# Patient Record
Sex: Female | Born: 1935 | Race: White | Hispanic: No | Smoking: Never smoker
Health system: Southern US, Community
[De-identification: ages and names within clinical notes are randomized; demographics above are authoritative.]

## PROBLEM LIST (undated history)

## (undated) DIAGNOSIS — E079 Disorder of thyroid, unspecified: Secondary | ICD-10-CM

## (undated) DIAGNOSIS — I10 Essential (primary) hypertension: Secondary | ICD-10-CM

## (undated) HISTORY — PX: AORTIC VALVE REPAIR: SHX6306

## (undated) HISTORY — PX: ABDOMINAL HYSTERECTOMY: SHX81

## (undated) HISTORY — PX: APPENDECTOMY: SHX54

---

## 2020-03-21 ENCOUNTER — Emergency Department: Payer: Medicare Other

## 2020-03-21 ENCOUNTER — Inpatient Hospital Stay
Admission: AD | Admit: 2020-03-21 | Discharge: 2020-04-02 | DRG: 956 | Disposition: A | Discharge status: Skilled Nursing Facility | Payer: Medicare Other | Attending: Internal Medicine | Admitting: Internal Medicine

## 2020-03-21 ENCOUNTER — Other Ambulatory Visit: Payer: Self-pay

## 2020-03-21 DIAGNOSIS — I5031 Acute diastolic (congestive) heart failure: Secondary | ICD-10-CM | POA: Diagnosis not present

## 2020-03-21 DIAGNOSIS — R338 Other retention of urine: Secondary | ICD-10-CM | POA: Diagnosis not present

## 2020-03-21 DIAGNOSIS — E43 Unspecified severe protein-calorie malnutrition: Secondary | ICD-10-CM | POA: Diagnosis present

## 2020-03-21 DIAGNOSIS — Z20822 Contact with and (suspected) exposure to covid-19: Secondary | ICD-10-CM | POA: Diagnosis present

## 2020-03-21 DIAGNOSIS — N179 Acute kidney failure, unspecified: Secondary | ICD-10-CM | POA: Diagnosis not present

## 2020-03-21 DIAGNOSIS — N184 Chronic kidney disease, stage 4 (severe): Secondary | ICD-10-CM | POA: Diagnosis present

## 2020-03-21 DIAGNOSIS — I4891 Unspecified atrial fibrillation: Secondary | ICD-10-CM | POA: Diagnosis present

## 2020-03-21 DIAGNOSIS — R140 Abdominal distension (gaseous): Secondary | ICD-10-CM

## 2020-03-21 DIAGNOSIS — S32592A Other specified fracture of left pubis, initial encounter for closed fracture: Secondary | ICD-10-CM | POA: Diagnosis present

## 2020-03-21 DIAGNOSIS — R627 Adult failure to thrive: Secondary | ICD-10-CM | POA: Diagnosis present

## 2020-03-21 DIAGNOSIS — D72829 Elevated white blood cell count, unspecified: Secondary | ICD-10-CM | POA: Diagnosis not present

## 2020-03-21 DIAGNOSIS — Z79899 Other long term (current) drug therapy: Secondary | ICD-10-CM

## 2020-03-21 DIAGNOSIS — I5033 Acute on chronic diastolic (congestive) heart failure: Secondary | ICD-10-CM | POA: Diagnosis present

## 2020-03-21 DIAGNOSIS — R778 Other specified abnormalities of plasma proteins: Secondary | ICD-10-CM | POA: Diagnosis not present

## 2020-03-21 DIAGNOSIS — E039 Hypothyroidism, unspecified: Secondary | ICD-10-CM | POA: Diagnosis present

## 2020-03-21 DIAGNOSIS — Z7901 Long term (current) use of anticoagulants: Secondary | ICD-10-CM

## 2020-03-21 DIAGNOSIS — S72001A Fracture of unspecified part of neck of right femur, initial encounter for closed fracture: Secondary | ICD-10-CM | POA: Diagnosis not present

## 2020-03-21 DIAGNOSIS — E079 Disorder of thyroid, unspecified: Secondary | ICD-10-CM | POA: Diagnosis present

## 2020-03-21 DIAGNOSIS — G9341 Metabolic encephalopathy: Secondary | ICD-10-CM | POA: Diagnosis not present

## 2020-03-21 DIAGNOSIS — Z90710 Acquired absence of both cervix and uterus: Secondary | ICD-10-CM

## 2020-03-21 DIAGNOSIS — R8281 Pyuria: Secondary | ICD-10-CM | POA: Diagnosis not present

## 2020-03-21 DIAGNOSIS — Z515 Encounter for palliative care: Secondary | ICD-10-CM | POA: Diagnosis not present

## 2020-03-21 DIAGNOSIS — I130 Hypertensive heart and chronic kidney disease with heart failure and stage 1 through stage 4 chronic kidney disease, or unspecified chronic kidney disease: Secondary | ICD-10-CM | POA: Diagnosis present

## 2020-03-21 DIAGNOSIS — S7012XA Contusion of left thigh, initial encounter: Secondary | ICD-10-CM | POA: Diagnosis not present

## 2020-03-21 DIAGNOSIS — Z419 Encounter for procedure for purposes other than remedying health state, unspecified: Secondary | ICD-10-CM

## 2020-03-21 DIAGNOSIS — N189 Chronic kidney disease, unspecified: Secondary | ICD-10-CM | POA: Diagnosis not present

## 2020-03-21 DIAGNOSIS — I272 Pulmonary hypertension, unspecified: Secondary | ICD-10-CM | POA: Diagnosis not present

## 2020-03-21 DIAGNOSIS — W010XXA Fall on same level from slipping, tripping and stumbling without subsequent striking against object, initial encounter: Secondary | ICD-10-CM | POA: Diagnosis present

## 2020-03-21 DIAGNOSIS — Z953 Presence of xenogenic heart valve: Secondary | ICD-10-CM

## 2020-03-21 DIAGNOSIS — R0902 Hypoxemia: Secondary | ICD-10-CM

## 2020-03-21 DIAGNOSIS — I4819 Other persistent atrial fibrillation: Secondary | ICD-10-CM | POA: Diagnosis present

## 2020-03-21 DIAGNOSIS — I10 Essential (primary) hypertension: Secondary | ICD-10-CM | POA: Diagnosis present

## 2020-03-21 DIAGNOSIS — R339 Retention of urine, unspecified: Secondary | ICD-10-CM | POA: Diagnosis not present

## 2020-03-21 DIAGNOSIS — I447 Left bundle-branch block, unspecified: Secondary | ICD-10-CM | POA: Diagnosis present

## 2020-03-21 DIAGNOSIS — S72141A Displaced intertrochanteric fracture of right femur, initial encounter for closed fracture: Principal | ICD-10-CM | POA: Diagnosis present

## 2020-03-21 DIAGNOSIS — S7011XA Contusion of right thigh, initial encounter: Secondary | ICD-10-CM | POA: Diagnosis not present

## 2020-03-21 DIAGNOSIS — K5900 Constipation, unspecified: Secondary | ICD-10-CM | POA: Diagnosis not present

## 2020-03-21 DIAGNOSIS — Y92009 Unspecified place in unspecified non-institutional (private) residence as the place of occurrence of the external cause: Secondary | ICD-10-CM

## 2020-03-21 DIAGNOSIS — S72141D Displaced intertrochanteric fracture of right femur, subsequent encounter for closed fracture with routine healing: Secondary | ICD-10-CM | POA: Diagnosis not present

## 2020-03-21 DIAGNOSIS — Z66 Do not resuscitate: Secondary | ICD-10-CM | POA: Diagnosis present

## 2020-03-21 DIAGNOSIS — Z7189 Other specified counseling: Secondary | ICD-10-CM | POA: Diagnosis not present

## 2020-03-21 DIAGNOSIS — I350 Nonrheumatic aortic (valve) stenosis: Secondary | ICD-10-CM | POA: Diagnosis present

## 2020-03-21 DIAGNOSIS — F439 Reaction to severe stress, unspecified: Secondary | ICD-10-CM | POA: Diagnosis not present

## 2020-03-21 DIAGNOSIS — I5043 Acute on chronic combined systolic (congestive) and diastolic (congestive) heart failure: Secondary | ICD-10-CM | POA: Diagnosis not present

## 2020-03-21 DIAGNOSIS — D62 Acute posthemorrhagic anemia: Secondary | ICD-10-CM | POA: Diagnosis not present

## 2020-03-21 DIAGNOSIS — W19XXXA Unspecified fall, initial encounter: Secondary | ICD-10-CM

## 2020-03-21 DIAGNOSIS — R54 Age-related physical debility: Secondary | ICD-10-CM | POA: Diagnosis present

## 2020-03-21 DIAGNOSIS — M549 Dorsalgia, unspecified: Secondary | ICD-10-CM | POA: Diagnosis present

## 2020-03-21 DIAGNOSIS — I480 Paroxysmal atrial fibrillation: Secondary | ICD-10-CM | POA: Diagnosis not present

## 2020-03-21 DIAGNOSIS — D6489 Other specified anemias: Secondary | ICD-10-CM | POA: Diagnosis not present

## 2020-03-21 DIAGNOSIS — Z6822 Body mass index (BMI) 22.0-22.9, adult: Secondary | ICD-10-CM

## 2020-03-21 DIAGNOSIS — R059 Cough, unspecified: Secondary | ICD-10-CM

## 2020-03-21 HISTORY — DX: Disorder of thyroid, unspecified: E07.9

## 2020-03-21 HISTORY — DX: Essential (primary) hypertension: I10

## 2020-03-21 LAB — SURGICAL PCR SCREEN
MRSA, PCR: NEGATIVE
Staphylococcus aureus: NEGATIVE

## 2020-03-21 LAB — BASIC METABOLIC PANEL
Anion gap: 10 (ref 5–15)
BUN: 33 mg/dL — ABNORMAL HIGH (ref 8–23)
CO2: 24 mmol/L (ref 22–32)
Calcium: 9.2 mg/dL (ref 8.9–10.3)
Chloride: 106 mmol/L (ref 98–111)
Creatinine, Ser: 0.98 mg/dL (ref 0.44–1.00)
GFR calc Af Amer: 57 mL/min — ABNORMAL LOW (ref >60)
GFR calc non Af Amer: 49 mL/min — ABNORMAL LOW (ref >60)
Glucose, Bld: 143 mg/dL — ABNORMAL HIGH (ref 70–99)
Potassium: 4.1 mmol/L (ref 3.5–5.1)
Sodium: 140 mmol/L (ref 135–145)

## 2020-03-21 LAB — SAMPLE TO BLOOD BANK

## 2020-03-21 LAB — CBC
HCT: 44.2 % (ref 36.0–46.0)
Hemoglobin: 14.9 g/dL (ref 12.0–15.0)
MCH: 31.6 pg (ref 26.0–34.0)
MCHC: 33.7 g/dL (ref 30.0–36.0)
MCV: 93.8 fL (ref 80.0–100.0)
Platelets: 279 10*3/uL (ref 150–400)
RBC: 4.71 MIL/uL (ref 3.87–5.11)
RDW: 15.8 % — ABNORMAL HIGH (ref 11.5–15.5)
WBC: 18 10*3/uL — ABNORMAL HIGH (ref 4.0–10.5)
nRBC: 0 % (ref 0.0–0.2)

## 2020-03-21 LAB — SARS CORONAVIRUS 2 BY RT PCR (HOSPITAL ORDER, PERFORMED IN ~~LOC~~ HOSPITAL LAB): SARS Coronavirus 2: NEGATIVE

## 2020-03-21 MED ORDER — CALCIUM ACETATE (PHOS BINDER) 667 MG PO CAPS
667.0000 mg | ORAL_CAPSULE | Freq: Two times a day (BID) | ORAL | Status: DC
  Administered 2020-03-21 – 2020-04-02 (×18): 667 mg via ORAL
  Filled 2020-03-21 (×26): qty 1

## 2020-03-21 MED ORDER — ACETAMINOPHEN 500 MG PO TABS
500.0000 mg | ORAL_TABLET | Freq: Four times a day (QID) | ORAL | Status: DC | PRN
  Administered 2020-03-22: 500 mg via ORAL
  Filled 2020-03-21: qty 1

## 2020-03-21 MED ORDER — MUPIROCIN 2 % EX OINT
1.0000 "application " | TOPICAL_OINTMENT | Freq: Two times a day (BID) | CUTANEOUS | Status: DC
  Filled 2020-03-21: qty 22

## 2020-03-21 MED ORDER — METOPROLOL SUCCINATE ER 50 MG PO TB24
50.0000 mg | ORAL_TABLET | Freq: Every day | ORAL | Status: DC
  Administered 2020-03-21 – 2020-03-26 (×6): 50 mg via ORAL
  Filled 2020-03-21 (×6): qty 1

## 2020-03-21 MED ORDER — MORPHINE SULFATE (PF) 2 MG/ML IV SOLN: 0.5000 mg | INTRAVENOUS | Status: DC | PRN

## 2020-03-21 MED ORDER — HYDROCODONE-ACETAMINOPHEN 5-325 MG PO TABS: 1.0000 | ORAL_TABLET | Freq: Four times a day (QID) | ORAL | Status: DC | PRN

## 2020-03-21 MED ORDER — LORATADINE 10 MG PO TABS
10.0000 mg | ORAL_TABLET | Freq: Every day | ORAL | Status: DC
  Administered 2020-03-21 – 2020-03-26 (×5): 10 mg via ORAL
  Filled 2020-03-21 (×5): qty 1

## 2020-03-21 MED ORDER — FENTANYL CITRATE (PF) 100 MCG/2ML IJ SOLN
25.0000 ug | INTRAMUSCULAR | Status: DC | PRN
  Administered 2020-03-21: 25 ug via INTRAVENOUS
  Filled 2020-03-21: qty 2

## 2020-03-21 MED ORDER — LOSARTAN POTASSIUM 50 MG PO TABS
100.0000 mg | ORAL_TABLET | Freq: Every day | ORAL | Status: DC
  Administered 2020-03-21: 100 mg via ORAL
  Filled 2020-03-21: qty 2

## 2020-03-21 MED ORDER — ADULT MULTIVITAMIN W/MINERALS CH
1.0000 | ORAL_TABLET | Freq: Every day | ORAL | Status: DC
  Administered 2020-03-21: 1 via ORAL
  Filled 2020-03-21: qty 1

## 2020-03-21 MED ORDER — CEFAZOLIN SODIUM-DEXTROSE 1-4 GM/50ML-% IV SOLN
1.0000 g | Freq: Once | INTRAVENOUS | Status: AC
  Administered 2020-03-21: 1 g via INTRAVENOUS
  Filled 2020-03-21: qty 50

## 2020-03-21 MED ORDER — LEVOTHYROXINE SODIUM 50 MCG PO TABS
75.0000 ug | ORAL_TABLET | Freq: Every day | ORAL | Status: DC
  Administered 2020-03-22 – 2020-04-02 (×12): 75 ug via ORAL
  Filled 2020-03-21 (×4): qty 1
  Filled 2020-03-21: qty 2
  Filled 2020-03-21 (×7): qty 1

## 2020-03-21 MED ORDER — POLYETHYLENE GLYCOL 3350 17 G PO PACK: 17.0000 g | PACK | Freq: Every day | ORAL | Status: DC | PRN

## 2020-03-21 NOTE — ED Provider Notes (Signed)
[facility] Emergency Department Provider Note  Time seen: 8:25 AM  I have reviewed the triage vital signs and the nursing notes.   HISTORY  Chief Complaint Fall   HPI Rebecca Cruz is a 86 y.o. female with a past medical history of hypertension presents to the emergency department for right hip pain.  According to the patient she tripped and fell last night.  Was able to get the bed, but this morning was unable to get up due to pain unable to bear weight so the patient came to the emergency department for evaluation.  Patient does not believe she hit her head she does states she is on Eliquis.  She is currently alert and oriented.  Well-appearing with no other medical complaints besides right hip pain.   Past Medical History:  Diagnosis Date  . Hypertension   . Thyroid disease     There are no problems to display for this patient.   Past Surgical History:  Procedure Laterality Date  . ABDOMINAL HYSTERECTOMY    . AORTIC VALVE REPAIR    . APPENDECTOMY      Prior to Admission medications   Not on File    No Known Allergies  History reviewed. No pertinent family history.  Social History Social History   Tobacco Use  . Smoking status: Never Smoker  Substance Use Topics  . Alcohol use: Yes    Comment: once a week   . Drug use: Not on file    Review of Systems Constitutional: Negative for fever. Cardiovascular: Negative for chest pain. Respiratory: Negative for shortness of breath. Gastrointestinal: Negative for abdominal pain Musculoskeletal: Moderate aching pain in the right hip worse with any movement. Neurological: Negative for headache All other ROS negative  ____________________________________________   PHYSICAL EXAM:  VITAL SIGNS: ED Triage Vitals  Enc Vitals Group     BP 03/21/20 0803 137/90     Pulse Rate 03/21/20 0803 97     Resp 03/21/20 0803 13     Temp 03/21/20 0816 98 F (36.7 C)     Temp Source 03/21/20 0816  Oral     SpO2 03/21/20 0803 92 %     Weight 03/21/20 0808 113 lb 9.6 oz (51.5 kg)     Height 03/21/20 0808 5' (1.524 m)     Head Circumference --      Peak Flow --      Pain Score 03/21/20 0816 0     Pain Loc --      Pain Edu? --      Excl. in [place]? --     Constitutional: Alert and oriented. Well appearing and in no distress. Eyes: Normal exam ENT      Head: Normocephalic and atraumatic.      Mouth/Throat: Mucous membranes are moist. Cardiovascular: Normal rate, regular rhythm.  Respiratory: Normal respiratory effort without tachypnea nor retractions. Breath sounds are clear Gastrointestinal: Soft and nontender. No distention.   Musculoskeletal: Moderate right hip tenderness to palpation.  Moderate pain with any attempted range of motion.  2+ DP pulse.  Sensation intact. Neurologic:  Normal speech and language. No gross focal neurologic deficits Skin:  Skin is warm, dry and intact.  Psychiatric: Mood and affect are normal.  ____________________________________________    EKG  EKG viewed and interpreted by myself shows what appears to be a sinus rhythm around 79 bpm with a widened QRS, normal axis normal intervals nonspecific ST changes without obvious ST elevation.  ____________________________________________  RADIOLOGY  X-ray shows right intertrochanteric fracture with a left inferior pubic ramus fracture possible. CT had negative for acute abnormality.  ____________________________________________   INITIAL IMPRESSION / ASSESSMENT AND PLAN / ED COURSE  Pertinent labs & imaging results that were available during my care of the patient were reviewed by me and considered in my medical decision making (see chart for details).   Patient presents to the emergency department for a fall with right hip pain.  Exam is concerning for likely hip fracture.  We will check x-rays of the hip pelvis and femur.  Given the fall and anticoagulation we will obtain a CT scan of the head.   We will check labs and continue to closely monitor.  We will dose small doses of fentanyl for pain control as needed.  Patient's work-up consistent with right intertrochanteric fracture.  Spoke to orthopedics Dr. Posey Pronto will operate tomorrow as the patient is on Eliquis.  We will place n.p.o. after midnight order.  Spoke with the hospitalist who will admit the patient to their service.  Rebecca Cruz was evaluated in Emergency Department on 03/21/2020 for the symptoms described in the history of present illness. She was evaluated in the context of the global COVID-19 pandemic, which necessitated consideration that the patient might be at risk for infection with the SARS-CoV-2 virus that causes COVID-19. Institutional protocols and algorithms that pertain to the evaluation of patients at risk for COVID-19 are in a state of rapid change based on information released by regulatory bodies including the CDC and federal and state organizations. These policies and algorithms were followed during the patient's care in the ED.  ____________________________________________   FINAL CLINICAL IMPRESSION(S) / ED DIAGNOSES  Right hip pain   Harvest Dark, MD 03/21/20 0945

## 2020-03-21 NOTE — ED Notes (Signed)
Pt in xray

## 2020-03-21 NOTE — Progress Notes (Signed)
Pt's golden necklace with cross pendant was given to niece Tye [place].  Medications was sent home.

## 2020-03-21 NOTE — Consult Note (Addendum)
ORTHOPAEDIC CONSULTATION  REQUESTING PHYSICIAN: Collier Bullock, MD  Chief Complaint:   R hip pain  History of Present Illness: Rebecca Cruz is a 86 y.o. female who had a fall at her niece's house yesterday. She normally lives in [place] with her son, but was visiting her niece. She was able to get to bed, but could not get out of bed due to pain this morning. The patient ambulates with a walker at baseline.  Pain is described as sharp at its worst and a dull ache at its best.  Pain is rated a 10 out of 10 in severity.  Pain is improved with rest and immobilization.  Pain is worse with any sort of movement.  X-rays in the emergency department show a right intertrochanteric hip fracture.  Of note, she takes Eliquis bid for atrial fibrillation with last dose occurring last night.   Past Medical History:  Diagnosis Date  . Hypertension   . Thyroid disease    Past Surgical History:  Procedure Laterality Date  . ABDOMINAL HYSTERECTOMY    . AORTIC VALVE REPAIR    . APPENDECTOMY     Social History   Socioeconomic History  . Marital status: Widowed    Spouse name: Not on file  . Number of children: Not on file  . Years of education: Not on file  . Highest education level: Not on file  Occupational History  . Not on file  Tobacco Use  . Smoking status: Never Smoker  Substance and Sexual Activity  . Alcohol use: Yes    Comment: once a week   . Drug use: Not on file  . Sexual activity: Not on file  Other Topics Concern  . Not on file  Social History Narrative  . Not on file   Social Determinants of Health   Financial Resource Strain:   . Difficulty of Paying Living Expenses:   Food Insecurity:   . Worried About Charity fundraiser in the Last Year:   . Arboriculturist in the Last Year:   Transportation Needs:   . Film/video editor (Medical):   Marland Kitchen Lack of Transportation (Non-Medical):   Physical  Activity:   . Days of Exercise per Week:   . Minutes of Exercise per Session:   Stress:   . Feeling of Stress :   Social Connections:   . Frequency of Communication with Friends and Family:   . Frequency of Social Gatherings with Friends and Family:   . Attends Religious Services:   . Active Member of Clubs or Organizations:   . Attends Archivist Meetings:   Marland Kitchen Marital Status:    History reviewed. No pertinent family history. No Known Allergies Prior to Admission medications   Medication Sig Start Date End Date Taking? Authorizing Provider  cetirizine (ZYRTEC) 10 MG tablet Take 10 mg by mouth daily at 6 (six) AM. 08/25/17  Yes [provider]  acetaminophen (TYLENOL) 500 MG tablet Take 500 mg by mouth every 6 (six) hours as needed for headache.    [provider]  calcium acetate (PHOSLO) 667 MG capsule Take 667 mg by mouth 2 (two) times daily.    [provider]  ELIQUIS 2.5 MG TABS tablet Take 2.5 mg by mouth 2 (two) times daily. 03/01/20   [provider]  levothyroxine (SYNTHROID) 75 MCG tablet Take 75 mcg by mouth daily. 03/01/20   [provider]  losartan (COZAAR) 100 MG tablet Take 100 mg by mouth  daily. 03/12/20   [provider]  metoprolol succinate (TOPROL-XL) 50 MG 24 hr tablet Take 50 mg by mouth daily. 03/01/20   [provider]  Multiple Vitamin (MULTIVITAMIN) capsule Take 1 capsule by mouth daily.    [provider]   Recent Labs    03/21/20 0806  WBC 18.0*  HGB 14.9  HCT 44.2  PLT 279  K 4.1  CL 106  CO2 24  BUN 33*  CREATININE 0.98  GLUCOSE 143*  CALCIUM 9.2   DG Chest 1 View  Result Date: 03/21/2020 CLINICAL DATA:  Preoperative study EXAM: CHEST  1 VIEW COMPARISON:  None. FINDINGS: Cardiomegaly. Diffuse interstitial opacities and mild Kerley B lines. No pneumothorax. No nodules or masses. No focal infiltrates. IMPRESSION: Cardiomegaly and mild edema. Electronically Signed   By:  Dorise Bullion III M.D   On: 03/21/2020 09:09   DG Pelvis 1-2 Views  Result Date: 03/21/2020 CLINICAL DATA:  Pain after fall EXAM: PELVIS - 1-2 VIEW COMPARISON:  None. FINDINGS: A comminuted displaced fracture through the right femoral intertrochanteric region is identified. A nondisplaced left inferior pubic ramus fracture is not excluded. No other bony abnormalities in the pelvic bones. Vascular calcifications. IMPRESSION: 1. Displaced comminuted fracture through the right femoral intertrochanteric region. A nondisplaced fracture through the right inferior pubic ramus is not excluded. Electronically Signed   By: Dorise Bullion III M.D   On: 03/21/2020 09:12   CT Head Wo Contrast  Result Date: 03/21/2020 CLINICAL DATA:  Pt arrives [facility] from neices home. Pt visiting from [place]. Uses cane/walked. States she lost her balance and fell on floor inside house. Swelling noted to R upper thigh. Internal rotation to R leg. Shortening as well. A&. TKV EXAM: CT HEAD WITHOUT CONTRAST TECHNIQUE: Contiguous axial images were obtained from the base of the skull through the vertex without intravenous contrast. COMPARISON:  None. FINDINGS: Brain: No evidence of acute infarction, hemorrhage, hydrocephalus, extra-axial collection or mass lesion/mass effect. There is ventricular sulcal enlargement reflecting mild diffuse atrophy. Patchy bilateral white matter hypoattenuation is also noted consistent with chronic microvascular ischemic change. Vascular: No hyperdense vessel or unexpected calcification. Skull: Normal. Negative for fracture or focal lesion. Sinuses/Orbits: Visualized globes and orbits are unremarkable. The visualized sinuses are clear. Other: There is a small amount of soft tissue air below the skull base, projecting adjacent to the right pterygoid plates, posterior to the right maxillary sinus and in the infratemporal fossa. The etiology of this is unclear. IMPRESSION: 1. No acute intracranial  abnormalities. 2. Mild atrophy and moderate chronic microvascular ischemic change. 3. Small amount of soft tissue air at the right skull base as detailed above. Consider a facial fracture, follow-up facial CT, if there is facial trauma. Electronically Signed   By: Lajean Manes M.D.   On: 03/21/2020 09:14   DG Femur Min 2 Views Right  Result Date: 03/21/2020 CLINICAL DATA:  Preoperative study EXAM: RIGHT FEMUR 2 VIEWS COMPARISON:  None. FINDINGS: A displaced comminuted intertrochanteric fracture seen in the right hip. The remainder of the femur is intact. The proximal tibia and fibula are intact. No femoral head dislocation. A fracture through the left inferior pubic ramus is not excluded. No other acute abnormalities. IMPRESSION: 1. The patient has a displaced comminuted fracture through the right femoral intertrochanteric region without dislocation. 2. A nondisplaced left inferior pubic ramus fracture is not excluded. 3. No other abnormalities. Electronically Signed   By: Dorise Bullion III M.D   On: 03/21/2020 09:11  Positive ROS: All other systems have been reviewed and were otherwise negative with the exception of those mentioned in the HPI and as above.  Physical Exam: BP (!) 128/59   Pulse 81   Temp 98 F (36.7 C) (Oral)   Resp 17   Ht 5' (1.524 m)   Wt 51.5 kg   SpO2 93%   BMI 22.19 kg/m  General:  Alert, no acute distress Psychiatric:  Patient is competent for consent with normal mood and affect   Cardiovascular:  No pedal edema, irregular rate and rhythm Respiratory:  No wheezing, non-labored breathing GI:  Abdomen is soft and non-tender Skin:  No lesions in the area of chief complaint, no erythema Neurologic:  Sensation intact distally, CN grossly intact Lymphatic:  No axillary or cervical lymphadenopathy  Orthopedic Exam:  RLE: 5/5 DF/PF/EHL SILT s/s/t/sp/dp distr Foot wwp +Log roll/axial load   X-rays:  As above: R intertrochanteric hip fracture, possible right  inferior pubic ramus fracture  Assessment/Plan: Aneliese Beaudry is a 85 y.o. female with a R intertrochanteric hip fracture and possible right inferior pubic ramus fracture.    1. I discussed the various treatment options including both surgical and non-surgical management of the inertrochanteric fracture with the patient and her family. We discussed the high risk of perioperative complications due to patient's age and other co-morbidities. After discussion of risks, benefits, and alternatives to surgery, the family and patient were in agreement to proceed with surgery. The goals of surgery would be to provide adequate pain relief and allow for mobilization. Plan for surgery is R hip cephalomedullary nailing tomorrow, 03/22/20, given last dose of Eliquis was yesterday evening. We will additionally plan to treat possible right inferior pubic ramus fracture non-operatively as there are no other signs of significant pelvic ring injury.  2. NPO after midnight 3. Hold anticoagulation in advance of OR 4. Admit to Hospitalist service   ADDENDUM: I have also discussed plan of care with the patient's son, Rebecca Cruz, who is healthcare PoA. Phone number [phone]. We agreed to proceed as planned above. All questions answered.    Leim Fabry   03/21/2020 10:22 AM

## 2020-03-21 NOTE — H&P (Signed)
History and Physical    Tyrica Afzal YKZ:[id number] DOB: 1923-11-20 DOA: 03/21/2020  PCP: Patient, No Pcp Per   Patient coming from: Home  I have personally briefly reviewed patient's old medical records in [place]  Chief Complaint: S/p Fall  HPI: Rebecca Cruz is a 86 y.o. female with medical history significant for hypertension, hypothyroidism, stage IV chronic kidney disease and atrial fibrillation on chronic anticoagulation therapy who is visiting family in the area. She was brought in to the ER by EMS for evaluation of right hip pain. Patient ambulates with a walker and stated that she lost her balance and fell on the floor inside the house. She was able to get up and get back in bed but this morning was unable to get out of bed.  She is noted to have swelling in the right upper thigh as well as shortening and internal rotation of her right leg. She denies having any loss of consciousness, she denies feeling dizzy or lightheaded, no cough, no fever, no chills, no nausea, no vomiting, no abdominal pain or any changes in her bowel habits. Right femur x-ray shows a displaced comminuted fracture through the right femoral intertrochanteric region without dislocation. A nondisplaced left inferior pubic ramus fracture is not excluded. Chest x-ray shows cardiomegaly and mild edema. Patient had a CT scan of the head done without contrast which showed no acute intracranial abnormalities. Mild atrophy and moderate chronic microvascular ischemic change. Small amount of soft tissue air at the right skull base as detailed above. Consider a facial fracture, follow-up facial CT, if there is facial trauma. Patient is awake and alert and denies hitting her head when she fell. Twelve-lead EKG shows A. fib with an incomplete left bundle branch block   ED Course: Patient is a 86 year old female who was brought into the emergency room by EMS after she fell at home. Patient has a  displaced  comminuted fracture through the right femoral intertrochanteric region without dislocation. A nondisplaced left inferior pubic ramus fracture is not excluded. Orthopedic consult has been requested. Patient will be admitted to the hospital for further evaluation.   Review of Systems: As per HPI otherwise 10 point review of systems negative.    Past Medical History:  Diagnosis Date  . Hypertension   . Thyroid disease     Past Surgical History:  Procedure Laterality Date  . ABDOMINAL HYSTERECTOMY    . AORTIC VALVE REPAIR    . APPENDECTOMY       reports that she has never smoked. She does not have any smokeless tobacco history on file. She reports current alcohol use. No history on file for drug use.  No Known Allergies  History reviewed. No pertinent family history.   Prior to Admission medications   Not on File    Physical Exam: Vitals:   03/21/20 0815 03/21/20 0816 03/21/20 0918 03/21/20 0930  BP: (!) 155/71  137/80 120/66  Pulse:   75 81  Resp: 17  17 17   Temp:  98 F (36.7 C)    TempSrc:  Oral    SpO2:   92% 93%  Weight:      Height:         Vitals:   03/21/20 0815 03/21/20 0816 03/21/20 0918 03/21/20 0930  BP: (!) 155/71  137/80 120/66  Pulse:   75 81  Resp: 17  17 17   Temp:  98 F (36.7 C)    TempSrc:  Oral    SpO2:  92% 93%  Weight:      Height:        Constitutional: NAD, alert and oriented x 3.  Appears comfortable and in no distress, 10 Eyes: PERRL, lids and conjunctivae normal ENMT: Mucous membranes are moist.  Neck: normal, supple, no masses, no thyromegaly Respiratory: clear to auscultation bilaterally, no wheezing, no crackles. Normal respiratory effort. No accessory muscle use.  Cardiovascular: Irregularly irregular, no murmurs / rubs / gallops. No extremity edema. 2+ pedal pulses. No carotid bruits.  Abdomen: no tenderness, no masses palpated. No hepatosplenomegaly. Bowel sounds positive.  Musculoskeletal: no clubbing / cyanosis.   Decreased range of motion right hip Skin: no rashes, lesions, ecchymosis both forearms Neurologic: No gross focal neurologic deficit. Psychiatric: Normal mood and affect.   Labs on Admission: I have personally reviewed following labs and imaging studies  CBC: Recent Labs  Lab 03/21/20 0806  WBC 18.0*  HGB 14.9  HCT 44.2  MCV 93.8  PLT 419   Basic Metabolic Panel: Recent Labs  Lab 03/21/20 0806  NA 140  K 4.1  CL 106  CO2 24  GLUCOSE 143*  BUN 33*  CREATININE 0.98  CALCIUM 9.2   GFR: Estimated Creatinine Clearance: 25.2 mL/min (by C-G formula based on SCr of 0.98 mg/dL). Liver Function Tests: No results for input(s): AST, ALT, ALKPHOS, BILITOT, PROT, ALBUMIN in the last 168 hours. No results for input(s): LIPASE, AMYLASE in the last 168 hours. No results for input(s): AMMONIA in the last 168 hours. Coagulation Profile: No results for input(s): INR, PROTIME in the last 168 hours. Cardiac Enzymes: No results for input(s): CKTOTAL, CKMB, CKMBINDEX, TROPONINI in the last 168 hours. BNP (last 3 results) No results for input(s): PROBNP in the last 8760 hours. HbA1C: No results for input(s): HGBA1C in the last 72 hours. CBG: No results for input(s): GLUCAP in the last 168 hours. Lipid Profile: No results for input(s): CHOL, HDL, LDLCALC, TRIG, CHOLHDL, LDLDIRECT in the last 72 hours. Thyroid Function Tests: No results for input(s): TSH, T4TOTAL, FREET4, T3FREE, THYROIDAB in the last 72 hours. Anemia Panel: No results for input(s): VITAMINB12, FOLATE, FERRITIN, TIBC, IRON, RETICCTPCT in the last 72 hours. Urine analysis: No results found for: COLORURINE, APPEARANCEUR, LABSPEC, PHURINE, GLUCOSEU, HGBUR, BILIRUBINUR, KETONESUR, PROTEINUR, UROBILINOGEN, NITRITE, LEUKOCYTESUR  Radiological Exams on Admission: DG Chest 1 View  Result Date: 03/21/2020 CLINICAL DATA:  Preoperative study EXAM: CHEST  1 VIEW COMPARISON:  None. FINDINGS: Cardiomegaly. Diffuse interstitial  opacities and mild Kerley B lines. No pneumothorax. No nodules or masses. No focal infiltrates. IMPRESSION: Cardiomegaly and mild edema. Electronically Signed   By: Dorise Bullion III M.D   On: 03/21/2020 09:09   DG Pelvis 1-2 Views  Result Date: 03/21/2020 CLINICAL DATA:  Pain after fall EXAM: PELVIS - 1-2 VIEW COMPARISON:  None. FINDINGS: A comminuted displaced fracture through the right femoral intertrochanteric region is identified. A nondisplaced left inferior pubic ramus fracture is not excluded. No other bony abnormalities in the pelvic bones. Vascular calcifications. IMPRESSION: 1. Displaced comminuted fracture through the right femoral intertrochanteric region. A nondisplaced fracture through the right inferior pubic ramus is not excluded. Electronically Signed   By: Dorise Bullion III M.D   On: 03/21/2020 09:12   CT Head Wo Contrast  Result Date: 03/21/2020 CLINICAL DATA:  Pt arrives [facility] from neices home. Pt visiting from [place]. Uses cane/walked. States she lost her balance and fell on floor inside house. Swelling noted to R upper thigh. Internal rotation to R leg. Shortening  as well. A&. TKV EXAM: CT HEAD WITHOUT CONTRAST TECHNIQUE: Contiguous axial images were obtained from the base of the skull through the vertex without intravenous contrast. COMPARISON:  None. FINDINGS: Brain: No evidence of acute infarction, hemorrhage, hydrocephalus, extra-axial collection or mass lesion/mass effect. There is ventricular sulcal enlargement reflecting mild diffuse atrophy. Patchy bilateral white matter hypoattenuation is also noted consistent with chronic microvascular ischemic change. Vascular: No hyperdense vessel or unexpected calcification. Skull: Normal. Negative for fracture or focal lesion. Sinuses/Orbits: Visualized globes and orbits are unremarkable. The visualized sinuses are clear. Other: There is a small amount of soft tissue air below the skull base, projecting adjacent to the right  pterygoid plates, posterior to the right maxillary sinus and in the infratemporal fossa. The etiology of this is unclear. IMPRESSION: 1. No acute intracranial abnormalities. 2. Mild atrophy and moderate chronic microvascular ischemic change. 3. Small amount of soft tissue air at the right skull base as detailed above. Consider a facial fracture, follow-up facial CT, if there is facial trauma. Electronically Signed   By: Lajean Manes M.D.   On: 03/21/2020 09:14   DG Femur Min 2 Views Right  Result Date: 03/21/2020 CLINICAL DATA:  Preoperative study EXAM: RIGHT FEMUR 2 VIEWS COMPARISON:  None. FINDINGS: A displaced comminuted intertrochanteric fracture seen in the right hip. The remainder of the femur is intact. The proximal tibia and fibula are intact. No femoral head dislocation. A fracture through the left inferior pubic ramus is not excluded. No other acute abnormalities. IMPRESSION: 1. The patient has a displaced comminuted fracture through the right femoral intertrochanteric region without dislocation. 2. A nondisplaced left inferior pubic ramus fracture is not excluded. 3. No other abnormalities. Electronically Signed   By: Dorise Bullion III M.D   On: 03/21/2020 09:11    EKG: Independently reviewed.  Atrial Fibrillation Incomplete LBBB  Assessment/Plan Principal Problem:   Intertrochanteric fracture of right femur [facility]) Active Problems:   Hypertension   Thyroid disease   Atrial fibrillation with RVR (HCC)   Intertrochanteric fracture of right femur, closed, initial encounter ([place])   Chronic kidney disease, stage 4 (severe) (HCC)     Intertrochanteric fracture of right femur Patient is status post fall and has a displaced comminuted fracture through the right femoral intertrochanteric region without dislocation. A nondisplaced left inferior pubic ramus fracture is not excluded. Immobilize right lower extremity Pain control We will request orthopedic evaluation     History of  atrial fibrillation Continue metoprolol for rate control Patient was on Eliquis as primary prophylaxis for an acute stroke We will hold Eliquis for planned procedure   Hypertension Blood pressure stable Continue metoprolol and losartan   Hypothyroidism Continue Synthroid   Chronic kidney disease, stage 4 Stable   Leukocytosis Most likely secondary to leukemoid reaction No obvious signs of an infectious process at this time Obtain urine  DVT prophylaxis: SCD Code Status: DO NOT RESUSCITATE Family Communication: Greater than 50% of time was spent discussing plan of care with patient and her niece at the bedside.  She verbalizes understanding and agrees with the plan.  All questions and concerns have been addressed. Disposition Plan: Back to previous home environment Consults called: Orthopedic surgery    Kashay Cavenaugh MD [facility]     03/21/2020, 10:17 AM

## 2020-03-21 NOTE — ED Triage Notes (Signed)
Pt arrives [facility] from [facility]ices home. Pt visiting from [place]. Uses cane/walked. States she lost her balance and fell on floor inside house. Swelling noted to R upper thigh. Internal rotation to R leg. Shortening as well. A&O.

## 2020-03-22 ENCOUNTER — Encounter: Payer: Self-pay | Admitting: Internal Medicine

## 2020-03-22 ENCOUNTER — Inpatient Hospital Stay: Payer: Medicare Other | Admitting: Anesthesiology

## 2020-03-22 ENCOUNTER — Encounter
Admission: AD | Disposition: A | Discharge status: Skilled Nursing Facility | Payer: Self-pay | Source: Home / Self Care | Attending: Internal Medicine

## 2020-03-22 ENCOUNTER — Inpatient Hospital Stay: Payer: Medicare Other

## 2020-03-22 DIAGNOSIS — E43 Unspecified severe protein-calorie malnutrition: Secondary | ICD-10-CM | POA: Insufficient documentation

## 2020-03-22 HISTORY — PX: INTRAMEDULLARY (IM) NAIL INTERTROCHANTERIC: SHX5875

## 2020-03-22 LAB — BASIC METABOLIC PANEL
Anion gap: 11 (ref 5–15)
BUN: 34 mg/dL — ABNORMAL HIGH (ref 8–23)
CO2: 23 mmol/L (ref 22–32)
Calcium: 8.8 mg/dL — ABNORMAL LOW (ref 8.9–10.3)
Chloride: 106 mmol/L (ref 98–111)
Creatinine, Ser: 0.93 mg/dL (ref 0.44–1.00)
GFR calc Af Amer: >60 mL/min (ref >60)
GFR calc non Af Amer: 53 mL/min — ABNORMAL LOW (ref >60)
Glucose, Bld: 123 mg/dL — ABNORMAL HIGH (ref 70–99)
Potassium: 3.9 mmol/L (ref 3.5–5.1)
Sodium: 140 mmol/L (ref 135–145)

## 2020-03-22 LAB — CBC
HCT: 34.2 % — ABNORMAL LOW (ref 36.0–46.0)
Hemoglobin: 11.2 g/dL — ABNORMAL LOW (ref 12.0–15.0)
MCH: 31.5 pg (ref 26.0–34.0)
MCHC: 32.7 g/dL (ref 30.0–36.0)
MCV: 96.1 fL (ref 80.0–100.0)
Platelets: 293 10*3/uL (ref 150–400)
RBC: 3.56 MIL/uL — ABNORMAL LOW (ref 3.87–5.11)
RDW: 15.6 % — ABNORMAL HIGH (ref 11.5–15.5)
WBC: 22.3 10*3/uL — ABNORMAL HIGH (ref 4.0–10.5)
nRBC: 0 % (ref 0.0–0.2)

## 2020-03-22 SURGERY — FIXATION, FRACTURE, INTERTROCHANTERIC, WITH INTRAMEDULLARY ROD
Anesthesia: General | Site: Hip | Laterality: Right

## 2020-03-22 MED ORDER — SENNOSIDES-DOCUSATE SODIUM 8.6-50 MG PO TABS: 1.0000 | ORAL_TABLET | Freq: Every evening | ORAL | Status: DC | PRN

## 2020-03-22 MED ORDER — SUCCINYLCHOLINE CHLORIDE 200 MG/10ML IV SOSY
PREFILLED_SYRINGE | INTRAVENOUS | Status: AC
  Filled 2020-03-22: qty 10

## 2020-03-22 MED ORDER — FENTANYL CITRATE (PF) 250 MCG/5ML IJ SOLN
INTRAMUSCULAR | Status: AC
  Filled 2020-03-22: qty 5

## 2020-03-22 MED ORDER — SUGAMMADEX SODIUM 500 MG/5ML IV SOLN
INTRAVENOUS | Status: DC | PRN
  Administered 2020-03-22: 200 mg via INTRAVENOUS

## 2020-03-22 MED ORDER — DEXAMETHASONE SODIUM PHOSPHATE 10 MG/ML IJ SOLN
INTRAMUSCULAR | Status: AC
  Filled 2020-03-22: qty 1

## 2020-03-22 MED ORDER — LIDOCAINE HCL (CARDIAC) PF 100 MG/5ML IV SOSY
PREFILLED_SYRINGE | INTRAVENOUS | Status: DC | PRN
  Administered 2020-03-22: 100 mg via INTRAVENOUS

## 2020-03-22 MED ORDER — SODIUM CHLORIDE 0.9 % IV SOLN: INTRAVENOUS | Status: DC

## 2020-03-22 MED ORDER — PROPOFOL 10 MG/ML IV BOLUS
INTRAVENOUS | Status: AC
  Filled 2020-03-22: qty 20

## 2020-03-22 MED ORDER — ONDANSETRON HCL 4 MG PO TABS
4.0000 mg | ORAL_TABLET | Freq: Four times a day (QID) | ORAL | Status: DC | PRN
  Administered 2020-03-31: 4 mg via ORAL
  Filled 2020-03-22: qty 1

## 2020-03-22 MED ORDER — NEOMYCIN-POLYMYXIN B GU 40-200000 IR SOLN
Status: AC
  Filled 2020-03-22: qty 4

## 2020-03-22 MED ORDER — DOCUSATE SODIUM 100 MG PO CAPS
100.0000 mg | ORAL_CAPSULE | Freq: Two times a day (BID) | ORAL | Status: DC
  Administered 2020-03-22 – 2020-04-02 (×21): 100 mg via ORAL
  Filled 2020-03-22 (×20): qty 1

## 2020-03-22 MED ORDER — METOCLOPRAMIDE HCL 5 MG/ML IJ SOLN: 5.0000 mg | Freq: Three times a day (TID) | INTRAMUSCULAR | Status: DC | PRN

## 2020-03-22 MED ORDER — ROCURONIUM BROMIDE 10 MG/ML (PF) SYRINGE
PREFILLED_SYRINGE | INTRAVENOUS | Status: AC
  Filled 2020-03-22: qty 10

## 2020-03-22 MED ORDER — ONDANSETRON HCL 4 MG/2ML IJ SOLN
4.0000 mg | Freq: Four times a day (QID) | INTRAMUSCULAR | Status: DC | PRN
  Administered 2020-03-27: 4 mg via INTRAVENOUS
  Filled 2020-03-22: qty 2

## 2020-03-22 MED ORDER — METOCLOPRAMIDE HCL 10 MG PO TABS: 5.0000 mg | ORAL_TABLET | Freq: Three times a day (TID) | ORAL | Status: DC | PRN

## 2020-03-22 MED ORDER — FLEET ENEMA 7-19 GM/118ML RE ENEM: 1.0000 | ENEMA | Freq: Once | RECTAL | Status: DC | PRN

## 2020-03-22 MED ORDER — LACTATED RINGERS IV SOLN: INTRAVENOUS | Status: DC

## 2020-03-22 MED ORDER — BISACODYL 10 MG RE SUPP
10.0000 mg | Freq: Every day | RECTAL | Status: DC | PRN
  Administered 2020-03-25: 10 mg via RECTAL
  Filled 2020-03-22: qty 1

## 2020-03-22 MED ORDER — ONDANSETRON HCL 4 MG/2ML IJ SOLN
INTRAMUSCULAR | Status: AC
  Filled 2020-03-22: qty 2

## 2020-03-22 MED ORDER — METHOCARBAMOL 500 MG PO TABS
500.0000 mg | ORAL_TABLET | Freq: Four times a day (QID) | ORAL | Status: DC | PRN
  Administered 2020-03-25 – 2020-04-01 (×4): 500 mg via ORAL
  Filled 2020-03-22 (×5): qty 1

## 2020-03-22 MED ORDER — CEFAZOLIN SODIUM-DEXTROSE 1-4 GM/50ML-% IV SOLN
INTRAVENOUS | Status: DC | PRN
  Administered 2020-03-22: 1 g via INTRAVENOUS

## 2020-03-22 MED ORDER — FENTANYL CITRATE (PF) 100 MCG/2ML IJ SOLN: 25.0000 ug | INTRAMUSCULAR | Status: DC | PRN

## 2020-03-22 MED ORDER — ROCURONIUM BROMIDE 100 MG/10ML IV SOLN
INTRAVENOUS | Status: DC | PRN
  Administered 2020-03-22: 50 mg via INTRAVENOUS

## 2020-03-22 MED ORDER — SODIUM CHLORIDE (PF) 0.9 % IJ SOLN
INTRAMUSCULAR | Status: AC
  Filled 2020-03-22: qty 50

## 2020-03-22 MED ORDER — BUPIVACAINE HCL (PF) 0.5 % IJ SOLN
INTRAMUSCULAR | Status: AC
  Filled 2020-03-22: qty 30

## 2020-03-22 MED ORDER — FENTANYL CITRATE (PF) 100 MCG/2ML IJ SOLN
INTRAMUSCULAR | Status: DC | PRN
  Administered 2020-03-22 (×2): 50 ug via INTRAVENOUS
  Administered 2020-03-22: 150 ug via INTRAVENOUS

## 2020-03-22 MED ORDER — ONDANSETRON HCL 4 MG/2ML IJ SOLN: 4.0000 mg | Freq: Once | INTRAMUSCULAR | Status: DC | PRN

## 2020-03-22 MED ORDER — ONDANSETRON HCL 4 MG/2ML IJ SOLN
INTRAMUSCULAR | Status: DC | PRN
  Administered 2020-03-22: 4 mg via INTRAVENOUS

## 2020-03-22 MED ORDER — PROPOFOL 10 MG/ML IV BOLUS
INTRAVENOUS | Status: DC | PRN
  Administered 2020-03-22: 50 mg via INTRAVENOUS

## 2020-03-22 MED ORDER — ACETAMINOPHEN 500 MG PO TABS
1000.0000 mg | ORAL_TABLET | Freq: Three times a day (TID) | ORAL | Status: AC
  Administered 2020-03-22 – 2020-03-23 (×4): 1000 mg via ORAL
  Filled 2020-03-22 (×4): qty 2

## 2020-03-22 MED ORDER — APIXABAN 2.5 MG PO TABS
2.5000 mg | ORAL_TABLET | Freq: Two times a day (BID) | ORAL | Status: DC
  Administered 2020-03-23 – 2020-03-25 (×5): 2.5 mg via ORAL
  Filled 2020-03-22 (×5): qty 1

## 2020-03-22 MED ORDER — LIDOCAINE HCL (PF) 2 % IJ SOLN
INTRAMUSCULAR | Status: AC
  Filled 2020-03-22: qty 5

## 2020-03-22 MED ORDER — DEXAMETHASONE SODIUM PHOSPHATE 10 MG/ML IJ SOLN
INTRAMUSCULAR | Status: DC | PRN
  Administered 2020-03-22: 10 mg via INTRAVENOUS

## 2020-03-22 MED ORDER — PHENYLEPHRINE HCL (PRESSORS) 10 MG/ML IV SOLN
INTRAVENOUS | Status: DC | PRN
  Administered 2020-03-22 (×2): 200 ug via INTRAVENOUS

## 2020-03-22 MED ORDER — BUPIVACAINE LIPOSOME 1.3 % IJ SUSP
INTRAMUSCULAR | Status: DC | PRN
  Administered 2020-03-22: 50 mL

## 2020-03-22 MED ORDER — CEFAZOLIN SODIUM-DEXTROSE 1-4 GM/50ML-% IV SOLN
1.0000 g | Freq: Four times a day (QID) | INTRAVENOUS | Status: AC
  Administered 2020-03-22 – 2020-03-23 (×3): 1 g via INTRAVENOUS
  Filled 2020-03-22 (×5): qty 50

## 2020-03-22 MED ORDER — BUPIVACAINE LIPOSOME 1.3 % IJ SUSP
INTRAMUSCULAR | Status: AC
  Filled 2020-03-22: qty 20

## 2020-03-22 MED ORDER — CHLORHEXIDINE GLUCONATE CLOTH 2 % EX PADS
6.0000 | MEDICATED_PAD | Freq: Every day | CUTANEOUS | Status: DC
  Administered 2020-03-22 – 2020-04-02 (×6): 6 via TOPICAL

## 2020-03-22 MED ORDER — SODIUM CHLORIDE 0.9 % IR SOLN
Status: DC | PRN
  Administered 2020-03-22: 500 mL

## 2020-03-22 MED ORDER — CEFAZOLIN SODIUM-DEXTROSE 1-4 GM/50ML-% IV SOLN
INTRAVENOUS | Status: AC
  Filled 2020-03-22: qty 50

## 2020-03-22 MED ORDER — OXYCODONE HCL 5 MG PO TABS
2.5000 mg | ORAL_TABLET | ORAL | Status: DC | PRN
  Administered 2020-03-24 – 2020-03-25 (×5): 5 mg via ORAL
  Filled 2020-03-22 (×4): qty 1

## 2020-03-22 MED ORDER — METHOCARBAMOL 1000 MG/10ML IJ SOLN
500.0000 mg | Freq: Four times a day (QID) | INTRAVENOUS | Status: DC | PRN
  Filled 2020-03-22: qty 5

## 2020-03-22 MED ORDER — TRAMADOL HCL 50 MG PO TABS
50.0000 mg | ORAL_TABLET | Freq: Four times a day (QID) | ORAL | Status: DC | PRN
  Administered 2020-03-23 – 2020-03-26 (×4): 50 mg via ORAL
  Filled 2020-03-22 (×4): qty 1

## 2020-03-22 MED ORDER — SODIUM CHLORIDE 0.9 % IV SOLN
INTRAVENOUS | Status: DC | PRN
  Administered 2020-03-22: 50 ug/min via INTRAVENOUS

## 2020-03-22 SURGICAL SUPPLY — 49 items
"PENCIL ELECTRO HAND CTR " (MISCELLANEOUS) ×1 IMPLANT
BIT DRILL 4.0X280 (BIT) ×2 IMPLANT
BLADE SURG 10 STRL SS SAFETY (BLADE) ×2 IMPLANT
BLADE SURG 15 STRL LF DISP TIS (BLADE) ×1 IMPLANT
BLADE SURG 15 STRL SS (BLADE) ×2
CANISTER SUCT 1200ML W/VALVE (MISCELLANEOUS) ×3 IMPLANT
CHLORAPREP W/TINT 26 (MISCELLANEOUS) ×3 IMPLANT
COVER WAND RF STERILE (DRAPES) ×3 IMPLANT
DRAPE 3/4 80X56 (DRAPES) ×3 IMPLANT
DRAPE SURG 17X11 SM STRL (DRAPES) ×6 IMPLANT
DRAPE U-SHAPE 47X51 STRL (DRAPES) ×6 IMPLANT
DRSG OPSITE POSTOP 3X4 (GAUZE/BANDAGES/DRESSINGS) ×15 IMPLANT
DRSG OPSITE POSTOP 4X6 (GAUZE/BANDAGES/DRESSINGS) ×2 IMPLANT
ELECT REM PT RETURN 9FT ADLT (ELECTROSURGICAL) ×3
ELECTRODE REM PT RTRN 9FT ADLT (ELECTROSURGICAL) ×1 IMPLANT
GLOVE BIOGEL PI IND STRL 8 (GLOVE) ×1 IMPLANT
GLOVE BIOGEL PI INDICATOR 8 (GLOVE) ×2
GLOVE SURG SYN 7.5  E (GLOVE) ×2
GLOVE SURG SYN 7.5 E (GLOVE) ×1 IMPLANT
GLOVE SURG SYN 7.5 PF PI (GLOVE) ×1 IMPLANT
GOWN STRL REUS W/ TWL LRG LVL3 (GOWN DISPOSABLE) ×1 IMPLANT
GOWN STRL REUS W/ TWL XL LVL3 (GOWN DISPOSABLE) ×1 IMPLANT
GOWN STRL REUS W/TWL LRG LVL3 (GOWN DISPOSABLE) ×2
GOWN STRL REUS W/TWL XL LVL3 (GOWN DISPOSABLE) ×2
GUIDE PIN 3.2X330 (PIN) ×6
GUIDEWIRE BALL NOSE 3.0X900 (WIRE) ×2
GUIDEWIRE ORTH 900X3XBALL NOSE (WIRE) IMPLANT
KIT PATIENT CARE HANA TABLE (KITS) ×3 IMPLANT
KIT TURNOVER KIT A (KITS) ×3 IMPLANT
MAT ABSORB  FLUID 56X50 GRAY (MISCELLANEOUS) ×2
MAT ABSORB FLUID 56X50 GRAY (MISCELLANEOUS) ×2 IMPLANT
NAIL TROCH ES 10X36X130 (Nail) ×2 IMPLANT
NDL FILTER BLUNT 18X1 1/2 (NEEDLE) ×1 IMPLANT
NEEDLE FILTER BLUNT 18X 1/2SAF (NEEDLE) ×2
NEEDLE FILTER BLUNT 18X1 1/2 (NEEDLE) ×1 IMPLANT
NEEDLE HYPO 22GX1.5 SAFETY (NEEDLE) ×3 IMPLANT
NS IRRIG 1000ML POUR BTL (IV SOLUTION) ×3 IMPLANT
PACK HIP COMPR (MISCELLANEOUS) ×3 IMPLANT
PENCIL ELECTRO HAND CTR (MISCELLANEOUS) ×3 IMPLANT
PIN GUIDE 3.2X330 (PIN) IMPLANT
SCREW CORTICAL 5.0X32 (Screw) ×2 IMPLANT
SCREW LAG GALILEO 10.5X90 (Screw) ×2 IMPLANT
STAPLER SKIN PROX 35W (STAPLE) ×3 IMPLANT
SUT VIC AB 0 CT1 36 (SUTURE) ×6 IMPLANT
SUT VIC AB 2-0 CT2 27 (SUTURE) ×5 IMPLANT
SYR 10ML LL (SYRINGE) ×3 IMPLANT
SYR 30ML LL (SYRINGE) ×3 IMPLANT
TAPE CLOTH 3X10 WHT NS LF (GAUZE/BANDAGES/DRESSINGS) ×6 IMPLANT
TOOL ACTIVATION (INSTRUMENTS) ×2 IMPLANT

## 2020-03-22 NOTE — Plan of Care (Signed)

## 2020-03-22 NOTE — Anesthesia Procedure Notes (Signed)
Procedure Name: Intubation Performed by: Gaynelle Cage, CRNA Pre-anesthesia Checklist: Patient identified, Emergency Drugs available, Suction available and Patient being monitored Patient Re-evaluated:Patient Re-evaluated prior to induction Oxygen Delivery Method: Circle system utilized Preoxygenation: Pre-oxygenation with 100% oxygen Induction Type: IV induction and Cricoid Pressure applied Ventilation: Mask ventilation without difficulty Grade View: Grade I Tube type: Oral Tube size: 7.0 mm Number of attempts: 1 Airway Equipment and Method: Oral airway Placement Confirmation: ETT inserted through vocal cords under direct vision,  positive ETCO2 and breath sounds checked- equal and bilateral Secured at: 22 cm Tube secured with: Tape Dental Injury: Teeth and Oropharynx as per pre-operative assessment
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
## 2020-03-22 NOTE — Progress Notes (Signed)
PROGRESS NOTE    Rebecca Cruz  YYT:[id number] DOB: 1924/08/04 DOA: 03/21/2020 PCP: Patient, No Pcp Per    Assessment & Plan:   Principal Problem:   Intertrochanteric fracture of right femur ([place]) Active Problems:   Hypertension   Thyroid disease   Atrial fibrillation with RVR ([place])   Intertrochanteric fracture of right femur, closed, initial encounter ([place])   Chronic kidney disease, stage 4 (severe) (HCC)   Protein-calorie malnutrition, severe    Rebecca Cruz is a 86 y.o. female with medical history significant for hypertension, hypothyroidism, stage IV chronic kidney disease and atrial fibrillation on chronic anticoagulation therapy who is visiting family in the area. She was brought in to the ER by EMS for evaluation of right hip pain.   Intertrochanteric fracture of right femur Patient is status post fall and has a displaced comminuted fracture through the right femoral intertrochanteric region without dislocation. A nondisplaced left inferior pubic ramus fracture is not excluded. PLAN: --INTRAMEDULLARY (IM) NAIL INTERTROCHANTRIC today --Pain control --PT/OT --reduce MIVF to 50  History of atrial fibrillation on home Eliquis Continue metoprolol for rate control --hold Eliquis for planned surgery   Hypertension Blood pressure stable Continue metoprolol and losartan   Hypothyroidism Continue Synthroid   Chronic kidney disease, stage 4 Stable   Leukocytosis Most likely secondary to leukemoid reaction No obvious signs of an infectious process at this time    DVT prophylaxis: SCD/Compression stockings Code Status: DNR  Family Communication:  Status is: inpatient Dispo:   The patient is from: home Anticipated d/c is to: undetermined Anticipated d/c date is: 2-3 days Patient currently is not medically stable to d/c due to: just post-op, need PT eval and surgical clearance    Subjective and Interval History:  Pt was seen after surgery.   Pt reported no pain, no dyspnea.  No fever, N/V/D.   Objective: Vitals:   03/22/20 1530 03/22/20 1536 03/22/20 1552 03/22/20 1734  BP:  118/79 (!) 119/59 (!) 106/59  Pulse: 74 79 74 75  Resp: 20 17  19   Temp:  98.2 F (36.8 C) 98.3 F (36.8 C) 97.9 F (36.6 C)  TempSrc:   Oral Oral  SpO2: 97% 97% 97% 90%  Weight:      Height:        Intake/Output Summary (Last 24 hours) at 03/22/2020 1939 Last data filed at 03/22/2020 1500 Gross per 24 hour  Intake 1230 ml  Output 1950 ml  Net -720 ml   Filed Weights   03/21/20 0808  Weight: 51.5 kg    Examination:   Constitutional: NAD, AAOx3 HEENT: conjunctivae and lids normal, EOMI CV: irregular. Distal pulses +2.  No cyanosis.   RESP: CTA B/L, normal respiratory effort  GI: +BS, NTND Extremities: No effusions, edema, or tenderness in BLE SKIN: warm, dry and intact Neuro: II - XII grossly intact.  Sensation intact Psych: Normal mood and affect.     Data Reviewed: I have personally reviewed following labs and imaging studies  CBC: Recent Labs  Lab 03/21/20 0806 03/22/20 0546  WBC 18.0* 22.3*  HGB 14.9 11.2*  HCT 44.2 34.2*  MCV 93.8 96.1  PLT 279 275   Basic Metabolic Panel: Recent Labs  Lab 03/21/20 0806 03/22/20 0546  NA 140 140  K 4.1 3.9  CL 106 106  CO2 24 23  GLUCOSE 143* 123*  BUN 33* 34*  CREATININE 0.98 0.93  CALCIUM 9.2 8.8*   GFR: Estimated Creatinine Clearance: 26.6 mL/min (by C-G formula based on SCr  of 0.93 mg/dL). Liver Function Tests: No results for input(s): AST, ALT, ALKPHOS, BILITOT, PROT, ALBUMIN in the last 168 hours. No results for input(s): LIPASE, AMYLASE in the last 168 hours. No results for input(s): AMMONIA in the last 168 hours. Coagulation Profile: No results for input(s): INR, PROTIME in the last 168 hours. Cardiac Enzymes: No results for input(s): CKTOTAL, CKMB, CKMBINDEX, TROPONINI in the last 168 hours. BNP (last 3 results) No results for input(s): PROBNP in the last  8760 hours. HbA1C: No results for input(s): HGBA1C in the last 72 hours. CBG: No results for input(s): GLUCAP in the last 168 hours. Lipid Profile: No results for input(s): CHOL, HDL, LDLCALC, TRIG, CHOLHDL, LDLDIRECT in the last 72 hours. Thyroid Function Tests: No results for input(s): TSH, T4TOTAL, FREET4, T3FREE, THYROIDAB in the last 72 hours. Anemia Panel: No results for input(s): VITAMINB12, FOLATE, FERRITIN, TIBC, IRON, RETICCTPCT in the last 72 hours. Sepsis Labs: No results for input(s): PROCALCITON, LATICACIDVEN in the last 168 hours.  Recent Results (from the past 240 hour(s))  SARS Coronavirus 2 by RT PCR (hospital order, performed in [facility] hospital lab) Nasopharyngeal Nasopharyngeal Swab     Status: None   Collection Time: 03/21/20  8:06 AM   Specimen: Nasopharyngeal Swab  Result Value Ref Range Status   SARS Coronavirus 2 NEGATIVE NEGATIVE Final    Comment: (NOTE) SARS-CoV-2 target nucleic acids are NOT DETECTED.  The SARS-CoV-2 RNA is generally detectable in upper and lower respiratory specimens during the acute phase of infection. The lowest concentration of SARS-CoV-2 viral copies this assay can detect is 250 copies / mL. A negative result does not preclude SARS-CoV-2 infection and should not be used as the sole basis for treatment or other patient management decisions.  A negative result may occur with improper specimen collection / handling, submission of specimen other than nasopharyngeal swab, presence of viral mutation(s) within the areas targeted by this assay, and inadequate number of viral copies (<250 copies / mL). A negative result must be combined with clinical observations, patient history, and epidemiological information.  Fact Sheet for Patients:   StrictlyIdeas.no  Fact Sheet for Healthcare Providers: BankingDealers.co.za  This test is not yet approved or  cleared by the [place] FDA  and has been authorized for detection and/or diagnosis of SARS-CoV-2 by FDA under an Emergency Use Authorization (EUA).  This EUA will remain in effect (meaning this test can be used) for the duration of the COVID-19 declaration under Section 564(b)(1) of the Act, 21 U.S.C. section 360bbb-3(b)(1), unless the authorization is terminated or revoked sooner.  Performed at [facility], [address], [place], [place] [address]   Surgical PCR screen     Status: None   Collection Time: 03/21/20 12:15 PM   Specimen: Nasal Mucosa; Nasal Swab  Result Value Ref Range Status   MRSA, PCR NEGATIVE NEGATIVE Final   Staphylococcus aureus NEGATIVE NEGATIVE Final    Comment: (NOTE) The Xpert SA Assay (FDA approved for NASAL specimens in patients 40 years of age and older), is one component of a comprehensive surveillance program. It is not intended to diagnose infection nor to guide or monitor treatment. Performed at [facility], [address], [place], [place] [address]       Radiology Studies: DG Chest 1 View  Result Date: 03/21/2020 CLINICAL DATA:  Preoperative study EXAM: CHEST  1 VIEW COMPARISON:  None. FINDINGS: Cardiomegaly. Diffuse interstitial opacities and mild Kerley B lines. No pneumothorax. No nodules or masses.  No focal infiltrates. IMPRESSION: Cardiomegaly and mild edema. Electronically Signed   By: Dorise Bullion III M.D   On: 03/21/2020 09:09   DG Pelvis 1-2 Views  Result Date: 03/21/2020 CLINICAL DATA:  Pain after fall EXAM: PELVIS - 1-2 VIEW COMPARISON:  None. FINDINGS: A comminuted displaced fracture through the right femoral intertrochanteric region is identified. A nondisplaced left inferior pubic ramus fracture is not excluded. No other bony abnormalities in the pelvic bones. Vascular calcifications. IMPRESSION: 1. Displaced comminuted fracture through the right femoral intertrochanteric region. A nondisplaced fracture through the right inferior pubic  ramus is not excluded. Electronically Signed   By: Dorise Bullion III M.D   On: 03/21/2020 09:12   CT Head Wo Contrast  Result Date: 03/21/2020 CLINICAL DATA:  Pt arrives [facility] from neices home. Pt visiting from [place]. Uses cane/walked. States she lost her balance and fell on floor inside house. Swelling noted to R upper thigh. Internal rotation to R leg. Shortening as well. A&. TKV EXAM: CT HEAD WITHOUT CONTRAST TECHNIQUE: Contiguous axial images were obtained from the base of the skull through the vertex without intravenous contrast. COMPARISON:  None. FINDINGS: Brain: No evidence of acute infarction, hemorrhage, hydrocephalus, extra-axial collection or mass lesion/mass effect. There is ventricular sulcal enlargement reflecting mild diffuse atrophy. Patchy bilateral white matter hypoattenuation is also noted consistent with chronic microvascular ischemic change. Vascular: No hyperdense vessel or unexpected calcification. Skull: Normal. Negative for fracture or focal lesion. Sinuses/Orbits: Visualized globes and orbits are unremarkable. The visualized sinuses are clear. Other: There is a small amount of soft tissue air below the skull base, projecting adjacent to the right pterygoid plates, posterior to the right maxillary sinus and in the infratemporal fossa. The etiology of this is unclear. IMPRESSION: 1. No acute intracranial abnormalities. 2. Mild atrophy and moderate chronic microvascular ischemic change. 3. Small amount of soft tissue air at the right skull base as detailed above. Consider a facial fracture, follow-up facial CT, if there is facial trauma. Electronically Signed   By: Lajean Manes M.D.   On: 03/21/2020 09:14   DG HIP OPERATIVE UNILAT W OR W/O PELVIS RIGHT  Result Date: 03/22/2020 CLINICAL DATA:  Right proximal femur fracture. EXAM: OPERATIVE right HIP (WITH PELVIS IF PERFORMED) 6 VIEWS TECHNIQUE: Fluoroscopic spot image(s) were submitted for interpretation post-operatively.  FLUOROSCOPY TIME:  2 minutes 5 seconds. COMPARISON:  March 21, 2020. FINDINGS: Six intraoperative fluoroscopic images were obtained of the right hip and femur. These images demonstrate surgical internal fixation of proximal right femoral fracture with intramedullary rod fixation of the right femoral shaft. Improved alignment of fracture components is noted. IMPRESSION: Status post surgical internal fixation of proximal right femoral fracture. Electronically Signed   By: Marijo Conception M.D.   On: 03/22/2020 13:58   DG Femur Min 2 Views Right  Result Date: 03/21/2020 CLINICAL DATA:  Preoperative study EXAM: RIGHT FEMUR 2 VIEWS COMPARISON:  None. FINDINGS: A displaced comminuted intertrochanteric fracture seen in the right hip. The remainder of the femur is intact. The proximal tibia and fibula are intact. No femoral head dislocation. A fracture through the left inferior pubic ramus is not excluded. No other acute abnormalities. IMPRESSION: 1. The patient has a displaced comminuted fracture through the right femoral intertrochanteric region without dislocation. 2. A nondisplaced left inferior pubic ramus fracture is not excluded. 3. No other abnormalities. Electronically Signed   By: Dorise Bullion III M.D   On: 03/21/2020 09:11     Scheduled Meds: . acetaminophen  1,000 mg Oral Q8H  . [START ON 03/23/2020] apixaban  2.5 mg Oral BID  . calcium acetate  667 mg Oral BID WC  . Chlorhexidine Gluconate Cloth  6 each Topical Daily  . docusate sodium  100 mg Oral BID  . levothyroxine  75 mcg Oral Q0600  . loratadine  10 mg Oral Daily  . losartan  100 mg Oral Daily  . metoprolol succinate  50 mg Oral Daily   Continuous Infusions: . sodium chloride 75 mL/hr at 03/22/20 1654  .  ceFAZolin (ANCEF) IV 1 g (03/22/20 1856)  . methocarbamol (ROBAXIN) IV       LOS: 1 day     Enzo Bi, MD [facility] If 7PM-7AM, please contact night-coverage 03/22/2020, 7:39 PM

## 2020-03-22 NOTE — Anesthesia Preprocedure Evaluation (Signed)
Anesthesia Evaluation  Patient identified by MRN, date of birth, ID band Patient awake    Reviewed: Allergy & Precautions, NPO status , Patient's Chart, lab work & pertinent test results  Airway Mallampati: III       Dental   Pulmonary neg pulmonary ROS,           Cardiovascular hypertension, + dysrhythmias Atrial Fibrillation      Neuro/Psych negative neurological ROS     GI/Hepatic negative GI ROS, Neg liver ROS,   Endo/Other  Hypothyroidism   Renal/GU Renal InsufficiencyRenal disease  negative genitourinary   Musculoskeletal   Abdominal   Peds negative pediatric ROS (+)  Hematology negative hematology ROS (+)   Anesthesia Other Findings Past Medical History: No date: Hypertension No date: Thyroid disease  Reproductive/Obstetrics                             Anesthesia Physical Anesthesia Plan  ASA: III  Anesthesia Plan: General   Post-op Pain Management:    Induction: Intravenous  PONV Risk Score and Plan:   Airway Management Planned: Oral ETT  Additional Equipment:   Intra-op Plan:   Post-operative Plan: Extubation in OR  Informed Consent: I have reviewed the patients History and Physical, chart, labs and discussed the procedure including the risks, benefits and alternatives for the proposed anesthesia with the patient or authorized representative who has indicated his/her understanding and acceptance.     Dental advisory given  Plan Discussed with: CRNA and Surgeon  Anesthesia Plan Comments:         Anesthesia Quick Evaluation

## 2020-03-22 NOTE — Progress Notes (Signed)
Transportation company of the pt talked to RN requiring MAR, Facesheet and proof of Negative Covid test to Fax No. [phone], Direct No [phone]. Will inform [facility] Case Manager. Transportation company is [employer].

## 2020-03-22 NOTE — H&P (Signed)
H&P reviewed. No significant changes noted.  

## 2020-03-22 NOTE — Op Note (Signed)
DATE OF SURGERY: 03/22/2020  PREOPERATIVE DIAGNOSIS: Right intertrochanteric hip fracture  POSTOPERATIVE DIAGNOSIS: Right intertrochanteric hip fracture  PROCEDURE: Intramedullary nailing of R femur with cephalomedullary device  SURGEON: Cato Mulligan, MD  ANESTHESIA: spinal  EBL: 300 cc  IVF: per anesthesia record  COMPONENTS:  AOS Galileo ES Long Nail: 10x389mm; 28mm lag screw; 82mm distal cortical interlocking screw  INDICATIONS: Rebecca Cruz is a 86 y.o. female who sustained an intertrochanteric fracture after a fall. Risks and benefits of intramedullary nailing were explained to the patient and family (who were PoA). Risks include but are not limited to bleeding (especially given baseline anticoagulation with Eliquis), infection, injury to tissues, nerves, vessels, nonunion/malunion, hardware failure, limb length discrepancy/hip rotation mismatch and risks of anesthesia. The patient and family understand these risks, have completed an informed consent, and wish to proceed.   PROCEDURE:  The patient was brought into the operating room. After administering anesthesia, the patient was placed in the supine position on the Hana table. The uninjured leg was placed in an extended position while the injured lower extremity was placed in longitudinal traction.  The fracture was not able to be appropriately reduced and this fashion.  The lateral aspect of the hip and thigh were prepped with ChloraPrep solution before being draped sterilely. Preoperative IV antibiotics were administered. A timeout was performed to verify the appropriate surgical site, patient, and procedure.    A stab incision was made over the anterior aspect of the proximal femur.  Using fluoroscopic guidance a joker retractor was used in an attempt to disimpact the anterior cortex of the femur and lever it out into a more reduced position.  Additionally, a ball-spike pusher was used to remove the flexion from the proximal  fragment.  Using this combination, the fracture could be appropriately reduced, but not appropriately held.  Therefore, an approximately 10 cm incision was made over the lateral aspect of the femur.  IT band was incised and the vastus muscle was split.  A lobster claw reduction clamp was used to appropriately reduce the fracture.  This was confirmed on both the AP and lateral views.   Next, the greater trochanter was identified and an approximately 6 cm incision was made about 3 fingerbreadths above the tip of the greater trochanter. The incision was carried down through the subcutaneous tissues to expose the gluteal fascia. This was split the length of the incision, providing access to the tip of the trochanter. Under fluoroscopic guidance, a guidewire was drilled through the tip of the trochanter into the proximal metaphysis to the level of the lesser trochanter. After verifying its position fluoroscopically in AP and lateral projections, it was overreamed with the opening reamer to the level of the lesser trochanter. A guidewire was passed down through the femoral canal to the supracondylar region. The adequacy of guidewire position was verified fluoroscopically in AP and lateral projections before the length of the guidewire within the canal was measured and a nail of appropriate length was selected. The guidewire was overreamed sequentially using the flexible reamers. The appropriate sized nail was selected and advanced to the appropriate depth as verified fluoroscopically.    The guide system for the lag screw was positioned and advanced through prior incision along the lateral aspect of the femur.. The guidewire was drilled up through the femoral nail and into the femoral neck to rest within 5 mm of subchondral bone. After verifying its position in the femoral neck and head in both AP and lateral projections,  the guidewire was measured and appropriate sized lag screw was selected. An antirotation pin was  placed proximally through the appropriate mechanism. The guidewire was then overreamed to the appropriate depth before the lag screw was inserted and advanced to the appropriate depth as verified fluoroscopically in AP and lateral projections. The lag screw was advanced and the locking mechanism was deployed.  Antirotation pin was removed.  Again, the adequacy of hardware position and fracture reduction was verified fluoroscopically in AP and lateral projections.   Attention was then turned to the distal interlocking screw in the diaphysis. Using a targeted assembly, a stab incision was made and hole was drilled through the nail. An interlocking screw was placed with excellent purchase. Again the adequacy of screw position was verified fluoroscopically in AP and lateral projections.   The wounds were irrigated thoroughly with sterile saline solution. Local anesthetic was injected into the wounds.  Gluteal fascia and IT band was closed with 0-Vicryl.  0 Vicryl was used to place deep fat stitches to close the dead space.  The subcutaneous tissues were closed using 2-0 Vicryl interrupted sutures. The skin was closed using staples. Sterile occlusive dressings were applied to all wounds. The patient was then transferred to the recovery room in satisfactory condition.   Of note, this case had significantly additional complexity compared to the standard cephalomedullary nail of the femur.  This fracture did not appropriately reduce using longitudinal traction and rotation as most of these types of fractures do.  Therefore, I attempted an indirect reduction various methods as indicated above, but the reduction could not be held using these maneuvers.  This necessitated a much larger lateral incision over the femur causing increased blood loss as well.  An open reduction had to be performed with a reduction clamp.  These factors added approximately 1 hour to the surgical time.   POSTOPERATIVE PLAN: The patient will  be WBAT on the operative extremity. Lovenox 40mg /day x 4 weeks to start on POD#1. Perioperative IV antibiotics x 24 hours. PT/OT on POD#1.

## 2020-03-22 NOTE — TOC Progression Note (Signed)
Transition of Care [facility]) - Progression Note    Patient Details  Name: Rebecca Cruz MRN: [id number] Date of Birth: [birth year]  Transition of Care [facility]) CM/SW Lav[place], RN Phone Number: 03/22/2020, 4:40 PM  Clinical Narrative:      Met with the patient's son and daughter in law The patient lives in [place] and is here visiting family, The son is looking into medical transport to take the patient to a [facility], they are also considering going to rehab here in [place], they are looking into some options and will talk with me about it tomorrow to begin the bed search      Expected Discharge Plan and Services                                                 Social Determinants of Health (SDOH) Interventions    Readmission Risk Interventions No flowsheet data found.

## 2020-03-22 NOTE — Transfer of Care (Signed)
Immediate Anesthesia Transfer of Care Note  Patient: Rebecca Cruz  Procedure(s) Performed: INTRAMEDULLARY (IM) NAIL INTERTROCHANTRIC (Right Hip)  Patient Location: PACU  Anesthesia Type:General  Level of Consciousness: sedated  Airway & Oxygen Therapy: Patient Spontanous Breathing and Patient connected to face mask oxygen  Post-op Assessment: Report given to RN and Post -op Vital signs reviewed and stable  Post vital signs: Reviewed and stable  Last Vitals:  Vitals Value Taken Time  BP 116/62 03/22/20 1432  Temp    Pulse    Resp 15 03/22/20 1437  SpO2    Vitals shown include unvalidated device data.  Last Pain:  Vitals:   03/22/20 1431  TempSrc:   PainSc: (P) 0-No pain         Complications: No complications documented.

## 2020-03-23 ENCOUNTER — Encounter: Payer: Self-pay | Admitting: Orthopedic Surgery

## 2020-03-23 LAB — BASIC METABOLIC PANEL
Anion gap: 7 (ref 5–15)
BUN: 51 mg/dL — ABNORMAL HIGH (ref 8–23)
CO2: 25 mmol/L (ref 22–32)
Calcium: 8 mg/dL — ABNORMAL LOW (ref 8.9–10.3)
Chloride: 105 mmol/L (ref 98–111)
Creatinine, Ser: 1.42 mg/dL — ABNORMAL HIGH (ref 0.44–1.00)
GFR calc Af Amer: 37 mL/min — ABNORMAL LOW (ref >60)
GFR calc non Af Amer: 32 mL/min — ABNORMAL LOW (ref >60)
Glucose, Bld: 174 mg/dL — ABNORMAL HIGH (ref 70–99)
Potassium: 4.6 mmol/L (ref 3.5–5.1)
Sodium: 137 mmol/L (ref 135–145)

## 2020-03-23 LAB — CBC
HCT: 27.9 % — ABNORMAL LOW (ref 36.0–46.0)
Hemoglobin: 8.9 g/dL — ABNORMAL LOW (ref 12.0–15.0)
MCH: 31.4 pg (ref 26.0–34.0)
MCHC: 31.9 g/dL (ref 30.0–36.0)
MCV: 98.6 fL (ref 80.0–100.0)
Platelets: 295 10*3/uL (ref 150–400)
RBC: 2.83 MIL/uL — ABNORMAL LOW (ref 3.87–5.11)
RDW: 15.9 % — ABNORMAL HIGH (ref 11.5–15.5)
WBC: 26.7 10*3/uL — ABNORMAL HIGH (ref 4.0–10.5)
nRBC: 0.1 % (ref 0.0–0.2)

## 2020-03-23 LAB — MAGNESIUM: Magnesium: 1.8 mg/dL (ref 1.7–2.4)

## 2020-03-23 LAB — PROCALCITONIN: Procalcitonin: 0.63 ng/mL

## 2020-03-23 MED ORDER — TRAMADOL HCL 50 MG PO TABS: 50.0000 mg | ORAL_TABLET | Freq: Four times a day (QID) | ORAL | 0 refills | Status: AC | PRN

## 2020-03-23 MED ORDER — OXYCODONE HCL 5 MG PO TABS: 2.5000 mg | ORAL_TABLET | ORAL | 0 refills | Status: AC | PRN

## 2020-03-23 MED ORDER — ENSURE ENLIVE PO LIQD
237.0000 mL | Freq: Two times a day (BID) | ORAL | Status: DC
  Administered 2020-03-23 – 2020-03-30 (×12): 237 mL via ORAL

## 2020-03-23 NOTE — Progress Notes (Signed)
  Subjective: 1 Day Post-Op Procedure(s) (LRB): INTRAMEDULLARY (IM) NAIL INTERTROCHANTRIC (Right) Patient reports pain as mild.   Patient is well, and has had no acute complaints or problems Plan is to go Rehab after hospital stay. Negative for chest pain and shortness of breath Fever: no Gastrointestinal: Negative for nausea and vomiting  Objective: Vital signs in last 24 hours: Temp:  [96.9 F (36.1 C)-98.6 F (37 C)] 97.9 F (36.6 C) (06/29 0406) Pulse Rate:  [69-93] 71 (06/29 0406) Resp:  [14-20] 16 (06/29 0406) BP: (99-119)/(46-79) 99/46 (06/29 0406) SpO2:  [90 %-98 %] 94 % (06/29 0406)  Intake/Output from previous day:  Intake/Output Summary (Last 24 hours) at 03/23/2020 0711 Last data filed at 03/22/2020 2242 Gross per 24 hour  Intake 1300 ml  Output 1650 ml  Net -350 ml    Intake/Output this shift: No intake/output data recorded.  Labs: Recent Labs    03/21/20 0806 03/22/20 0546 03/23/20 0345  HGB 14.9 11.2* 8.9*   Recent Labs    03/22/20 0546 03/23/20 0345  WBC 22.3* 26.7*  RBC 3.56* 2.83*  HCT 34.2* 27.9*  PLT 293 295   Recent Labs    03/22/20 0546 03/23/20 0345  NA 140 137  K 3.9 4.6  CL 106 105  CO2 23 25  BUN 34* 51*  CREATININE 0.93 1.42*  GLUCOSE 123* 174*  CALCIUM 8.8* 8.0*   No results for input(s): LABPT, INR in the last 72 hours.   EXAM General - Patient is Alert and Oriented Extremity - Neurovascular intact Sensation intact distally Dorsiflexion/Plantar flexion intact Compartment soft Dressing/Incision -the 2 distal incisions are somewhat saturated.  The honeycomb dressing was replaced with a new clean dressing. Motor Function - intact, moving foot and toes well on exam.   Past Medical History:  Diagnosis Date  . Hypertension   . Thyroid disease     Assessment/Plan: 1 Day Post-Op Procedure(s) (LRB): INTRAMEDULLARY (IM) NAIL INTERTROCHANTRIC (Right) Principal Problem:   Intertrochanteric fracture of right femur  (HCC) Active Problems:   Hypertension   Thyroid disease   Atrial fibrillation with RVR (H[facility])   Intertrochanteric fracture of right femur, closed, initial encounter ([place])   Chronic kidney disease, stage 4 (severe) (HCC)   Protein-calorie malnutrition, severe  Estimated body mass index is 22.19 kg/m as calculated from the following:   Height as of this encounter: 5' (1.524 m).   Weight as of this encounter: 51.5 kg. Advance diet Up with therapy D/C IV fluids  Follow-up at [facility] clinic orthopedics in 2 weeks for staple removal.  DVT Prophylaxis - Foot Pumps, TED hose and Eliquis Weight-Bearing as tolerated to right leg  Reche [name], PA-C Orthopaedic Surgery 03/23/2020, 7:11 AM

## 2020-03-23 NOTE — Progress Notes (Signed)
PROGRESS NOTE    Rebecca Cruz  MOQ:[id number] DOB: 22-Oct-1923 DOA: 03/21/2020 PCP: Patient, No Pcp Per    Assessment & Plan:   Principal Problem:   Intertrochanteric fracture of right femur ([place]) Active Problems:   Hypertension   Thyroid disease   Atrial fibrillation with RVR ([place])   Intertrochanteric fracture of right femur, closed, initial encounter ([place])   Chronic kidney disease, stage 4 (severe) (HCC)   Protein-calorie malnutrition, severe    Rebecca Cruz is a 86 y.o. Caucasian female with medical history significant for hypertension, hypothyroidism, stage IV chronic kidney disease and atrial fibrillation on chronic anticoagulation therapy who is visiting family in the area. She was brought in to the ER by EMS for evaluation of right hip pain.   Intertrochanteric fracture of right femur S/p INTRAMEDULLARY (IM) NAIL INTERTROCHANTRIC on 6/28 Patient is status post fall and has a displaced comminuted fracture through the right femoral intertrochanteric region without dislocation. A nondisplaced left inferior pubic ramus fracture is not excluded. PLAN: --Pain control --Weight-Bearing as tolerated to right leg --PT/OT --Follow-up at [facility] clinic orthopedics in 2 weeks for staple removal.  AKI, not POA --Cr went up to 1.42 from 0.93 after surgery.  Nursing noted reduced urine output even while on MIVF.  Likely pt is volume down. PLAN: --continue MIVF@100   --asked nurse to monitor for volume overload and respiratory issues  History of atrial fibrillation on home Eliquis Continue metoprolol for rate control --continue home Eliquis  Hypertension Blood pressure stable Continue metoprolol and losartan  Hypothyroidism Continue Synthroid  Chronic kidney disease, stage 4 Stable  Leukocytosis WBC 18 on presentation, up to 26.7 today. Most likely secondary to stress reaction No obvious signs of an infectious process at this time.  Son reported pt did have  hx of elevated WBC in the past without having infection. --Hold abx   DVT prophylaxis: PT:WSFKCLE Code Status: DNR  Family Communication: son updated on the phone today Status is: inpatient Dispo:   The patient is from: home Anticipated d/c is to: SNF rehab Anticipated d/c date is: 03/27/20 (need long-distance transport to [place], and this is the soonest date).  Patient currently is not medically stable to d/c due to: Need to make sure AKI improves, and monitor for infection for a few days due to leukocytosis.   Subjective and Interval History:  Pt reported pain only with moving.  Otherwise, reported doing well.  No fever, dyspnea, chest pain, abdominal pain, N/V/D, dysuria.  Eating full meals already.    Objective: Vitals:   03/23/20 0750 03/23/20 1142 03/23/20 1550 03/23/20 1946  BP: 111/61 (!) 103/49 131/61 126/60  Pulse: 60 68 91 87  Resp: 18 14 16 18   Temp: 98 F (36.7 C) 97.9 F (36.6 C) 98.5 F (36.9 C) 98 F (36.7 C)  TempSrc: Oral Oral Oral   SpO2: 96% 97% 97% 97%  Weight:      Height:        Intake/Output Summary (Last 24 hours) at 03/23/2020 2114 Last data filed at 03/23/2020 1928 Gross per 24 hour  Intake 1642.3 ml  Output --  Net 1642.3 ml   Filed Weights   03/21/20 0808  Weight: 51.5 kg    Examination:   Constitutional: NAD, AAOx3 HEENT: conjunctivae and lids normal, EOMI CV: irregular. Distal pulses +2.  No cyanosis.   RESP: CTA B/L, normal respiratory effort  GI: +BS, NTND Extremities: No effusions, edema, or tenderness in bilateral lower legs SKIN: warm, dry and intact Neuro: II -  XII grossly intact.  Sensation intact Psych: Normal mood and affect.     Data Reviewed: I have personally reviewed following labs and imaging studies  CBC: Recent Labs  Lab 03/21/20 0806 03/22/20 0546 03/23/20 0345  WBC 18.0* 22.3* 26.7*  HGB 14.9 11.2* 8.9*  HCT 44.2 34.2* 27.9*  MCV 93.8 96.1 98.6  PLT 279 293 563   Basic Metabolic Panel: Recent  Labs  Lab 03/21/20 0806 03/22/20 0546 03/23/20 0345  NA 140 140 137  K 4.1 3.9 4.6  CL 106 106 105  CO2 24 23 25   GLUCOSE 143* 123* 174*  BUN 33* 34* 51*  CREATININE 0.98 0.93 1.42*  CALCIUM 9.2 8.8* 8.0*  MG  --   --  1.8   GFR: Estimated Creatinine Clearance: 17.4 mL/min (A) (by C-G formula based on SCr of 1.42 mg/dL (H)). Liver Function Tests: No results for input(s): AST, ALT, ALKPHOS, BILITOT, PROT, ALBUMIN in the last 168 hours. No results for input(s): LIPASE, AMYLASE in the last 168 hours. No results for input(s): AMMONIA in the last 168 hours. Coagulation Profile: No results for input(s): INR, PROTIME in the last 168 hours. Cardiac Enzymes: No results for input(s): CKTOTAL, CKMB, CKMBINDEX, TROPONINI in the last 168 hours. BNP (last 3 results) No results for input(s): PROBNP in the last 8760 hours. HbA1C: No results for input(s): HGBA1C in the last 72 hours. CBG: No results for input(s): GLUCAP in the last 168 hours. Lipid Profile: No results for input(s): CHOL, HDL, LDLCALC, TRIG, CHOLHDL, LDLDIRECT in the last 72 hours. Thyroid Function Tests: No results for input(s): TSH, T4TOTAL, FREET4, T3FREE, THYROIDAB in the last 72 hours. Anemia Panel: No results for input(s): VITAMINB12, FOLATE, FERRITIN, TIBC, IRON, RETICCTPCT in the last 72 hours. Sepsis Labs: Recent Labs  Lab 03/23/20 0345  PROCALCITON 0.63    Recent Results (from the past 240 hour(s))  SARS Coronavirus 2 by RT PCR (hospital order, performed in [facility] hospital lab) Nasopharyngeal Nasopharyngeal Swab     Status: None   Collection Time: 03/21/20  8:06 AM   Specimen: Nasopharyngeal Swab  Result Value Ref Range Status   SARS Coronavirus 2 NEGATIVE NEGATIVE Final    Comment: (NOTE) SARS-CoV-2 target nucleic acids are NOT DETECTED.  The SARS-CoV-2 RNA is generally detectable in upper and lower respiratory specimens during the acute phase of infection. The lowest concentration of SARS-CoV-2  viral copies this assay can detect is 250 copies / mL. A negative result does not preclude SARS-CoV-2 infection and should not be used as the sole basis for treatment or other patient management decisions.  A negative result may occur with improper specimen collection / handling, submission of specimen other than nasopharyngeal swab, presence of viral mutation(s) within the areas targeted by this assay, and inadequate number of viral copies (<250 copies / mL). A negative result must be combined with clinical observations, patient history, and epidemiological information.  Fact Sheet for Patients:   StrictlyIdeas.no  Fact Sheet for Healthcare Providers: BankingDealers.co.za  This test is not yet approved or  cleared by the [place] FDA and has been authorized for detection and/or diagnosis of SARS-CoV-2 by FDA under an Emergency Use Authorization (EUA).  This EUA will remain in effect (meaning this test can be used) for the duration of the COVID-19 declaration under Section 564(b)(1) of the Act, 21 U.S.C. section 360bbb-3(b)(1), unless the authorization is terminated or revoked sooner.  Performed at [facility], [address], [place], [place] [address]  Surgical PCR screen     Status: None   Collection Time: 03/21/20 12:15 PM   Specimen: Nasal Mucosa; Nasal Swab  Result Value Ref Range Status   MRSA, PCR NEGATIVE NEGATIVE Final   Staphylococcus aureus NEGATIVE NEGATIVE Final    Comment: (NOTE) The Xpert SA Assay (FDA approved for NASAL specimens in patients 12 years of age and older), is one component of a comprehensive surveillance program. It is not intended to diagnose infection nor to guide or monitor treatment. Performed at [facility], [address], [place], [place] [address]       Radiology Studies: DG HIP OPERATIVE [facility] OR W/O PELVIS RIGHT  Result Date: 03/22/2020 CLINICAL DATA:   Right proximal femur fracture. EXAM: OPERATIVE right HIP (WITH PELVIS IF PERFORMED) 6 VIEWS TECHNIQUE: Fluoroscopic spot image(s) were submitted for interpretation post-operatively. FLUOROSCOPY TIME:  2 minutes 5 seconds. COMPARISON:  March 21, 2020. FINDINGS: Six intraoperative fluoroscopic images were obtained of the right hip and femur. These images demonstrate surgical internal fixation of proximal right femoral fracture with intramedullary rod fixation of the right femoral shaft. Improved alignment of fracture components is noted. IMPRESSION: Status post surgical internal fixation of proximal right femoral fracture. Electronically Signed   By: Marijo Conception M.D.   On: 03/22/2020 13:58     Scheduled Meds: . apixaban  2.5 mg Oral BID  . calcium acetate  667 mg Oral BID WC  . Chlorhexidine Gluconate Cloth  6 each Topical Daily  . docusate sodium  100 mg Oral BID  . feeding supplement (ENSURE ENLIVE)  237 mL Oral BID BM  . levothyroxine  75 mcg Oral Q0600  . loratadine  10 mg Oral Daily  . metoprolol succinate  50 mg Oral Daily   Continuous Infusions: . sodium chloride 100 mL/hr at 03/23/20 1704  . methocarbamol (ROBAXIN) IV       LOS: 2 days     Enzo Bi, MD [facility] If 7PM-7AM, please contact night-coverage 03/23/2020, 9:14 PM

## 2020-03-23 NOTE — Discharge Instructions (Signed)
INSTRUCTIONS AFTER Surgery  o Remove items at home which could result in a fall. This includes throw rugs or furniture in walking pathways o ICE to the affected joint every three hours while awake for 30 minutes at a time, for at least the first 3-5 days, and then as needed for pain and swelling.  Continue to use ice for pain and swelling. You may notice swelling that will progress down to the foot and ankle.  This is normal after surgery.  Elevate your leg when you are not up walking on it.   o Continue to use the breathing machine you got in the hospital (incentive spirometer) which will help keep your temperature down.  It is common for your temperature to cycle up and down following surgery, especially at night when you are not up moving around and exerting yourself.  The breathing machine keeps your lungs expanded and your temperature down.   DIET:  As you were doing prior to hospitalization, we recommend a well-balanced diet.  DRESSING / WOUND CARE / SHOWERING  Dressing change as needed.  No showering.  Staples will be removed in 2 weeks at [facility] clinic orthopedics.  ACTIVITY  o Increase activity slowly as tolerated, but follow the weight bearing instructions below.   o No driving for 6 weeks or until further direction given by your physician.  You cannot drive while taking narcotics.  o No lifting or carrying greater than 10 lbs. until further directed by your surgeon. o Avoid periods of inactivity such as sitting longer than an hour when not asleep. This helps prevent blood clots.  o You may return to work once you are authorized by your doctor.     WEIGHT BEARING  Weightbearing as tolerated with a walker   EXERCISES Gait training and ambulation training with a walker.  ADLs for occupational therapy.  Strengthening.  CONSTIPATION  Constipation is defined medically as fewer than three stools per week and severe constipation as less than one stool per week.  Even if you have  a regular bowel pattern at home, your normal regimen is likely to be disrupted due to multiple reasons following surgery.  Combination of anesthesia, postoperative narcotics, change in appetite and fluid intake all can affect your bowels.   YOU MUST use at least one of the following options; they are listed in order of increasing strength to get the job done.  They are all available over the counter, and you may need to use some, POSSIBLY even all of these options:    Drink plenty of fluids (prune juice may be helpful) and high fiber foods Colace 100 mg by mouth twice a day  Senokot for constipation as directed and as needed Dulcolax (bisacodyl), take with full glass of water  Miralax (polyethylene glycol) once or twice a day as needed.  If you have tried all these things and are unable to have a bowel movement in the first 3-4 days after surgery call either your surgeon or your primary doctor.    If you experience loose stools or diarrhea, hold the medications until you stool forms back up.  If your symptoms do not get better within 1 week or if they get worse, check with your doctor.  If you experience "the worst abdominal pain ever" or develop nausea or vomiting, please contact the office immediately for further recommendations for treatment.   ITCHING:  If you experience itching with your medications, try taking only a single pain pill, or even  half a pain pill at a time.  You can also use Benadryl over the counter for itching or also to help with sleep.   TED HOSE STOCKINGS:  Use stockings on both legs until for at least 2 weeks or as directed by physician office. They may be removed at night for sleeping.  MEDICATIONS:  See your medication summary on the "After Visit Summary" that nursing will review with you.  You may have some home medications which will be placed on hold until you complete the course of blood thinner medication.  It is important for you to complete the blood thinner  medication as prescribed.  PRECAUTIONS:  If you experience chest pain or shortness of breath - call 911 immediately for transfer to the hospital emergency department.   If you develop a fever greater that 101 F, purulent drainage from wound, increased redness or drainage from wound, foul odor from the wound/dressing, or calf pain - CONTACT YOUR SURGEON.                                                   FOLLOW-UP APPOINTMENTS:  If you do not already have a post-op appointment, please call the office for an appointment to be seen by your surgeon.  Guidelines for how soon to be seen are listed in your "After Visit Summary", but are typically between 1-4 weeks after surgery.  OTHER INSTRUCTIONS:   Follow-up at [facility] clinic orthopedics in 2 weeks for x-rays of the right hip.  Staples will be removed.  MAKE SURE YOU:  . Understand these instructions.  . Get help right away if you are not doing well or get worse.    Thank you for letting us be a part of your medical care team.  It is a privilege we respect greatly.  We hope these instructions will help you stay on track for a fast and full recovery!

## 2020-03-23 NOTE — TOC Progression Note (Addendum)
Transition of Care [facility]) - Progression Note    Patient Details  Name: Rebecca Cruz MRN: [id number] Date of Birth: Aug 20, 1924  Transition of Care [facility]) CM/SW Contact  Su Hilt, RN Phone Number: 03/23/2020, 8:38 AM  Clinical Narrative:     Transportation company called requiring MAR, Facesheet and proof of Negative Covid test to send to Fax No. [phone], Direct No [phone] to expedite transportation. Transportation company is [place]. .       Expected Discharge Plan and [place] Determinants of Health (SDOH) Interventions    Readmission Risk Interventions No flowsheet data found.

## 2020-03-23 NOTE — Evaluation (Signed)
Physical Therapy Evaluation Patient Details Name: Rebecca Cruz MRN: [id number] DOB: Mar 31, 1924 Today's Date: 03/23/2020   History of Present Illness  Pt is a 86 y.o. female presenting to hospital 6/27 s/p fall (tripped and fell last night) sustaining R hip pain.  Imaging showing: "Displaced comminuted fracture through the right femoral intertrochanteric region. A nondisplaced fracture through the right inferior pubic ramus is not excluded."  Per MD Posey Pronto note (ortho): "We will additionally plan to treat possible right inferior pubic ramus fracture non-operatively as there are no other signs of significant pelvic ring injury."  Pt s/p IM nailing of R femur with cephalomedullary device 6/28 secondary R intertrochanteric hip fx.  PMH includes htn, thyroid disease, aortic valve repair, appendectomy, abdominal hysterectomy, stage IV CKD, a-fib.  Clinical Impression  Prior to hospital admission, pt was modified independent with ambulation; lives in [place] with family but currently in [place] visiting family.  Currently pt is mod assist semi-supine to sitting edge of bed; mod assist with transfers; and mod assist to ambulate a couple feet bed to recliner with youth sized RW.  No R hip/thigh pain at rest beginning/end of session but increased with activity.  Pt would benefit from skilled PT to address noted impairments and functional limitations (see below for any additional details).  Upon hospital discharge, pt would benefit from STR.    Follow Up Recommendations SNF    Equipment Recommendations  Rolling walker with 5" wheels;3in1 (PT) (youth sized)    Recommendations for Other Services OT consult     Precautions / Restrictions Precautions Precautions: Fall Restrictions Weight Bearing Restrictions: Yes Other Position/Activity Restrictions: WBAT LE      Mobility  Bed Mobility Overal bed mobility: Needs Assistance Bed Mobility: Supine to Sit     Supine to sit: Mod assist;HOB elevated      General bed mobility comments: assist for R LE and trunk; vc's for technique; use of bed rail  Transfers Overall transfer level: Needs assistance Equipment used: Rolling walker (2 wheeled) (youth sized) Transfers: Sit to/from Stand Sit to Stand: Mod assist         General transfer comment: assist to initiate and come to full stand and also control descent sitting; vc's for UE/LE placement  Ambulation/Gait Ambulation/Gait assistance: Mod assist Gait Distance (Feet): 2 Feet Assistive device: Rolling walker (2 wheeled) (youth sized)   Gait velocity: decreased   General Gait Details: max vc's for increasing UE support through walker to offweight R LE (in order to advance L LE); pt alternating between pivoting on L LE and taking a small step with L LE; able to advance R LE fairly well (but decreased WB'ing noted through R LE d/t pain making L LE advancement more difficult); increased time and cueing to perform  Stairs            Wheelchair Mobility    Modified Rankin (Stroke Patients Only)       Balance Overall balance assessment: Needs assistance Sitting-balance support: No upper extremity supported;Feet supported Sitting balance-Leahy Scale: Fair Sitting balance - Comments: steady sitting with UE support on bed (requires assist for sitting balance without UE support)   Standing balance support: Bilateral upper extremity supported Standing balance-Leahy Scale: Poor Standing balance comment: pt requiring B UE support on walker for static standing balance                             Pertinent Vitals/Pain Pain Assessment: Faces Faces Pain Scale:  No hurt (no pain at rest; 6/10 with activity) Pain Location: R hip/thigh Pain Descriptors / Indicators: Discomfort;Grimacing Pain Intervention(s): Limited activity within patient's tolerance;Monitored during session;Repositioned;Premedicated before session;Ice applied  HR 60-75 bpm and O2 sats 96% or greater on room  air during session.    Home Living Family/patient expects to be discharged to:: Private residence                 Additional Comments: From [place] (lives with son).  Currently staying with niece (just got there and was planning on staying for a few weeks).  Pt reports staying on main floor but did not remember home set-up.    Prior Function Level of Independence: Independent with assistive device(s)               Hand Dominance        Extremity/Trunk Assessment   Upper Extremity Assessment Upper Extremity Assessment: Overall WFL for tasks assessed    Lower Extremity Assessment Lower Extremity Assessment: RLE deficits/detail (L LE WFL) RLE Deficits / Details: good quad set; at least 3/5 AROM DF/PF; hip flexion at least 3-/5 (limited d/t R hip pain) RLE: Unable to fully assess due to pain    Cervical / Trunk Assessment Cervical / Trunk Assessment: Normal  Communication   Communication: HOH  Cognition Arousal/Alertness: Awake/alert Behavior During Therapy: WFL for tasks assessed/performed Overall Cognitive Status: Within Functional Limits for tasks assessed                                        General Comments General comments (skin integrity, edema, etc.): mild drainage noted to R hip bandages.  Nursing cleared pt for participation in physical therapy.  Pt agreeable to PT session.    Exercises Total Joint Exercises Ankle Circles/Pumps: AROM;Strengthening;Both;10 reps;Supine Quad Sets: AROM;Strengthening;Both;10 reps;Supine Short Arc Quad: AAROM;Strengthening;Right;10 reps;Supine Heel Slides: AAROM;Strengthening;Right;10 reps;Supine Hip ABduction/ADduction: AAROM;Strengthening;Right;10 reps;Supine   Assessment/Plan    PT Assessment Patient needs continued PT services  PT Problem List Decreased strength;Decreased activity tolerance;Decreased balance;Decreased mobility;Decreased knowledge of use of DME;Decreased knowledge of  precautions;Pain;Decreased skin integrity       PT Treatment Interventions DME instruction;Gait training;Stair training;Functional mobility training;Therapeutic activities;Therapeutic exercise;Balance training;Patient/family education    PT Goals (Current goals can be found in the Care Plan section)  Acute Rehab PT Goals Patient Stated Goal: to improve mobility PT Goal Formulation: With patient Time For Goal Achievement: 04/06/20 Potential to Achieve Goals: Good    Frequency BID   Barriers to discharge Decreased caregiver support      Co-evaluation               AM-PAC PT "6 Clicks" Mobility  Outcome Measure Help needed turning from your back to your side while in a flat bed without using bedrails?: A Little Help needed moving from lying on your back to sitting on the side of a flat bed without using bedrails?: A Lot Help needed moving to and from a bed to a chair (including a wheelchair)?: A Lot Help needed standing up from a chair using your arms (e.g., wheelchair or bedside chair)?: A Lot Help needed to walk in hospital room?: Total Help needed climbing 3-5 steps with a railing? : Total 6 Click Score: 11    End of Session Equipment Utilized During Treatment: Gait belt Activity Tolerance: Patient tolerated treatment well Patient left: in chair;with call bell/phone within reach;with chair alarm  set;with SCD's reapplied;Other (comment) (B heels floating via pillow support; ice pack to R hip) Nurse Communication: Mobility status;Precautions;Weight bearing status PT Visit Diagnosis: Other abnormalities of gait and mobility (R26.89);Muscle weakness (generalized) (M62.81);History of falling (Z91.81);Difficulty in walking, not elsewhere classified (R26.2);Pain Pain - Right/Left: Right Pain - part of body: Hip    Time: 7972-8206 PT Time Calculation (min) (ACUTE ONLY): 35 min   Charges:   PT Evaluation $PT Eval Low Complexity: 1 Low PT Treatments $Therapeutic Exercise:  8-22 mins       Marky Buresh, PT 03/23/20, 10:14 AM

## 2020-03-23 NOTE — Progress Notes (Signed)
Physical Therapy Treatment Patient Details Name: Rebecca Cruz MRN: [id number] DOB: 08-12-1924 Today's Date: 03/23/2020    History of Present Illness Pt is a 86 y.o. female presenting to hospital 6/27 s/p fall (tripped and fell last night) sustaining R hip pain.  Imaging showing: "Displaced comminuted fracture through the right femoral intertrochanteric region. A nondisplaced fracture through the right inferior pubic ramus is not excluded."  Per MD Posey Pronto note (ortho): "We will additionally plan to treat possible right inferior pubic ramus fracture non-operatively as there are no other signs of significant pelvic ring injury."  Pt s/p IM nailing of R femur with cephalomedullary device 6/28 secondary R intertrochanteric hip fx.  PMH includes htn, thyroid disease, aortic valve repair, appendectomy, abdominal hysterectomy, stage IV CKD, a-fib.    PT Comments    Pt resting in recliner upon PT arrival.  Pt requiring vc's and assist to scoot towards edge of recliner and then to stand up to walker.  Improved ability to shift weight in standing and offload R LE with UE support through walker in order to advance L LE (with moderate cueing).  Pt tending to lean towards L side in sitting d/t R hip pain.  Pt resting in bed with OT present (for OT evaluation) at the end of PT session.  Will continue to focus on strengthening and progressive functional mobility per pt tolerance.   Follow Up Recommendations  SNF     Equipment Recommendations  Rolling walker with 5" wheels;3in1 (PT) (youth sized)    Recommendations for Other Services OT consult     Precautions / Restrictions Precautions Precautions: Fall Restrictions Weight Bearing Restrictions: Yes Other Position/Activity Restrictions: WBAT LE    Mobility  Bed Mobility Overal bed mobility: Needs Assistance Bed Mobility: Sit to Supine       Sit to supine: +2 for physical assistance   General bed mobility comments: 2 assist for trunk and B  LE's sit to semi-supine in bed (and 2 assist to boost pt up in bed using bed sheet)  Transfers Overall transfer level: Needs assistance Equipment used: Rolling walker (2 wheeled) (youth sized) Transfers: Sit to/from Stand Sit to Stand: Min assist         General transfer comment: assist to initiate and come to full stand and also control descent sitting; vc's for UE/LE placement  Ambulation/Gait Ambulation/Gait assistance: Min assist Gait Distance (Feet): 3 Feet Assistive device: Rolling walker (2 wheeled) (youth sized) Gait Pattern/deviations: Antalgic Gait velocity: decreased   General Gait Details: vc's for increasing UE support through walker to offweight R LE (in order to advance L LE); vc's for sequencing and overall technique   Stairs             Wheelchair Mobility    Modified Rankin (Stroke Patients Only)       Balance Overall balance assessment: Needs assistance Sitting-balance support: Bilateral upper extremity supported Sitting balance-Leahy Scale: Fair Sitting balance - Comments: steady static sitting but pt fatigued sitting edge of bed requiring min assist for balance eventually   Standing balance support: Bilateral upper extremity supported Standing balance-Leahy Scale: Poor Standing balance comment: pt requiring B UE support on walker for static standing balance                            Cognition Arousal/Alertness: Awake/alert Behavior During Therapy: WFL for tasks assessed/performed Overall Cognitive Status: Within Functional Limits for tasks assessed  Exercises      General Comments General comments (skin integrity, edema, etc.): mild drainage noted to R hip bandages.  Pt agreeable to PT session.      Pertinent Vitals/Pain Pain Assessment: Faces Faces Pain Scale: Hurts a little bit Pain Location: R hip/thigh Pain Descriptors / Indicators: Discomfort;Sore Pain  Intervention(s): Limited activity within patient's tolerance;Monitored during session;Repositioned  Vitals (HR and O2 on room air) stable and WFL throughout treatment session.    Home Living                      Prior Function            PT Goals (current goals can now be found in the care plan section) Acute Rehab PT Goals Patient Stated Goal: to improve mobility PT Goal Formulation: With patient Time For Goal Achievement: 04/06/20 Potential to Achieve Goals: Good Progress towards PT goals: Progressing toward goals    Frequency    BID      PT Plan Current plan remains appropriate    Co-evaluation              AM-PAC PT "6 Clicks" Mobility   Outcome Measure  Help needed turning from your back to your side while in a flat bed without using bedrails?: A Little Help needed moving from lying on your back to sitting on the side of a flat bed without using bedrails?: A Lot Help needed moving to and from a bed to a chair (including a wheelchair)?: A Little Help needed standing up from a chair using your arms (e.g., wheelchair or bedside chair)?: A Lot Help needed to walk in hospital room?: Total Help needed climbing 3-5 steps with a railing? : Total 6 Click Score: 12    End of Session Equipment Utilized During Treatment: Gait belt Activity Tolerance: Patient tolerated treatment well Patient left: in bed (with OT present for OT evaluation) Nurse Communication: Mobility status;Precautions;Weight bearing status PT Visit Diagnosis: Other abnormalities of gait and mobility (R26.89);Muscle weakness (generalized) (M62.81);History of falling (Z91.81);Difficulty in walking, not elsewhere classified (R26.2);Pain Pain - Right/Left: Right Pain - part of body: Hip     Time: 9150-5697 PT Time Calculation (min) (ACUTE ONLY): 25 min  Charges: $Therapeutic Activity: 23-37 mins                    Leitha Bleak, PT 03/23/20, 4:49 PM

## 2020-03-23 NOTE — TOC Progression Note (Signed)
Transition of Care [facility]) - Progression Note    Patient Details  Name: Leanny Moeckel MRN: [id number] Date of Birth: 10-28-1923  Transition of Care [facility]) CM/SW Contact  Su Hilt, RN Phone Number: 03/23/2020, 10:30 AM  Clinical Narrative:     Met with the patient's son and daughter in law, they have arranged Medical transport to go back to [place] to a rehab facility, The earliest the medical transport can take the patient is Sat, if she can't go Sat then they could not take her until Tuesday due to the Holiday I notified the physician I reached out to [facility] and faxed the referral to [place] at [phone], I reached out to [facility] and rehab and faxed the referral to [phone], I reached out to [facility] and rehab and faxed the information to [phone], I reached out to [facility] and faxed the information to [phone] The patient has had the covid vaccine,   I reached out to [facility] and they do not accept Sat admissions and have no beds available, I left a VM for admissions at [facility] and rehab I am awaiting a response to the facilities with a bed offer        Expected Discharge Plan and Services                                                 Social Determinants of Health (SDOH) Interventions    Readmission Risk Interventions No flowsheet data found.

## 2020-03-23 NOTE — TOC Progression Note (Signed)
Transition of Care [facility]) - Progression Note    Patient Details  Name: Rebecca Cruz MRN: [id number] Date of Birth: June 29, 1924  Transition [facility]) CM/SW Contact  Su Hilt, RN Phone Number: 03/23/2020, 12:07 PM  Clinical Narrative:    Chong Sicilian with [facility] called and made a bed offer I will notify the patient's family        Expected Discharge Plan and Services                                                 Social Determinants of Health (SDOH) Interventions    Readmission Risk Interventions No flowsheet data found.

## 2020-03-23 NOTE — TOC Progression Note (Signed)
Transition of Care [facility]) - Progression Note    Patient Details  Name: Rebecca Cruz MRN: [id number] Date of Birth: 02-14-1924  Transition of Care [facility]) CM/SW Contact  Su Hilt, RN Phone Number: 03/23/2020, 3:43 PM  Clinical Narrative:     Spoke with her son and let him know that she will need follow up appointments for Ortho, He stated that they will call the patient's PCP and get a recommendation and get her an appointment set up in [place] with an orthopedic surgeon        Expected Discharge Plan and Services                                                 Social Determinants of Health (SDOH) Interventions    Readmission Risk Interventions No flowsheet data found.

## 2020-03-23 NOTE — Evaluation (Signed)
Occupational Therapy Evaluation Patient Details Name: Rebecca Cruz MRN: [id number] DOB: December 20, 1923 Today's Date: 03/23/2020    History of Present Illness Pt is a 86 y.o. female presenting to hospital 6/27 s/p fall (tripped and fell last night) sustaining R hip pain.  Imaging showing: "Displaced comminuted fracture through the right femoral intertrochanteric region. A nondisplaced fracture through the right inferior pubic ramus is not excluded."  Per MD Posey Pronto note (ortho): "We will additionally plan to treat possible right inferior pubic ramus fracture non-operatively as there are no other signs of significant pelvic ring injury."  Pt s/p IM nailing of R femur with cephalomedullary device 6/28 secondary R intertrochanteric hip fx.  PMH includes htn, thyroid disease, aortic valve repair, appendectomy, abdominal hysterectomy, stage IV CKD, a-fib.   Clinical Impression   Ms "Rebecca Chafe" Cruz presents sitting EOB with PT at end of session, fatiguing quickly and requiring Min A for UB support to maintain sitting balance. Assist +2 for BLE and trunk mgt for return to supine and boosting up in bed. Pt endorsed 9/10 pain with movement, decreasing to 0/10 with re-positioning. Pt is currently visiting her niece in [place], but lives in [place] with her son and daughter-in-law. Prior to fall leading to hospitalization, pt was independent in ADL, medication mgt; no longer driving and Mod I for ambulation (4WW in community, 2WW/SPC in home). During cognitive screen, pt says year is 2020 and reports concern of decreased memory, but is alert and oriented with no cognitive deficits noted at time of evaluation. ADL and functional mobility deferred during evaluation 2/2 pt fatigue following PT session. Given pt activity tolerance and limited strength/ROM in RLE, anticipate Mod-Max A for seated LB dressing and bathing. Educated pt re: the role of OT in recovery, adaptive strategies for ADL, falls prevention strategies and  discharge planning. Pt will benefit from skilled acute OT services to address deficits in RLE strength/ROM and activity tolerance impacting pt safety and independence during ADL/IADL. Recommend SNF upon discharge to maximize pt outcomes.     Follow Up Recommendations  SNF    Equipment Recommendations  Other (comment) (TBD at next venue)    Recommendations for Other Services       Precautions / Restrictions Precautions Precautions: Fall Restrictions Weight Bearing Restrictions: Yes RLE Weight Bearing: Weight bearing as tolerated Other Position/Activity Restrictions: WBAT LE      Mobility Bed Mobility Overal bed mobility: Needs Assistance Bed Mobility: Sit to Supine       Sit to supine: +2 for physical assistance;Mod assist   General bed mobility comments: 2 assist for trunk and B LE's sit to semi-supine in bed (and 2 assist to boost pt up in bed using bed sheet)  Transfers Overall transfer level: Needs assistance Equipment used: Rolling walker (2 wheeled) (youth sized) Transfers: Sit to/from Stand Sit to Stand: Min assist         General transfer comment: deferred 2/2 pt fatigue following PT session    Balance Overall balance assessment: Needs assistance Sitting-balance support: Bilateral upper extremity supported;Feet supported Sitting balance-Leahy Scale: Fair Sitting balance - Comments: steady static sitting but pt fatigued sitting edge of bed requiring min assist for balance eventually   Standing balance support: Bilateral upper extremity supported Standing balance-Leahy Scale: Poor Standing balance comment: unobserved                           ADL either performed or assessed with clinical judgement   ADL Overall ADL's :  Needs assistance/impaired                                       General ADL Comments: Observed pt sitting at EOB at end of PT session; pt requires support for sitting EOB 2/2 fatigue. Functional mobility and  ADL deferred 2/2 pt fatigue. Given pt activity tolerance and limited strength/ROM in RLE, anticipate Mod-Max A for seated LB dressing and bathing.     Vision Baseline Vision/History: Wears glasses Wears Glasses: At all times       Perception     Praxis      Pertinent Vitals/Pain Pain Assessment: 0-10 Pain Score: 0-No pain (at rest, 9/10 with movement) Faces Pain Scale: Hurts a little bit Pain Location: R hip/thigh Pain Descriptors / Indicators: Discomfort;Sore Pain Intervention(s): Limited activity within patient's tolerance;Monitored during session;Repositioned     Hand Dominance     Extremity/Trunk Assessment Upper Extremity Assessment Upper Extremity Assessment: Generalized weakness;Overall [facility] for tasks assessed   Lower Extremity Assessment Lower Extremity Assessment: Defer to PT evaluation       Communication Communication Communication: HOH   Cognition Arousal/Alertness: Awake/alert Behavior During Therapy: WFL for tasks assessed/performed Overall Cognitive Status: Within Functional Limits for tasks assessed                                 General Comments: Pt alert and oriented to self, birth date, current month and city. She said the year was 2020 and reported that her memory has gotten worse lately (difficulties remembering past events). WFL during session.   General Comments  mild drainage noted to R hip bandages    Exercises Other Exercises Other Exercises: Pt educated re: the role of OT in recovery, adaptive strategies for ADL, falls prevention strategies and discharge planning. Other Exercises: Facilitated pt re-positioning in bed for pain mgt   Shoulder Instructions      Home Living Family/patient expects to be discharged to:: Private residence Living Arrangements: Children;Other relatives (pt lives with her son and DIL in [place], currently visiting her niece in [place]) Available Help at Discharge: Family;Available 24 hours/day Type of  Home: House             Bathroom Shower/Tub: Walk-in [employer] Toilet: Standard     Home Equipment: Grab bars - toilet;Shower seat   Additional Comments: From [place] (lives with son).  Currently staying with niece (just got there and was planning on staying for a few weeks).  Pt reports staying on main floor but did not remember home set-up.      Prior Functioning/Environment Level of Independence: Independent with assistive device(s)        Comments: Pt used a 4WW for community ambulation and 2WW/SPC for household ambulation. Reports no falls, "but a couple of almost ones," other than fall that led to hospitalization. She said she had "felt funny" the day leading up to the fall. Pt was independent in ADL and medication management, no longer drives. Was having increased difficulty getting in/out of car.        OT Problem List: Decreased strength;Decreased range of motion;Decreased activity tolerance;Impaired balance (sitting and/or standing);Decreased knowledge of use of DME or AE;Pain      OT Treatment/Interventions: Self-care/ADL training;Therapeutic exercise;Therapeutic activities;DME and/or AE instruction;Patient/family education;Balance training    OT Goals(Current goals can be found in the  care plan section) Acute Rehab OT Goals Patient Stated Goal: To be able to move better OT Goal Formulation: With patient Time For Goal Achievement: 04/06/20 Potential to Achieve Goals: Good ADL Goals Pt Will Perform Lower Body Dressing: sit to/from stand;with adaptive equipment;with min assist (using LRAD) Pt Will Transfer to Toilet: ambulating;with min guard assist;bedside commode;regular height toilet (BSC over toilet, using LRAD) Additional ADL Goal #1: Pt will verbalize >3 falls prevention strategies during ADL performance in order to maximize pt safety  OT Frequency: Min 2X/week   Barriers to D/C:            Co-evaluation              AM-PAC OT "6 Clicks"  Daily Activity     Outcome Measure Help from another person eating meals?: None Help from another person taking care of personal grooming?: None Help from another person toileting, which includes using toliet, bedpan, or urinal?: A Lot Help from another person bathing (including washing, rinsing, drying)?: A Lot Help from another person to put on and taking off regular upper body clothing?: A Little Help from another person to put on and taking off regular lower body clothing?: A Lot 6 Click Score: 17   End of Session    Activity Tolerance: Patient tolerated treatment well;Patient limited by fatigue Patient left: in bed;with bed alarm set;with call bell/phone within reach;with SCD's reapplied  OT Visit Diagnosis: Other abnormalities of gait and mobility (R26.89);History of falling (Z91.81);Muscle weakness (generalized) (M62.81);Pain Pain - Right/Left: Right Pain - part of body: Hip                Time: 3976-7341 OT Time Calculation (min): 29 min Charges:  OT General Charges $OT Visit: 1 Visit OT Evaluation $OT Eval Moderate Complexity: 1 Mod OT Treatments $Self Care/Home Management : 8-22 mins  Jerilynn Birkenhead, OTS 03/23/20, 3:12 PM

## 2020-03-23 NOTE — Plan of Care (Signed)

## 2020-03-24 DIAGNOSIS — D62 Acute posthemorrhagic anemia: Secondary | ICD-10-CM

## 2020-03-24 LAB — BASIC METABOLIC PANEL
Anion gap: 7 (ref 5–15)
BUN: 58 mg/dL — ABNORMAL HIGH (ref 8–23)
CO2: 23 mmol/L (ref 22–32)
Calcium: 7.7 mg/dL — ABNORMAL LOW (ref 8.9–10.3)
Chloride: 108 mmol/L (ref 98–111)
Creatinine, Ser: 1.27 mg/dL — ABNORMAL HIGH (ref 0.44–1.00)
GFR calc Af Amer: 42 mL/min — ABNORMAL LOW (ref >60)
GFR calc non Af Amer: 36 mL/min — ABNORMAL LOW (ref >60)
Glucose, Bld: 119 mg/dL — ABNORMAL HIGH (ref 70–99)
Potassium: 4.5 mmol/L (ref 3.5–5.1)
Sodium: 138 mmol/L (ref 135–145)

## 2020-03-24 LAB — PROCALCITONIN: Procalcitonin: 0.45 ng/mL

## 2020-03-24 LAB — CBC
HCT: 23.7 % — ABNORMAL LOW (ref 36.0–46.0)
Hemoglobin: 8.1 g/dL — ABNORMAL LOW (ref 12.0–15.0)
MCH: 32.4 pg (ref 26.0–34.0)
MCHC: 34.2 g/dL (ref 30.0–36.0)
MCV: 94.8 fL (ref 80.0–100.0)
Platelets: 327 10*3/uL (ref 150–400)
RBC: 2.5 MIL/uL — ABNORMAL LOW (ref 3.87–5.11)
RDW: 16.2 % — ABNORMAL HIGH (ref 11.5–15.5)
WBC: 32.6 10*3/uL — ABNORMAL HIGH (ref 4.0–10.5)
nRBC: 0.3 % — ABNORMAL HIGH (ref 0.0–0.2)

## 2020-03-24 LAB — MAGNESIUM: Magnesium: 2 mg/dL (ref 1.7–2.4)

## 2020-03-24 MED ORDER — POLYETHYLENE GLYCOL 3350 17 G PO PACK
17.0000 g | PACK | Freq: Every day | ORAL | Status: DC
  Administered 2020-03-24 – 2020-03-25 (×2): 17 g via ORAL
  Filled 2020-03-24 (×2): qty 1

## 2020-03-24 MED ORDER — SODIUM CHLORIDE 0.9 % IV SOLN: INTRAVENOUS | Status: DC

## 2020-03-24 NOTE — Progress Notes (Signed)
PROGRESS NOTE   Rebecca Cruz  BHA:[id number]    DOB: [birth year]    DOA: 03/21/2020  PCP: Patient, No Pcp Per   I have briefly reviewed patients previous medical records in [facility].  Chief Complaint  Patient presents with  . Fall    Brief Narrative:  86 year old female with PMH of hypertension, hypothyroidism, stage IV chronic kidney disease, atrial fibrillation on chronic anticoagulation, originally from [place], visiting family in the [place] area, presented to ED via EMS for evaluation of right hip pain.  S/p IM intertrochanteric nail on 6/28 for intertrochanteric fracture of right femur.  Progressing slowly.  AKI improving.  Plans for DC to SNF in [place] when stable, likely 7/3.   Assessment & Plan:  Principal Problem:   Intertrochanteric fracture of right femur [facility]) Active Problems:   Hypertension   Thyroid disease   Atrial fibrillation with RVR (HCC)   Intertrochanteric fracture of right femur, closed, initial encounter ([place])   Chronic kidney disease, stage 4 (severe) (HCC)   Protein-calorie malnutrition, severe   Intertrochanteric fracture of right femur, s/p IM nail 6/28.  Sustained status post fall and had a displaced comminuted fracture.  Orthopedics follow-up appreciated.  Pain appears to be reasonably controlled.  Progressing slowly with physical therapy this morning.  WBAT to right lower extremity.  Although she may follow-up with [facility] clinic orthopedics in 2 weeks for staple removal, since she is going to [facility] and [place], will need to set up follow-up with orthopedics MD locally.  AKI complicating stage IV chronic kidney disease: Not present on admission.  Creatinine peaked to 1.42 on 6/29.  Better today at 1.27.  Continue gentle IV fluids for additional 24 hours and follow BMP.  Avoid nephrotoxic.  History of atrial fibrillation: Continue metoprolol and Eliquis.  Essential hypertension: Controlled on Toprol-XL.  Losartan held due to  AKI.  Hypothyroid: Continue Synthroid.  Postop acute blood loss anemia: Hemoglobin down from 11.2-8.1 but stable compared to yesterday.  Follow CBC in a.m. and transfuse if hemoglobin 7 g or less.  Leukocytosis: WBC 18 on presentation which is progressively increased, 22.3 > 26.7 > 32.6.?  Stress response.  No overt features of infectious cause.  As per family's report to prior MD, has history of elevated WBC.  Holding antibiotics.  However review in care everywhere shows WBC in the 11-12 range in 2019 and 2020.  Follow CBC in a.m. and if persistently elevated, consider hematology consultation.  Also check peripheral smear.  Body mass index is 22.19 kg/m.  Nutritional Status Nutrition Problem: Severe Malnutrition Etiology: chronic illness (CKD) Signs/Symptoms: severe fat depletion, severe muscle depletion Interventions: Refer to RD note for recommendations  DVT prophylaxis: Eliquis Code Status: DNR Family Communication: None at bedside Disposition:  Status is: Inpatient  Remains inpatient appropriate because:Inpatient level of care appropriate due to severity of illness   Dispo: The patient is from: Home              Anticipated d/c is to: SNF              Anticipated d/c date is: 3 days.  Likely 7/3 to an out-of-state SNF in [place].              Patient currently is not medically stable to d/c.        Consultants:   Orthopedic  Procedures:   As above  Antimicrobials:   None   Subjective:  Interviewed and examined along with PT in the room.  Patient reports some pain at the right hip surgical site.  As per PT, did better today than she did yesterday with some pain but ambulated a little better.  No BM for a couple of days.  No abdominal pain.  Objective:   Vitals:   03/23/20 1550 03/23/20 1946 03/24/20 0007 03/24/20 0738  BP: 131/61 126/60 (!) 110/53 (!) 109/58  Pulse: 91 87 82 67  Resp: 16 18 17 18   Temp: 98.5 F (36.9 C) 98 F (36.7 C) 98.3 F (36.8 C)  97.7 F (36.5 C)  TempSrc: Oral   Oral  SpO2: 97% 97% 97% 97%  Weight:      Height:        General exam: Elderly female, moderately built and frail, seen standing up with the help of a walker and PT supervision/assistance Respiratory system: Clear to auscultation. Respiratory effort normal. Cardiovascular system: S1 & S2 heard, irregularly irregular. No JVD, murmurs, rubs, gallops or clicks. No pedal edema.  Not on telemetry Gastrointestinal system: Abdomen is nondistended, soft and nontender. No organomegaly or masses felt. Normal bowel sounds heard. Central nervous system: Alert and oriented. No focal neurological deficits. Extremities: Symmetric 5 x 5 power.  Right hip surgical site dressing with minimal fresh blood soaking-as per PT, dressing was just changed short while prior. Skin: No rashes, lesions or ulcers Psychiatry: Judgement and insight appear normal. Mood & affect appropriate.     Data Reviewed:   I have personally reviewed following labs and imaging studies   CBC: Recent Labs  Lab 03/22/20 0546 03/23/20 0345 03/24/20 0345  WBC 22.3* 26.7* 32.6*  HGB 11.2* 8.9* 8.1*  HCT 34.2* 27.9* 23.7*  MCV 96.1 98.6 94.8  PLT 293 295 664    Basic Metabolic Panel: Recent Labs  Lab 03/22/20 0546 03/23/20 0345 03/24/20 0345  NA 140 137 138  K 3.9 4.6 4.5  CL 106 105 108  CO2 23 25 23   GLUCOSE 123* 174* 119*  BUN 34* 51* 58*  CREATININE 0.93 1.42* 1.27*  CALCIUM 8.8* 8.0* 7.7*  MG  --  1.8 2.0    Liver Function Tests: No results for input(s): AST, ALT, ALKPHOS, BILITOT, PROT, ALBUMIN in the last 168 hours.  CBG: No results for input(s): GLUCAP in the last 168 hours.  Microbiology Studies:   Recent Results (from the past 240 hour(s))  SARS Coronavirus 2 by RT PCR (hospital order, performed in [facility] hospital lab) Nasopharyngeal Nasopharyngeal Swab     Status: None   Collection Time: 03/21/20  8:06 AM   Specimen: Nasopharyngeal Swab  Result Value Ref  Range Status   SARS Coronavirus 2 NEGATIVE NEGATIVE Final    Comment: (NOTE) SARS-CoV-2 target nucleic acids are NOT DETECTED.  The SARS-CoV-2 RNA is generally detectable in upper and lower respiratory specimens during the acute phase of infection. The lowest concentration of SARS-CoV-2 viral copies this assay can detect is 250 copies / mL. A negative result does not preclude SARS-CoV-2 infection and should not be used as the sole basis for treatment or other patient management decisions.  A negative result may occur with improper specimen collection / handling, submission of specimen other than nasopharyngeal swab, presence of viral mutation(s) within the areas targeted by this assay, and inadequate number of viral copies (<250 copies / mL). A negative result must be combined with clinical observations, patient history, and epidemiological information.  Fact Sheet for Patients:   StrictlyIdeas.no  Fact Sheet for Healthcare Providers: BankingDealers.co.za  This test is  not yet approved or  cleared by the [place] and has been authorized for detection and/or diagnosis of SARS-CoV-2 by FDA under an Emergency Use Authorization (EUA).  This EUA will remain in effect (meaning this test can be used) for the duration of the COVID-19 declaration under Section 564(b)(1) of the Act, 21 U.S.C. section 360bbb-3(b)(1), unless the authorization is terminated or revoked sooner.  Performed at [facility], [address], [place], [place] [address]   Surgical PCR screen     Status: None   Collection Time: 03/21/20 12:15 PM   Specimen: Nasal Mucosa; Nasal Swab  Result Value Ref Range Status   MRSA, PCR NEGATIVE NEGATIVE Final   Staphylococcus aureus NEGATIVE NEGATIVE Final    Comment: (NOTE) The Xpert SA Assay (FDA approved for NASAL specimens in patients 29 years of age and older), is one component of a  comprehensive surveillance program. It is not intended to diagnose infection nor to guide or monitor treatment. Performed at [facility], [address], [place], [place] [address]      Radiology Studies:  No results found.   Scheduled Meds:   . apixaban  2.5 mg Oral BID  . calcium acetate  667 mg Oral BID WC  . Chlorhexidine Gluconate Cloth  6 each Topical Daily  . docusate sodium  100 mg Oral BID  . feeding supplement (ENSURE ENLIVE)  237 mL Oral BID BM  . levothyroxine  75 mcg Oral Q0600  . loratadine  10 mg Oral Daily  . metoprolol succinate  50 mg Oral Daily  . polyethylene glycol  17 g Oral Daily    Continuous Infusions:   . sodium chloride 100 mL/hr at 03/24/20 0523  . methocarbamol (ROBAXIN) IV       LOS: 3 days     Vernell Leep, MD, [place], [facility]. [facility]    To contact the attending provider between 7A-7P or the covering provider during after hours 7P-7A, please log into the web site [email or web address] and access using universal [place] password for that web site. If you do not have the password, please call the hospital operator.  03/24/2020, 5:01 PM

## 2020-03-24 NOTE — TOC Progression Note (Signed)
Transition of Care [facility]) - Progression Note    Patient Details  Name: Rebecca Cruz MRN: [id number] Date of Birth: November 06, 1923  Transition of Care [facility]) CM/SW Contact  Su Hilt, RN Phone Number: 03/24/2020, 4:23 PM  Clinical Narrative:    Spoke with the son and he has chosen [facility] and asked that I call [facility] and [facility] the bed offer, I called [facility] and left a voice mail for Belinda Block in admission that they are declining I called Janett Billow at [facility] and accepted the bed offer, Transport will pick the patient up here at the hospital at 2 PM and transport to [place], [facility] is aware of the late admission and they accepted it, The patient will need a covid trest prior to DC on Sat, The DC packet will need to be faxed to [phone] and need printed and sent to them as well in the DC folder,        Expected Discharge Plan and Services                                                 Social Determinants of Health (SDOH) Interventions    Readmission Risk Interventions No flowsheet data found.

## 2020-03-24 NOTE — Progress Notes (Signed)
Physical Therapy Treatment Patient Details Name: Rebecca Cruz MRN: [id number] DOB: 08-12-1924 Today's Date: 03/24/2020    History of Present Illness Pt is a 86 y.o. female presenting to hospital 6/27 s/p fall (tripped and fell last night) sustaining R hip pain.  Imaging showing: "Displaced comminuted fracture through the right femoral intertrochanteric region. A nondisplaced fracture through the right inferior pubic ramus is not excluded."  Per MD Posey Pronto note (ortho): "We will additionally plan to treat possible right inferior pubic ramus fracture non-operatively as there are no other signs of significant pelvic ring injury."  Pt s/p IM nailing of R femur with cephalomedullary device 6/28 secondary R intertrochanteric hip fx.  PMH includes htn, thyroid disease, aortic valve repair, appendectomy, abdominal hysterectomy, stage IV CKD, a-fib.    PT Comments    Pt standing with walker (with pt's nurse) starting to transfer back to bed upon PT arrival but pt having difficulty taking steps d/t R hip pain.  Vc's required for transfer technique and walker use.  Min assist to control descent sitting down onto bed.  Pt then assisted back to bed and tolerated LE ex's fairly well with assist for R LE.  Pt reporting 8/10 R hip pain end of session (nurse notified and brought pt pain medication).  Will continue to focus on strengthening and progressive functional mobility per pt tolerance.   Follow Up Recommendations  SNF     Equipment Recommendations  Rolling walker with 5" wheels;3in1 (PT) (youth sized)    Recommendations for Other Services OT consult     Precautions / Restrictions Precautions Precautions: Fall Restrictions Weight Bearing Restrictions: Yes Other Position/Activity Restrictions: WBAT LE    Mobility  Bed Mobility Overal bed mobility: Needs Assistance Bed Mobility: Sit to Supine     Sit to supine: Min assist;Mod assist;+2 for physical assistance   General bed mobility  comments: assist for trunk and R>L LE sit to semi-supine in bed; 2 assist to boost pt up in bed via bed sheet  Transfers Overall transfer level: Needs assistance Equipment used: Rolling walker (2 wheeled) (youth sized) Transfers:  (stand to sit) Sit to Stand:  (stand to sit min assist to control descent)       General transfer comment: switched to youth sized walker to improve ability to off-weight R LE via UE support through walker.   Ambulation/Gait Ambulation/Gait assistance: Min assist;Mod assist Gait Distance (Feet): 3 Feet Assistive device: Rolling walker (2 wheeled) (youth sized) Gait Pattern/deviations: Antalgic Gait velocity: decreased   General Gait Details: vc's to increase UE support through walker to offweight R LE (in order to advance L LE); vc's for sequencing; assist with walker management   Stairs             Wheelchair Mobility    Modified Rankin (Stroke Patients Only)       Balance Overall balance assessment: Needs assistance Sitting-balance support: No upper extremity supported Sitting balance-Leahy Scale: Fair Sitting balance - Comments: steady static sitting (mild L lean d/t R hip/thigh pain)   Standing balance support: Bilateral upper extremity supported Standing balance-Leahy Scale: Poor Standing balance comment: pt requiring B UE support on walker for static standing balance                            Cognition Arousal/Alertness: Awake/alert Behavior During Therapy: WFL for tasks assessed/performed Overall Cognitive Status: Within Functional Limits for tasks assessed  Exercises Total Joint Exercises Ankle Circles/Pumps: AROM;Strengthening;Both;10 reps;Supine Quad Sets: AROM;Strengthening;Both;10 reps;Supine Short Arc Quad: AAROM;Strengthening;Right;10 reps;Supine Heel Slides: AAROM;Strengthening;Right;10 reps;Supine Hip ABduction/ADduction:  AAROM;Strengthening;Right;10 reps;Supine    General Comments General comments (skin integrity, edema, etc.): mild drainage noted R anterior hip and R lateral thigh dressings.  Nursing cleared pt for participation in physical therapy.  Pt agreeable to PT session.      Pertinent Vitals/Pain Pain Assessment: 0-10 Pain Score: 8  Pain Location: R hip/thigh Pain Descriptors / Indicators: Aching;Sore;Tender Pain Intervention(s): Limited activity within patient's tolerance;Monitored during session;Repositioned;Patient requesting pain meds-RN notified;Other (comment) (RN gave pain medication end of session)    Home Living                      Prior Function            PT Goals (current goals can now be found in the care plan section) Acute Rehab PT Goals Patient Stated Goal: To be able to move better PT Goal Formulation: With patient Time For Goal Achievement: 04/06/20 Potential to Achieve Goals: Good Progress towards PT goals: Progressing toward goals    Frequency    BID      PT Plan Current plan remains appropriate    Co-evaluation              AM-PAC PT "6 Clicks" Mobility   Outcome Measure  Help needed turning from your back to your side while in a flat bed without using bedrails?: A Little Help needed moving from lying on your back to sitting on the side of a flat bed without using bedrails?: A Lot Help needed moving to and from a bed to a chair (including a wheelchair)?: A Lot Help needed standing up from a chair using your arms (e.g., wheelchair or bedside chair)?: A Lot Help needed to walk in hospital room?: A Lot Help needed climbing 3-5 steps with a railing? : Total 6 Click Score: 12    End of Session Equipment Utilized During Treatment: Gait belt Activity Tolerance: Patient limited by pain Patient left: in bed;with call bell/phone within reach;with bed alarm set;with nursing/sitter in room;with family/visitor present;with SCD's reapplied;Other  (comment) (bed in lowest position; B heels floating via pillow support) Nurse Communication: Mobility status;Patient requests pain meds;Precautions;Weight bearing status PT Visit Diagnosis: Other abnormalities of gait and mobility (R26.89);Muscle weakness (generalized) (M62.81);History of falling (Z91.81);Difficulty in walking, not elsewhere classified (R26.2);Pain Pain - Right/Left: Right Pain - part of body: Hip     Time: 1345-1409 PT Time Calculation (min) (ACUTE ONLY): 24 min  Charges:  $Therapeutic Exercise: 8-22 mins $Therapeutic Activity: 8-22 mins                     Leitha Bleak, PT 03/24/20, 2:31 PM

## 2020-03-24 NOTE — TOC Progression Note (Signed)
Transition of Care [facility]) - Progression Note    Patient Details  Name: Rebecca Cruz MRN: [id number] Date of Birth: October 17, 1923  Transition of Care [facility]) CM/SW [place], RN Phone Number: 03/24/2020, 12:24 PM  Clinical Narrative:     Meda Coffee at [facility] and inquiring about the bed offer She stated that she had not received the fax, I refaxed the information and called asking that she could verify that she did or did not get it, I called the patient's son Rebecca Cruz and let him know we need to know if they would like to accept the bed offer for [facility], He stated that he and his wife will tour the facility today and then call me and let me know if they are accepting       Expected Discharge Plan and Services                                                 Social Determinants of Health (SDOH) Interventions    Readmission Risk Interventions No flowsheet data found.

## 2020-03-24 NOTE — Anesthesia Postprocedure Evaluation (Signed)
Anesthesia Post Note  Patient: Rebecca Cruz  Procedure(s) Performed: INTRAMEDULLARY (IM) NAIL INTERTROCHANTRIC (Right Hip)  Patient location during evaluation: PACU Anesthesia Type: General Level of consciousness: awake and alert and oriented Pain management: pain level controlled Vital Signs Assessment: post-procedure vital signs reviewed and stable Respiratory status: spontaneous breathing Cardiovascular status: blood pressure returned to baseline Anesthetic complications: no   No complications documented.   Last Vitals:  Vitals:   03/24/20 0007 03/24/20 0738  BP: (!) 110/53 (!) 109/58  Pulse: 82 67  Resp: 17 18  Temp: 36.8 C 36.5 C  SpO2: 97% 97%    Last Pain:  Vitals:   03/24/20 1508  TempSrc:   PainSc: Asleep                 Mariellen Blaney

## 2020-03-24 NOTE — Progress Notes (Signed)
  Subjective: 2 Days Post-Op Procedure(s) (LRB): INTRAMEDULLARY (IM) NAIL INTERTROCHANTRIC (Right) Patient reports pain as mild.   Patient is well, and has had no acute complaints or problems Plan is to go Rehab after hospital stay. Negative for chest pain and shortness of breath Fever: no Gastrointestinal: Negative for nausea and vomiting  Objective: Vital signs in last 24 hours: Temp:  [97.9 F (36.6 C)-98.5 F (36.9 C)] 98.3 F (36.8 C) (06/30 0007) Pulse Rate:  [60-91] 82 (06/30 0007) Resp:  [14-18] 17 (06/30 0007) BP: (103-131)/(49-61) 110/53 (06/30 0007) SpO2:  [96 %-97 %] 97 % (06/30 0007)  Intake/Output from previous day:  Intake/Output Summary (Last 24 hours) at 03/24/2020 0710 Last data filed at 03/24/2020 0522 Gross per 24 hour  Intake 2852.67 ml  Output 550 ml  Net 2302.67 ml    Intake/Output this shift: No intake/output data recorded.  Labs: Recent Labs    03/21/20 0806 03/22/20 0546 03/23/20 0345 03/24/20 0345  HGB 14.9 11.2* 8.9* 8.1*   Recent Labs    03/23/20 0345 03/24/20 0345  WBC 26.7* 32.6*  RBC 2.83* 2.50*  HCT 27.9* 23.7*  PLT 295 327   Recent Labs    03/23/20 0345 03/24/20 0345  NA 137 138  K 4.6 4.5  CL 105 108  CO2 25 23  BUN 51* 58*  CREATININE 1.42* 1.27*  GLUCOSE 174* 119*  CALCIUM 8.0* 7.7*   No results for input(s): LABPT, INR in the last 72 hours.   EXAM General - Patient is Alert and Oriented Extremity - Neurovascular intact Sensation intact distally Dorsiflexion/Plantar flexion intact Compartment soft Dressing/Incision -mild hemorrhagic drainage at the mid incision.  No change this morning.  It will be changed before discharge. Motor Function - intact, moving foot and toes well on exam.   Past Medical History:  Diagnosis Date  . Hypertension   . Thyroid disease     Assessment/Plan: 2 Days Post-Op Procedure(s) (LRB): INTRAMEDULLARY (IM) NAIL INTERTROCHANTRIC (Right) Principal Problem:    Intertrochanteric fracture of right femur (HCC) Active Problems:   Hypertension   Thyroid disease   Atrial fibrillation with RVR (HCC)   Intertrochanteric fracture of right femur, closed, initial encounter ([place])   Chronic kidney disease, stage 4 (severe) (HCC)   Protein-calorie malnutrition, severe  Estimated body mass index is 22.19 kg/m as calculated from the following:   Height as of this encounter: 5' (1.524 m).   Weight as of this encounter: 51.5 kg. Advance diet Up with therapy D/C IV fluids  Follow-up at [facility] clinic orthopedics in 2 weeks for staple removal.  DVT Prophylaxis - Foot Pumps, TED hose and Eliquis Weight-Bearing as tolerated to right leg  Reche [name], PA-C Orthopaedic Surgery 03/24/2020, 7:10 AM

## 2020-03-24 NOTE — TOC Progression Note (Signed)
Transition of Care [facility]) - Progression Note    Patient Details  Name: Rebecca Cruz MRN: [id number] Date of Birth: 1924/04/11  Transition of Care [facility]) CM/SW [place], RN Phone Number: 03/24/2020, 1:49 PM  Clinical Narrative:     Vikki Ports with [facility] called and said that they reviewed the referral and they are potentially going to make an offer however they will call me with a certain answer soon,  They will need for there to be a Covid test within 24 hours of DC and a paper packet as well as faxed DC packet to them fax number [phone]      Expected Discharge Plan and Services                                                 Social Determinants of Health (SDOH) Interventions    Readmission Risk Interventions No flowsheet data found.

## 2020-03-24 NOTE — TOC Progression Note (Signed)
Transition of Care [facility]) - Progression Note    Patient Details  Name: Clemmie Buelna MRN: [id number] Date of Birth: 1924/04/26  Transition of Care [facility]) CM/SW Contact  Su Hilt, RN Phone Number: 03/24/2020, 3:45 PM  Clinical Narrative:    Spoke with the son and daughter in law on the phone, they have toured [facility] and would like to accept the bed offer As we were on the phone I received a call from [facility] offering a bed, I let Eddie Dibbles know and he wanted to go tour the facility, I explained that [place] from [facility] explained that they are not doing tours due to Covid but she will call him, He agreed and will call me back after speaking with her, I let Eddie Dibbles Know that Lindsey had scheduled fore the patient to be picked up from the hospital at 2 PM sat, I let him know that the family will need to arrange an earlier transport for her to be picked up< I will notify the physician once I know what time the transport is scheduled so that the DC can be ready        Expected Discharge Plan and Services                                                 Social Determinants of Health (SDOH) Interventions    Readmission Risk Interventions No flowsheet data found.

## 2020-03-24 NOTE — Progress Notes (Signed)
Physical Therapy Treatment Patient Details Name: Rebecca Cruz MRN: [id number] DOB: 1923-10-19 Today's Date: 03/24/2020    History of Present Illness Pt is a 86 y.o. female presenting to hospital 6/27 s/p fall (tripped and fell last night) sustaining R hip pain.  Imaging showing: "Displaced comminuted fracture through the right femoral intertrochanteric region. A nondisplaced fracture through the right inferior pubic ramus is not excluded."  Per MD Posey Pronto note (ortho): "We will additionally plan to treat possible right inferior pubic ramus fracture non-operatively as there are no other signs of significant pelvic ring injury."  Pt s/p IM nailing of R femur with cephalomedullary device 6/28 secondary R intertrochanteric hip fx.  PMH includes htn, thyroid disease, aortic valve repair, appendectomy, abdominal hysterectomy, stage IV CKD, a-fib.    PT Comments    Pt resting in bed upon PT arrival; agreeable to PT session.  Tolerated LE ex's in bed fairly well with assist for R LE ex's.  Mod assist to stand from bed and min assist to ambulate 5 feet with youth sized RW (limited distance ambulating d/t R hip/thigh pain).  During ambulation, improved ability noted to offload R LE via UE support on walker but pt still required cueing and assist to advance walker.  2-3/10 R hip/thigh pain end of session at rest.  Will continue to focus on strengthening and progressive functional mobility per pt tolerance.    Follow Up Recommendations  SNF     Equipment Recommendations  Rolling walker with 5" wheels;3in1 (PT) (youth sized)    Recommendations for Other Services OT consult     Precautions / Restrictions Precautions Precautions: Fall Restrictions Weight Bearing Restrictions: Yes RLE Weight Bearing: Weight bearing as tolerated Other Position/Activity Restrictions: WBAT LE    Mobility  Bed Mobility Overal bed mobility: Needs Assistance Bed Mobility: Supine to Sit     Supine to sit: Mod  assist;HOB elevated     General bed mobility comments: assist for trunk and R LE semi-supine to sitting edge of bed; vc's for bedrail use and overall technique  Transfers Overall transfer level: Needs assistance Equipment used: Rolling walker (2 wheeled) (youth sized) Transfers: Sit to/from [facility] Sit to Stand: Mod assist Stand pivot transfers: Min assist (stand step turn bed to recliner with walker and vc's for technique)       General transfer comment: mod assist to initiate stand up to RW from bed and min assist to control descent sitting down in recliner; vc's for UE/LE placement and overall technique  Ambulation/Gait Ambulation/Gait assistance: Min assist Gait Distance (Feet): 5 Feet   Gait Pattern/deviations: Antalgic Gait velocity: decreased   General Gait Details: occasional vc's for increasing UE support through walker to offweight R LE (in order to advance L LE); vc's for sequencing; assist with walker advancement   Stairs             Wheelchair Mobility    Modified Rankin (Stroke Patients Only)       Balance Overall balance assessment: Needs assistance Sitting-balance support: No upper extremity supported Sitting balance-Leahy Scale: Fair Sitting balance - Comments: steady static sitting (mild L lean d/t R hip/thigh pain)   Standing balance support: Bilateral upper extremity supported Standing balance-Leahy Scale: Poor Standing balance comment: pt requiring B UE support on walker for static standing balance                            Cognition Arousal/Alertness: Awake/alert Behavior During Therapy:  WFL for tasks assessed/performed Overall Cognitive Status: Within Functional Limits for tasks assessed                                        Exercises Total Joint Exercises Ankle Circles/Pumps: AROM;Strengthening;Both;10 reps;Supine Quad Sets: AROM;Strengthening;Both;10 reps;Supine Short Arc Quad:  AAROM;Strengthening;Right;10 reps;Supine Heel Slides: AAROM;Strengthening;Right;10 reps;Supine Hip ABduction/ADduction: AAROM;Strengthening;Right;10 reps;Supine    General Comments General comments (skin integrity, edema, etc.): anterior dressing noted to be soaked with red drainage and honeycomb dressing unable to absorb anymore: nurse notified and came to assess and then changed pt's R hip/thigh dressings.  Nursing cleared pt for participation in physical therapy.  Pt agreeable to PT session.      Pertinent Vitals/Pain Pain Assessment: 0-10 Pain Score: 3  Pain Location: R hip/thigh Pain Descriptors / Indicators: Discomfort;Sore Pain Intervention(s): Limited activity within patient's tolerance;Monitored during session;Repositioned;Other (comment) (nurse notified)  Vitals (HR and O2 on room air) stable and WFL throughout treatment session.    Home Living                      Prior Function            PT Goals (current goals can now be found in the care plan section) Acute Rehab PT Goals Patient Stated Goal: To be able to move better PT Goal Formulation: With patient Time For Goal Achievement: 04/06/20 Potential to Achieve Goals: Good Progress towards PT goals: Progressing toward goals    Frequency    BID      PT Plan Current plan remains appropriate    Co-evaluation              AM-PAC PT "6 Clicks" Mobility   Outcome Measure  Help needed turning from your back to your side while in a flat bed without using bedrails?: A Little Help needed moving from lying on your back to sitting on the side of a flat bed without using bedrails?: A Lot Help needed moving to and from a bed to a chair (including a wheelchair)?: A Little Help needed standing up from a chair using your arms (e.g., wheelchair or bedside chair)?: A Lot Help needed to walk in hospital room?: A Little Help needed climbing 3-5 steps with a railing? : Total 6 Click Score: 14    End of  Session Equipment Utilized During Treatment: Gait belt Activity Tolerance: Patient limited by pain Patient left: in chair;with call bell/phone within reach;with chair alarm set;with SCD's reapplied;Other (comment) (B heels floating via pillow support) Nurse Communication: Mobility status;Precautions;Weight bearing status;Other (comment) (pt's pain status) PT Visit Diagnosis: Other abnormalities of gait and mobility (R26.89);Muscle weakness (generalized) (M62.81);History of falling (Z91.81);Difficulty in walking, not elsewhere classified (R26.2);Pain Pain - Right/Left: Right Pain - part of body: Hip     Time: 0920-0952 PT Time Calculation (min) (ACUTE ONLY): 32 min  Charges:  $Therapeutic Exercise: 8-22 mins $Therapeutic Activity: 8-22 mins                     Leitha Bleak, PT 03/24/20, 10:53 AM

## 2020-03-24 NOTE — Care Management Important Message (Signed)
Important Message  Patient Details  Name: Rebecca Cruz MRN: [id number] Date of Birth: 07/08/1924   Medicare Important Message Given:  N/A - LOS <3 / Initial given by admissions     [name] A Alexius Ellington 03/24/2020, 2:22 PM

## 2020-03-24 NOTE — TOC Progression Note (Signed)
Transition of Care [facility]) - Progression Note    Patient Details  Name: Rebecca Cruz MRN: [id number] Date of Birth: Dec 30, 1923  Transition of Care [facility]) CM/SW Contact  Su Hilt, RN Phone Number: 03/24/2020, 1:51 PM  Clinical Narrative:    [facility] medical back after getting a call from [employer] Keys requesting a call, I spoke with Thurmond Butts,  they have a pick up time scheduled to be 2 PM, the company stated that the family confirmed she will be going to a private residence  I let them know that the patient should be going to a rehab facility, they will confirm with the family       Expected Discharge Plan and Services                                                 Social Determinants of Health (SDOH) Interventions    Readmission Risk Interventions No flowsheet data found.

## 2020-03-25 ENCOUNTER — Inpatient Hospital Stay: Payer: Medicare Other

## 2020-03-25 LAB — CBC WITH DIFFERENTIAL/PLATELET
Abs Immature Granulocytes: 1.03 10*3/uL — ABNORMAL HIGH (ref 0.00–0.07)
Basophils Absolute: 0.1 10*3/uL (ref 0.0–0.1)
Basophils Relative: 1 %
Eosinophils Absolute: 0.8 10*3/uL — ABNORMAL HIGH (ref 0.0–0.5)
Eosinophils Relative: 3 %
HCT: 27.6 % — ABNORMAL LOW (ref 36.0–46.0)
Hemoglobin: 8.6 g/dL — ABNORMAL LOW (ref 12.0–15.0)
Immature Granulocytes: 4 %
Lymphocytes Relative: 12 %
Lymphs Abs: 3.4 10*3/uL (ref 0.7–4.0)
MCH: 31.4 pg (ref 26.0–34.0)
MCHC: 31.2 g/dL (ref 30.0–36.0)
MCV: 100.7 fL — ABNORMAL HIGH (ref 80.0–100.0)
Monocytes Absolute: 2.4 10*3/uL — ABNORMAL HIGH (ref 0.1–1.0)
Monocytes Relative: 9 %
Neutro Abs: 20 10*3/uL — ABNORMAL HIGH (ref 1.7–7.7)
Neutrophils Relative %: 71 %
Platelets: 375 10*3/uL (ref 150–400)
RBC: 2.74 MIL/uL — ABNORMAL LOW (ref 3.87–5.11)
RDW: 16.6 % — ABNORMAL HIGH (ref 11.5–15.5)
Smear Review: NORMAL
WBC: 27.8 10*3/uL — ABNORMAL HIGH (ref 4.0–10.5)
nRBC: 2.5 % — ABNORMAL HIGH (ref 0.0–0.2)

## 2020-03-25 LAB — URINALYSIS, COMPLETE (UACMP) WITH MICROSCOPIC
Bacteria, UA: NONE SEEN
Glucose, UA: NEGATIVE mg/dL
Ketones, ur: NEGATIVE mg/dL
Nitrite: NEGATIVE
Protein, ur: 30 mg/dL — AB
Specific Gravity, Urine: 1.015 (ref 1.005–1.030)
pH: 5.5 (ref 5.0–8.0)

## 2020-03-25 LAB — PATHOLOGIST SMEAR REVIEW

## 2020-03-25 LAB — CBC
HCT: 26.3 % — ABNORMAL LOW (ref 36.0–46.0)
Hemoglobin: 8.2 g/dL — ABNORMAL LOW (ref 12.0–15.0)
MCH: 31.4 pg (ref 26.0–34.0)
MCHC: 31.2 g/dL (ref 30.0–36.0)
MCV: 100.8 fL — ABNORMAL HIGH (ref 80.0–100.0)
Platelets: 340 10*3/uL (ref 150–400)
RBC: 2.61 MIL/uL — ABNORMAL LOW (ref 3.87–5.11)
RDW: 16.7 % — ABNORMAL HIGH (ref 11.5–15.5)
WBC: 27.5 10*3/uL — ABNORMAL HIGH (ref 4.0–10.5)
nRBC: 4.7 % — ABNORMAL HIGH (ref 0.0–0.2)

## 2020-03-25 LAB — PROCALCITONIN: Procalcitonin: 0.29 ng/mL

## 2020-03-25 LAB — BASIC METABOLIC PANEL WITH GFR
Anion gap: 8 (ref 5–15)
BUN: 55 mg/dL — ABNORMAL HIGH (ref 8–23)
CO2: 22 mmol/L (ref 22–32)
Calcium: 7.8 mg/dL — ABNORMAL LOW (ref 8.9–10.3)
Chloride: 109 mmol/L (ref 98–111)
Creatinine, Ser: 1.14 mg/dL — ABNORMAL HIGH (ref 0.44–1.00)
GFR calc Af Amer: 48 mL/min — ABNORMAL LOW
GFR calc non Af Amer: 41 mL/min — ABNORMAL LOW
Glucose, Bld: 106 mg/dL — ABNORMAL HIGH (ref 70–99)
Potassium: 4.2 mmol/L (ref 3.5–5.1)
Sodium: 139 mmol/L (ref 135–145)

## 2020-03-25 LAB — TYPE AND SCREEN
ABO/RH(D): A NEG
Antibody Screen: NEGATIVE

## 2020-03-25 LAB — SAVE SMEAR(SSMR), FOR PROVIDER SLIDE REVIEW

## 2020-03-25 LAB — MAGNESIUM: Magnesium: 2 mg/dL (ref 1.7–2.4)

## 2020-03-25 NOTE — Progress Notes (Signed)
Pt's R inner thigh and mons pubis are swollen and bruised possible from fall and/or surgery. Medical doctor was notified and assessed the pt with the RN and wants RN to reach out to orthopedic surgeon. Dr. Posey Pronto was notified.

## 2020-03-25 NOTE — Plan of Care (Signed)

## 2020-03-25 NOTE — Progress Notes (Signed)
°  Subjective: 3 Days Post-Op Procedure(s) (LRB): INTRAMEDULLARY (IM) NAIL INTERTROCHANTRIC (Right) Patient reports pain as mild.   Patient is well, and has had no acute complaints or problems Plan is to go Rehab in [place] after hospital stay. Negative for chest pain and shortness of breath Fever: no Gastrointestinal: Negative for nausea and vomiting  Objective: Vital signs in last 24 hours: Temp:  [97.7 F (36.5 C)-98 F (36.7 C)] 98 F (36.7 C) (06/30 2245) Pulse Rate:  [67-82] 82 (06/30 2245) Resp:  [16-18] 16 (06/30 2245) BP: (103-109)/(53-58) 103/53 (06/30 2245) SpO2:  [95 %-97 %] 95 % (06/30 2245)  Intake/Output from previous day:  Intake/Output Summary (Last 24 hours) at 03/25/2020 0710 Last data filed at 03/25/2020 8280 Gross per 24 hour  Intake 1879.96 ml  Output 750 ml  Net 1129.96 ml    Intake/Output this shift: No intake/output data recorded.  Labs: Recent Labs    03/23/20 0345 03/24/20 0345 03/25/20 0456  HGB 8.9* 8.1* 8.6*   Recent Labs    03/24/20 0345 03/25/20 0456  WBC 32.6* 27.8*  RBC 2.50* 2.74*  HCT 23.7* 27.6*  PLT 327 375   Recent Labs    03/24/20 0345 03/25/20 0456  NA 138 139  K 4.5 4.2  CL 108 109  CO2 23 22  BUN 58* 55*  CREATININE 1.27* 1.14*  GLUCOSE 119* 106*  CALCIUM 7.7* 7.8*   No results for input(s): LABPT, INR in the last 72 hours.   EXAM General - Patient is Alert and Oriented Extremity - Neurovascular intact Sensation intact distally Dorsiflexion/Plantar flexion intact Compartment soft Dressing/Incision -mild hemorrhagic drainage at the mid incision.  No change this morning.  It will be changed before discharge. Motor Function - intact, moving foot and toes well on exam.   Past Medical History:  Diagnosis Date   Hypertension    Thyroid disease     Assessment/Plan: 3 Days Post-Op Procedure(s) (LRB): INTRAMEDULLARY (IM) NAIL INTERTROCHANTRIC (Right) Principal Problem:   Intertrochanteric fracture of  right femur (HCC) Active Problems:   Hypertension   Thyroid disease   Atrial fibrillation with RVR (HCC)   Intertrochanteric fracture of right femur, closed, initial encounter ([place])   Chronic kidney disease, stage 4 (severe) (HCC)   Protein-calorie malnutrition, severe  Estimated body mass index is 22.19 kg/m as calculated from the following:   Height as of this encounter: 5' (1.524 m).   Weight as of this encounter: 51.5 kg. Advance diet Up with therapy D/C IV fluids  Follow-up at a an orthopedic department in [place] in 2 weeks for staple removal.  DVT Prophylaxis - Foot Pumps, TED hose and Eliquis Weight-Bearing as tolerated to right leg  Reche [name], PA-C Orthopaedic Surgery 03/25/2020, 7:10 AM

## 2020-03-25 NOTE — Progress Notes (Signed)
Physical Therapy Treatment Patient Details Name: Rebecca Cruz MRN: [id number] DOB: 04-01-1924 Today's Date: 03/25/2020    History of Present Illness Pt is a 86 y.o. female presenting to hospital 6/27 s/p fall (tripped and fell last night) sustaining R hip pain.  Imaging showing: "Displaced comminuted fracture through the right femoral intertrochanteric region. A nondisplaced fracture through the right inferior pubic ramus is not excluded."  Per MD Posey Pronto note (ortho): "We will additionally plan to treat possible right inferior pubic ramus fracture non-operatively as there are no other signs of significant pelvic ring injury."  Pt s/p IM nailing of R femur with cephalomedullary device 6/28 secondary R intertrochanteric hip fx.  PMH includes htn, thyroid disease, aortic valve repair, appendectomy, abdominal hysterectomy, stage IV CKD, a-fib.    PT Comments    Pt sleeping in bed upon PT arrival; woken with vc's.  Able to perform ex's in bed with assist for R LE but pt either falling asleep towards end of each exercise (x10 reps each exercise) or immediately after exercise completion (pt required to be woken via vc's each time to either finish exercise or to start new exercise).  Deferred OOB mobility d/t pt's drowsiness and difficulty maintaining alertness for activity (nurse notified of pt's status).  Will continue to focus on strengthening and progressive functional mobility per pt tolerance.    Follow Up Recommendations  SNF     Equipment Recommendations  Rolling walker with 5" wheels;3in1 (PT) (youth sized)    Recommendations for Other Services OT consult     Precautions / Restrictions Precautions Precautions: Fall Restrictions Weight Bearing Restrictions: Yes RLE Weight Bearing: Weight bearing as tolerated Other Position/Activity Restrictions: WBAT LE    Mobility  Bed Mobility                  Transfers                    Ambulation/Gait                  Stairs             Wheelchair Mobility    Modified Rankin (Stroke Patients Only)       Balance                                            Cognition Arousal/Alertness:  (Drowsy (question if due to pain medication)--discussed with pt's nurse) Behavior During Therapy: WFL for tasks assessed/performed Overall Cognitive Status: Within Functional Limits for tasks assessed                                        Exercises      General Comments   Nursing cleared pt for participation in physical therapy.  Pt agreeable to PT session.      Pertinent Vitals/Pain Pain Assessment: Faces Faces Pain Scale: Hurts a little bit Pain Location: R hip/thigh Pain Descriptors / Indicators: Aching;Sore;Tender Pain Intervention(s): Limited activity within patient's tolerance;Monitored during session;Repositioned;Premedicated before session;Ice applied  Vitals (HR and O2 on room air) stable and WFL throughout treatment session.    Home Living                      Prior Function  PT Goals (current goals can now be found in the care plan section) Acute Rehab PT Goals Patient Stated Goal: To be able to move better PT Goal Formulation: With patient Time For Goal Achievement: 04/06/20 Potential to Achieve Goals: Good Progress towards PT goals: Progressing toward goals    Frequency    BID      PT Plan Current plan remains appropriate    Co-evaluation              AM-PAC PT "6 Clicks" Mobility   Outcome Measure  Help needed turning from your back to your side while in a flat bed without using bedrails?: A Little Help needed moving from lying on your back to sitting on the side of a flat bed without using bedrails?: A Lot Help needed moving to and from a bed to a chair (including a wheelchair)?: A Lot Help needed standing up from a chair using your arms (e.g., wheelchair or bedside chair)?: A Lot Help needed to  walk in hospital room?: A Lot Help needed climbing 3-5 steps with a railing? : Total 6 Click Score: 12    End of Session   Activity Tolerance: Other (comment) (limited d/t drowsiness) Patient left: in bed;with call bell/phone within reach;with bed alarm set;with SCD's reapplied;Other (comment) (B heels floating via pillow) Nurse Communication: Mobility status;Precautions;Other (comment) (pt's drowsiness) PT Visit Diagnosis: Other abnormalities of gait and mobility (R26.89);Muscle weakness (generalized) (M62.81);History of falling (Z91.81);Difficulty in walking, not elsewhere classified (R26.2);Pain Pain - Right/Left: Right Pain - part of body: Hip     Time: 4315-4008 PT Time Calculation (min) (ACUTE ONLY): 13 min  Charges:  $Therapeutic Exercise: 8-22 mins                    Leitha Bleak, PT 03/25/20, 12:45 PM

## 2020-03-25 NOTE — Progress Notes (Signed)
Physical Therapy Treatment Patient Details Name: Rebecca Cruz MRN: [id number] DOB: [birth year] Today's Date: 03/25/2020    History of Present Illness Pt is a 86 y.o. female presenting to hospital 6/27 s/p fall (tripped and fell last night) sustaining R hip pain.  Imaging showing: "Displaced comminuted fracture through the right femoral intertrochanteric region. A nondisplaced fracture through the right inferior pubic ramus is not excluded."  Per MD Posey Pronto note (ortho): "We will additionally plan to treat possible right inferior pubic ramus fracture non-operatively as there are no other signs of significant pelvic ring injury."  Pt s/p IM nailing of R femur with cephalomedullary device 6/28 secondary R intertrochanteric hip fx.  PMH includes htn, thyroid disease, aortic valve repair, appendectomy, abdominal hysterectomy, stage IV CKD, a-fib.    PT Comments    Pt resting in bed and talking with her visitor (family member) upon PT entry.  Pt agreeable to sitting on edge of bed and performing LE ex's (pt declining further mobility d/t R hip/thigh pain; pt received pain medication prior to PT session).  2 assist provided for bed mobility to improve comfort with transitions.  Min to mod assist for sitting balance x5 minutes (pt leaning to L and posterior in sitting).  Tolerated LE ex's fairly well with assist for R LE (in semi-supine in bed and sitting edge of bed).  After laying pt back down in bed, incontinence noted on linen (nurse reports pt given suppository and used BSC with staff assist earlier) so pt assisted with logrolling in bed to change linen and for clean-up.  During logrolling in bed, bruising noted to pt's posterior R knee (extending from bruising from inner R thigh that was noted this morning) and overall bruising appearing to be getting worse compared to this morning: nurse notified immediately and came to assess.  Nurse notified MD who came to assess pt.  Will monitor pt's status and  continue to focus on strengthening and progressive functional mobility per pt tolerance/as appropriate.   Follow Up Recommendations  SNF     Equipment Recommendations  Rolling walker with 5" wheels;3in1 (PT)    Recommendations for Other Services OT consult     Precautions / Restrictions Precautions Precautions: Fall Restrictions Weight Bearing Restrictions: Yes Other Position/Activity Restrictions: WBAT LE    Mobility  Bed Mobility Overal bed mobility: Needs Assistance Bed Mobility: Supine to Sit;Sit to Supine     Supine to sit: +2 for physical assistance;HOB elevated Sit to supine: +2 for physical assistance;HOB elevated   General bed mobility comments: d/t pt's R hip/thigh pain, utilized bed linen to assist with bed mobility (1 assist for trunk and 1 assist for LE's for comfort)  Transfers                 General transfer comment: Deferred d/t requiring assist for sitting balance  Ambulation/Gait                 Stairs             Wheelchair Mobility    Modified Rankin (Stroke Patients Only)       Balance Overall balance assessment: Needs assistance Sitting-balance support: Bilateral upper extremity supported;Feet supported Sitting balance-Leahy Scale: Poor Sitting balance - Comments: posterior L lean in sitting requiring min to mod assist for sitting balance  Cognition Arousal/Alertness:  (Drowsy at times (other times alert)) Behavior During Therapy: WFL for tasks assessed/performed Overall Cognitive Status: Within Functional Limits for tasks assessed                                        Exercises Total Joint Exercises Ankle Circles/Pumps: AROM;Strengthening;Both;10 reps;Supine Short Arc Quad: AAROM;Strengthening;Right;10 reps;Supine Heel Slides: AAROM;Strengthening;Right;10 reps;Supine Hip ABduction/ADduction: AAROM;Strengthening;Right;10 reps;Supine Long Arc  Quad: AROM;Left;AAROM;Right;Strengthening;10 reps;Seated General Exercises - Lower Extremity Hip Flexion/Marching: AROM;Left;AAROM;Right;Strengthening;10 reps;Seated (minimal range R hip for comfort)    General Comments General comments (skin integrity, edema, etc.): bruising noted (nurse notified).  Nursing cleared pt for participation in physical therapy.  Pt agreeable to PT session.      Pertinent Vitals/Pain Pain Assessment: Faces Faces Pain Scale: Hurts a little bit (2/10 at rest; 6/10 with activity) Pain Location: R hip/thigh Pain Descriptors / Indicators: Aching;Sore;Tender Pain Intervention(s): Limited activity within patient's tolerance;Monitored during session;Repositioned;Premedicated before session  Vitals (HR and O2 on room air) stable and WFL throughout treatment session.    Home Living                      Prior Function            PT Goals (current goals can now be found in the care plan section) Acute Rehab PT Goals Patient Stated Goal: To be able to move better PT Goal Formulation: With patient Time For Goal Achievement: 04/06/20 Potential to Achieve Goals: Fair Progress towards PT goals: Progressing toward goals    Frequency    BID      PT Plan Current plan remains appropriate    Co-evaluation              AM-PAC PT "6 Clicks" Mobility   Outcome Measure  Help needed turning from your back to your side while in a flat bed without using bedrails?: A Little Help needed moving from lying on your back to sitting on the side of a flat bed without using bedrails?: A Lot Help needed moving to and from a bed to a chair (including a wheelchair)?: Total Help needed standing up from a chair using your arms (e.g., wheelchair or bedside chair)?: Total Help needed to walk in hospital room?: Total Help needed climbing 3-5 steps with a railing? : Total 6 Click Score: 9    End of Session   Activity Tolerance: Patient limited by pain Patient  left: in bed;with call bell/phone within reach;with bed alarm set;with family/visitor present;with SCD's reapplied;Other (comment) (R LE elevated on 2 pillows and L LE elevated on 1 pillow with B heels floating) Nurse Communication: Mobility status;Precautions (increased bruising noted R LE) PT Visit Diagnosis: Other abnormalities of gait and mobility (R26.89);Muscle weakness (generalized) (M62.81);History of falling (Z91.81);Difficulty in walking, not elsewhere classified (R26.2);Pain Pain - Right/Left: Right Pain - part of body: Hip     Time: 5621-3086 PT Time Calculation (min) (ACUTE ONLY): 40 min  Charges:  $Therapeutic Exercise: 23-37 mins $Therapeutic Activity: 8-22 mins                     Leitha Bleak, PT 03/25/20, 4:31 PM

## 2020-03-25 NOTE — Progress Notes (Signed)
PT worked with pt. RN was notified that bruising of R thigh was getting worse. RN reassess the site. Bruising was noted in pt's inner thigh to her lower leg, bruising in the mons pubic was noted too. Dr. Algis Liming was personally notified and assessed the pt with RN, ordered for CBC and Type and Screen. Dr. Posey Pronto was notified too.

## 2020-03-25 NOTE — Progress Notes (Signed)
PROGRESS NOTE   Rebecca Cruz  OEV:[id number]    DOB: 12-06-1923    DOA: 03/21/2020  PCP: Patient, No Pcp Per   I have briefly reviewed patients previous medical records in [facility].  Chief Complaint  Patient presents with  . Fall    Brief Narrative:  86 year old female with PMH of hypertension, hypothyroidism, stage IV chronic kidney disease, atrial fibrillation on chronic anticoagulation, originally from [place], visiting family in the [place] area, presented to ED via EMS for evaluation of right hip pain.  S/p IM intertrochanteric nail on 6/28 for intertrochanteric fracture of right femur.  Progressing slowly.  AKI improving.  Plans for DC to SNF in [place] when stable, likely 7/3.   Assessment & Plan:  Principal Problem:   Intertrochanteric fracture of right femur [facility]) Active Problems:   Hypertension   Thyroid disease   Atrial fibrillation with RVR (HCC)   Intertrochanteric fracture of right femur, closed, initial encounter ([place])   Chronic kidney disease, stage 4 (severe) (HCC)   Protein-calorie malnutrition, severe   Intertrochanteric fracture of right femur, s/p IM nail 6/28.  Sustained status post fall and had a displaced comminuted fracture.  Orthopedics follow-up appreciated.  Pain appears to be reasonably controlled.  Progressing slowly with physical therapy this morning.  WBAT to right lower extremity.  Although she may follow-up with [facility] clinic orthopedics in 2 weeks for staple removal, since she is going to [facility] and [place], will need to set up follow-up with orthopedics MD locally.  On 7/1, noted increasing swelling of her right thigh with bruising on the medial aspect of the right thigh extending from the groin/mons pubis up to the popliteal fossa. No pain. Had orthopedics review her. I discussed with Dr. Posey Pronto. He indicated that he had to do some manipulation on the medial aspect of the extremity during surgery which may have precipitated some  bleeding. He is not concerned about overt bleeding, leg is soft and recommends close monitoring. Agrees with holding Eliquis temporarily. If swelling or bruising worsens or worsening pain then may consider CT of the extremity.  AKI complicating stage IV chronic kidney disease: Not present on admission.  Creatinine peaked to 1.42 on 6/29. Improved. Improving. Now getting volume overloaded. Discontinued IV fluids.  Avoid nephrotoxic.  History of atrial fibrillation: Continue metoprolol. Eliquis held after this morning's dose due to concern for bruising/bleeding of right thigh.  Essential hypertension: Controlled on Toprol-XL.  Losartan held due to AKI.  Hypothyroid: Continue Synthroid.  Postop acute blood loss anemia: Hemoglobin down from 11.2-8.1 but stable compared to yesterday.  Follow CBC in a.m. and transfuse if hemoglobin 7 g or less. Hemoglobin fairly stable, has dropped from 8.6-8.2 from this morning to this afternoon. Follow CBC in a.m.  Leukocytosis: WBC 18 on presentation which is progressively increased, 22.3 > 26.7 > 32.6.?  Stress response.  No overt features of infectious cause.  As per family's report to prior MD, has history of elevated WBC.  Holding antibiotics.  However review in care everywhere shows WBC in the 11-12 range in 2019 and 2020.  Follow CBC in a.m. and if persistently elevated, consider hematology consultation.  Also check peripheral smear. WBC count down to 27.  Acute urinary retention: After 2 attempts of in and out catheterization last night, eventually had Foley catheter placed. Consider discontinuing Foley catheter tomorrow and voiding trials.  Body mass index is 22.19 kg/m.  Nutritional Status Nutrition Problem: Severe Malnutrition Etiology: chronic illness (CKD) Signs/Symptoms: severe fat depletion,  severe muscle depletion Interventions: Refer to RD note for recommendations  DVT prophylaxis: Eliquis Code Status: DNR Family Communication: I discussed in  detail with patient's niece at bedside and then with patient's daughter via family's phone. Daughter is out of state. Updated care and answered questions. Disposition:  Status is: Inpatient  Remains inpatient appropriate because:Inpatient level of care appropriate due to severity of illness   Dispo: The patient is from: Home              Anticipated d/c is to: SNF              Anticipated d/c date is: 3 days.  Likely 7/3 to an out-of-state SNF in [place].              Patient currently is not medically stable to d/c.        Consultants:   Orthopedic  Procedures:   As above  Antimicrobials:   None   Subjective:  Patient interviewed and examined along with a female and a female nurse chaperone in the room. Does not complain of more pain in the operated extremity. Now has Foley catheter due to urinary retention. Passing flatus. No BM this morning but subsequently had 1 with a suppository. Nurses directed me to bruising of right medial thigh.  Objective:   Vitals:   03/24/20 2245 03/25/20 0758 03/25/20 0917 03/25/20 1547  BP: (!) 103/53 (!) 97/43 106/81 (!) 108/57  Pulse: 82 72 92 89  Resp: 16 18  18   Temp: 98 F (36.7 C) 97.9 F (36.6 C)  98.4 F (36.9 C)  TempSrc:  Oral  Oral  SpO2: 95% 92%  97%  Weight:      Height:        General exam: Elderly female, moderately built and frail, lying comfortably propped up in bed without distress. Respiratory system: Clear to auscultation. Respiratory effort normal. Cardiovascular system: S1 & S2 heard, irregularly irregular. No JVD, murmurs, rubs, gallops or clicks. No pedal edema.  Gastrointestinal system: Abdomen mildly distended but not tense or tender. No organomegaly or masses appreciated. Hyperactive bowel sounds. Central nervous system: Alert and oriented. No focal neurological deficits. Extremities: Symmetric 5 x 5 power.  Right hip surgical site dressing with minimal dried blood. Noted some swelling over the medial aspect  of the right thigh with a band of bruising extending from the right groin to the right popliteal fossa, this seemed a little worse when I reexamined her later this afternoon with involvement of right aspect of the mons pubis. Skin: No rashes, lesions or ulcers Psychiatry: Judgement and insight appear normal. Mood & affect appropriate.     Data Reviewed:   I have personally reviewed following labs and imaging studies   CBC: Recent Labs  Lab 03/24/20 0345 03/25/20 0456 03/25/20 1501  WBC 32.6* 27.8* 27.5*  NEUTROABS  --  20.0*  --   HGB 8.1* 8.6* 8.2*  HCT 23.7* 27.6* 26.3*  MCV 94.8 100.7* 100.8*  PLT 327 375 417    Basic Metabolic Panel: Recent Labs  Lab 03/23/20 0345 03/24/20 0345 03/25/20 0456  NA 137 138 139  K 4.6 4.5 4.2  CL 105 108 109  CO2 25 23 22   GLUCOSE 174* 119* 106*  BUN 51* 58* 55*  CREATININE 1.42* 1.27* 1.14*  CALCIUM 8.0* 7.7* 7.8*  MG 1.8 2.0 2.0    Liver Function Tests: No results for input(s): AST, ALT, ALKPHOS, BILITOT, PROT, ALBUMIN in the last 168 hours.  CBG:  No results for input(s): GLUCAP in the last 168 hours.  Microbiology Studies:   Recent Results (from the past 240 hour(s))  SARS Coronavirus 2 by RT PCR (hospital order, performed in [facility] hospital lab) Nasopharyngeal Nasopharyngeal Swab     Status: None   Collection Time: 03/21/20  8:06 AM   Specimen: Nasopharyngeal Swab  Result Value Ref Range Status   SARS Coronavirus 2 NEGATIVE NEGATIVE Final    Comment: (NOTE) SARS-CoV-2 target nucleic acids are NOT DETECTED.  The SARS-CoV-2 RNA is generally detectable in upper and lower respiratory specimens during the acute phase of infection. The lowest concentration of SARS-CoV-2 viral copies this assay can detect is 250 copies / mL. A negative result does not preclude SARS-CoV-2 infection and should not be used as the sole basis for treatment or other patient management decisions.  A negative result may occur with improper  specimen collection / handling, submission of specimen other than nasopharyngeal swab, presence of viral mutation(s) within the areas targeted by this assay, and inadequate number of viral copies (<250 copies / mL). A negative result must be combined with clinical observations, patient history, and epidemiological information.  Fact Sheet for Patients:   StrictlyIdeas.no  Fact Sheet for Healthcare Providers: BankingDealers.co.za  This test is not yet approved or  cleared by the [place] FDA and has been authorized for detection and/or diagnosis of SARS-CoV-2 by FDA under an Emergency Use Authorization (EUA).  This EUA will remain in effect (meaning this test can be used) for the duration of the COVID-19 declaration under Section 564(b)(1) of the Act, 21 U.S.C. section 360bbb-3(b)(1), unless the authorization is terminated or revoked sooner.  Performed at [facility], [address] [place], [place] [address]   Surgical PCR screen     Status: None   Collection Time: 03/21/20 12:15 PM   Specimen: Nasal Mucosa; Nasal Swab  Result Value Ref Range Status   MRSA, PCR NEGATIVE NEGATIVE Final   Staphylococcus aureus NEGATIVE NEGATIVE Final    Comment: (NOTE) The Xpert SA Assay (FDA approved for NASAL specimens in patients 36 years of age and older), is one component of a comprehensive surveillance program. It is not intended to diagnose infection nor to guide or monitor treatment. Performed at [facility], [address], [place], [place] [address]      Radiology Studies:  DG Abd Portable 1V  Result Date: 03/25/2020 CLINICAL DATA:  Abdominal pain and distension EXAM: PORTABLE ABDOMEN - 1 VIEW COMPARISON:  None. FINDINGS: There is moderate stool throughout the colon. There is no bowel dilatation or air-fluid level to suggest bowel obstruction. No free air. There are scattered foci of arterial vascular  calcification. There is postoperative change in the proximal right femur. Bones are osteoporotic. IMPRESSION: Moderate stool in colon.  No bowel obstruction or free air evident. Electronically Signed   By: Lowella Grip III M.D.   On: 03/25/2020 10:10     Scheduled Meds:   . apixaban  2.5 mg Oral BID  . calcium acetate  667 mg Oral BID WC  . Chlorhexidine Gluconate Cloth  6 each Topical Daily  . docusate sodium  100 mg Oral BID  . feeding supplement (ENSURE ENLIVE)  237 mL Oral BID BM  . levothyroxine  75 mcg Oral Q0600  . loratadine  10 mg Oral Daily  . metoprolol succinate  50 mg Oral Daily  . polyethylene glycol  17 g Oral Daily    Continuous Infusions:   .  methocarbamol (ROBAXIN) IV       LOS: 4 days     Vernell Leep, MD, [place], [facility]. [facility]    To contact the attending provider between 7A-7P or the covering provider during after hours 7P-7A, please log into the web site [email or web address] and access using universal [place] password for that web site. If you do not have the password, please call the hospital operator.  03/25/2020, 6:25 PM

## 2020-03-26 ENCOUNTER — Encounter: Payer: Self-pay | Admitting: Internal Medicine

## 2020-03-26 DIAGNOSIS — S72141A Displaced intertrochanteric fracture of right femur, initial encounter for closed fracture: Principal | ICD-10-CM

## 2020-03-26 DIAGNOSIS — D72829 Elevated white blood cell count, unspecified: Secondary | ICD-10-CM

## 2020-03-26 LAB — BASIC METABOLIC PANEL
Anion gap: 9 (ref 5–15)
BUN: 63 mg/dL — ABNORMAL HIGH (ref 8–23)
CO2: 20 mmol/L — ABNORMAL LOW (ref 22–32)
Calcium: 7.9 mg/dL — ABNORMAL LOW (ref 8.9–10.3)
Chloride: 108 mmol/L (ref 98–111)
Creatinine, Ser: 1.41 mg/dL — ABNORMAL HIGH (ref 0.44–1.00)
GFR calc Af Amer: 37 mL/min — ABNORMAL LOW (ref >60)
GFR calc non Af Amer: 32 mL/min — ABNORMAL LOW (ref >60)
Glucose, Bld: 139 mg/dL — ABNORMAL HIGH (ref 70–99)
Potassium: 4.8 mmol/L (ref 3.5–5.1)
Sodium: 137 mmol/L (ref 135–145)

## 2020-03-26 LAB — CBC
HCT: 26.1 % — ABNORMAL LOW (ref 36.0–46.0)
Hemoglobin: 8.2 g/dL — ABNORMAL LOW (ref 12.0–15.0)
MCH: 31.4 pg (ref 26.0–34.0)
MCHC: 31.4 g/dL (ref 30.0–36.0)
MCV: 100 fL (ref 80.0–100.0)
Platelets: 326 10*3/uL (ref 150–400)
RBC: 2.61 MIL/uL — ABNORMAL LOW (ref 3.87–5.11)
RDW: 16.7 % — ABNORMAL HIGH (ref 11.5–15.5)
WBC: 26.4 10*3/uL — ABNORMAL HIGH (ref 4.0–10.5)
nRBC: 5 % — ABNORMAL HIGH (ref 0.0–0.2)

## 2020-03-26 MED ORDER — POLYETHYLENE GLYCOL 3350 17 G PO PACK
17.0000 g | PACK | Freq: Two times a day (BID) | ORAL | Status: DC
  Administered 2020-03-26 – 2020-04-02 (×9): 17 g via ORAL
  Filled 2020-03-26 (×14): qty 1

## 2020-03-26 MED ORDER — BISACODYL 10 MG RE SUPP: 10.0000 mg | Freq: Once | RECTAL | Status: DC

## 2020-03-26 MED ORDER — ACETAMINOPHEN 325 MG PO TABS
650.0000 mg | ORAL_TABLET | Freq: Four times a day (QID) | ORAL | Status: DC | PRN
  Administered 2020-03-26 – 2020-03-27 (×2): 650 mg via ORAL
  Filled 2020-03-26 (×2): qty 2

## 2020-03-26 MED ORDER — TRAMADOL HCL 50 MG PO TABS: 50.0000 mg | ORAL_TABLET | Freq: Two times a day (BID) | ORAL | Status: DC | PRN

## 2020-03-26 NOTE — Care Management Important Message (Signed)
Important Message  Patient Details  Name: Bernedette Auston MRN: [id number] Date of Birth: 09-19-1924   Medicare Important Message Given:  Yes     [name] A Miyako Oelke 03/26/2020, 11:09 AM

## 2020-03-26 NOTE — Progress Notes (Signed)
Checked patient's hip dressing again just now and it is completely saturated with serosanguinous fluid. Pt is stable with no new complaints. Paged Dr. Roland Rack who was on call and called me right back and we went over her hx, medications and platelet count. Orders received and administered. Dressing changed witht he help of my CNA Megan. Patient tolerated well.

## 2020-03-26 NOTE — Progress Notes (Signed)
PROGRESS NOTE   Rebecca Cruz  ENI:[id number]    DOB: 23-Nov-1923    DOA: 03/21/2020  PCP: Patient, No Pcp Per   I have briefly reviewed patients previous medical records in [facility].  Chief Complaint  Patient presents with  . Fall    Brief Narrative:  86 year old female with PMH of hypertension, hypothyroidism, stage IV chronic kidney disease, atrial fibrillation on chronic anticoagulation, originally from [place], visiting family in the [place] area, presented to ED via EMS for evaluation of right hip pain.  S/p IM intertrochanteric nail on 6/28 for intertrochanteric fracture of right femur.  Hospital course complicated by ecchymosis of right medial thigh possibly related to surgical manipulation, some acute blood loss anemia and overall slow progress.  Hematology consulted for leukocytosis.   Assessment & Plan:  Principal Problem:   Intertrochanteric fracture of right femur [facility]) Active Problems:   Hypertension   Thyroid disease   Atrial fibrillation with RVR (HCC)   Intertrochanteric fracture of right femur, closed, initial encounter ([place])   Chronic kidney disease, stage 4 (severe) (HCC)   Protein-calorie malnutrition, severe   Leukocytosis   Intertrochanteric fracture of right femur, s/p IM nail 6/28.  Sustained status post fall and had a displaced comminuted fracture.  Orthopedics following. WBAT to right lower extremity.  Although she may follow-up with [facility] clinic orthopedics in 2 weeks for staple removal, since she is going to [facility] and [place], will need to set up follow-up with orthopedics MD locally.  On 7/1, noted increasing swelling of her right thigh with bruising on the medial aspect of the right thigh extending from the groin/mons pubis up to the popliteal fossa. No pain. Had orthopedics review her. I discussed with Dr. Posey Pronto. He indicated that he had to do some manipulation on the medial aspect of the extremity during surgery which may have  precipitated some bleeding. He is not concerned about overt bleeding, leg is soft and recommends close monitoring. Agrees with holding Eliquis temporarily. If swelling or bruising worsens or worsening pain then may consider CT of the extremity.  On 7/2, no significant increase in bruising of the left medial thigh, seems to have tracked down to the left ankle.  Hemoglobin relatively stable.  No extremity pain except for appropriate postop right hip pain.  Minimize opioids due to some mental status changes and ongoing constipation.  AKI complicating stage IV chronic kidney disease: Not present on admission.  Creatinine peaked to 1.42 on 6/29.  Creatinine had improved to 1.14 but back up to 1.4 at this time.  Avoiding IV fluids due to edema of lower extremities especially right from surgery and prior volume resuscitation.  Increase oral intake.  Follow BMP in a.m.  No concern for urinary retention due to indwelling Foley catheter.  History of atrial fibrillation: Continue metoprolol. Eliquis held after 7/1 morning's dose due to concern for bruising/bleeding of right thigh.  I discussed with the orthopedic team and they recommend continuing to hold for today and then consider restarting tomorrow.  Essential hypertension: Controlled on Toprol-XL.  Losartan held due to AKI.  Hypothyroid: Continue Synthroid.  Postop acute blood loss anemia: Hemoglobin down from 11.2-8.1 but stable compared to yesterday.  Follow CBC in a.m. and transfuse if hemoglobin 7 g or less.  Hemoglobin stable compared to yesterday/8.2.  Follow CBC in a.m.  Leukocytosis: WBC 18 on presentation which is progressively increased, 22.3 > 26.7 > 32.6.?  Stress response.  No overt features of infectious cause.  As per  family's report to prior MD, has history of elevated WBC.  Holding antibiotics.  However review in care everywhere shows WBC in the 11-12 range in 2019 and 2020.  Follow CBC in a.m. and if persistently elevated, consider  hematology consultation.  Also check peripheral smear. WBC count down to 27.  Appreciate hematology consultation and they do suspect that it is likely reactive.  Checking peripheral smear with flow cytometry but no further interventions at this time.  Follow CBC in a.m.  Acute urinary retention: After 2 attempts of in and out catheterization last night, eventually had Foley catheter placed. Consider discontinuing Foley catheter tomorrow and voiding trials.  Body mass index is 22.19 kg/m.  Nutritional Status Nutrition Problem: Severe Malnutrition Etiology: chronic illness (CKD) Signs/Symptoms: severe fat depletion, severe muscle depletion Interventions: Refer to RD note for recommendations  DVT prophylaxis: Eliquis Code Status: DNR Family Communication: I discussed in detail with patient's son via phone, updated care and answered all questions.  Advised him that it is unlikely that patient will be ready for discharge by tomorrow given her slow progress and some setbacks.  He verbalized understanding. Disposition:  Status is: Inpatient  Remains inpatient appropriate because:Inpatient level of care appropriate due to severity of illness   Dispo: The patient is from: Home              Anticipated d/c is to: SNF              Anticipated d/c date is: 3 days.  Likely early next week              Patient currently is not medically stable to d/c.        Consultants:   Orthopedic  Procedures:   As above  Antimicrobials:   None   Subjective:  This morning RN requested that I see the patient soon and evaluated patient at bedside.  Reportedly had complained of some tingling of right upper extremity.  However upon our bedside assessment, patient only complained of appropriate postop right hip pain and overall feeling miserable but denied any strokelike symptoms.  Denied any medial right thigh pain.  Some abdominal discomfort.  Objective:   Vitals:   03/25/20 1547 03/26/20 0027  03/26/20 0820 03/26/20 1639  BP: (!) 108/57 116/64 123/60 116/66  Pulse: 89 91 76 78  Resp: 18 18 18 18   Temp: 98.4 F (36.9 C) 97.8 F (36.6 C) 97.7 F (36.5 C) (!) 97.4 F (36.3 C)  TempSrc: Oral Oral Oral Oral  SpO2: 97% 91% 97% 94%  Weight:      Height:        General exam: Elderly female, moderately built and frail, lying uncomfortably propped up in bed but without obvious distress. Respiratory system: Clear to auscultation. Respiratory effort normal. Cardiovascular system: S1 & S2 heard, irregularly irregular. No JVD, murmurs, rubs, gallops or clicks.  Right lower extremity edema, upper thigh edema likely related to surgical intervention.  Trace left ankle edema.  Not on telemetry. Gastrointestinal system: Abdomen mildly distended but not tense or tender. No organomegaly or masses appreciated.  Normal bowel sounds heard.. Central nervous system: Alert and oriented x2. No focal neurological deficits. Extremities: Symmetric 5 x 5 power.  Right hip surgical site dressing with minimal dried blood. Noted some swelling over the medial aspect of the right thigh with a band of bruising extending from the right groin to the right popliteal fossa, has faint tracking of this bruising down to right ankle but does  not seem to be any worse than yesterday Skin: No rashes, lesions or ulcers Psychiatry: Judgement and insight appear overall impaired. Mood & affect appropriate.     Data Reviewed:   I have personally reviewed following labs and imaging studies   CBC: Recent Labs  Lab 03/25/20 0456 03/25/20 1501 03/26/20 0632  WBC 27.8* 27.5* 26.4*  NEUTROABS 20.0*  --   --   HGB 8.6* 8.2* 8.2*  HCT 27.6* 26.3* 26.1*  MCV 100.7* 100.8* 100.0  PLT 375 340 865    Basic Metabolic Panel: Recent Labs  Lab 03/23/20 0345 03/23/20 0345 03/24/20 0345 03/25/20 0456 03/26/20 0632  NA 137   < > 138 139 137  K 4.6   < > 4.5 4.2 4.8  CL 105   < > 108 109 108  CO2 25   < > 23 22 20*    GLUCOSE 174*   < > 119* 106* 139*  BUN 51*   < > 58* 55* 63*  CREATININE 1.42*   < > 1.27* 1.14* 1.41*  CALCIUM 8.0*   < > 7.7* 7.8* 7.9*  MG 1.8  --  2.0 2.0  --    < > = values in this interval not displayed.    Liver Function Tests: No results for input(s): AST, ALT, ALKPHOS, BILITOT, PROT, ALBUMIN in the last 168 hours.  CBG: No results for input(s): GLUCAP in the last 168 hours.  Microbiology Studies:   Recent Results (from the past 240 hour(s))  SARS Coronavirus 2 by RT PCR (hospital order, performed in [facility] hospital lab) Nasopharyngeal Nasopharyngeal Swab     Status: None   Collection Time: 03/21/20  8:06 AM   Specimen: Nasopharyngeal Swab  Result Value Ref Range Status   SARS Coronavirus 2 NEGATIVE NEGATIVE Final    Comment: (NOTE) SARS-CoV-2 target nucleic acids are NOT DETECTED.  The SARS-CoV-2 RNA is generally detectable in upper and lower respiratory specimens during the acute phase of infection. The lowest concentration of SARS-CoV-2 viral copies this assay can detect is 250 copies / mL. A negative result does not preclude SARS-CoV-2 infection and should not be used as the sole basis for treatment or other patient management decisions.  A negative result may occur with improper specimen collection / handling, submission of specimen other than nasopharyngeal swab, presence of viral mutation(s) within the areas targeted by this assay, and inadequate number of viral copies (<250 copies / mL). A negative result must be combined with clinical observations, patient history, and epidemiological information.  Fact Sheet for Patients:   StrictlyIdeas.no  Fact Sheet for Healthcare Providers: BankingDealers.co.za  This test is not yet approved or  cleared by the [place] FDA and has been authorized for detection and/or diagnosis of SARS-CoV-2 by FDA under an Emergency Use Authorization (EUA).  This EUA will  remain in effect (meaning this test can be used) for the duration of the COVID-19 declaration under Section 564(b)(1) of the Act, 21 U.S.C. section 360bbb-3(b)(1), unless the authorization is terminated or revoked sooner.  Performed at [facility], [address], [place], [place] [address]   Surgical PCR screen     Status: None   Collection Time: 03/21/20 12:15 PM   Specimen: Nasal Mucosa; Nasal Swab  Result Value Ref Range Status   MRSA, PCR NEGATIVE NEGATIVE Final   Staphylococcus aureus NEGATIVE NEGATIVE Final    Comment: (NOTE) The Xpert SA Assay (FDA approved for NASAL specimens in patients 84 years of age and  older), is one component of a comprehensive surveillance program. It is not intended to diagnose infection nor to guide or monitor treatment. Performed at [facility], [address] [place], [place] [address]      Radiology Studies:  DG Abd Portable 1V  Result Date: 03/25/2020 CLINICAL DATA:  Abdominal pain and distension EXAM: PORTABLE ABDOMEN - 1 VIEW COMPARISON:  None. FINDINGS: There is moderate stool throughout the colon. There is no bowel dilatation or air-fluid level to suggest bowel obstruction. No free air. There are scattered foci of arterial vascular calcification. There is postoperative change in the proximal right femur. Bones are osteoporotic. IMPRESSION: Moderate stool in colon.  No bowel obstruction or free air evident. Electronically Signed   By: Lowella Grip III M.D.   On: 03/25/2020 10:10     Scheduled Meds:   . calcium acetate  667 mg Oral BID WC  . Chlorhexidine Gluconate Cloth  6 each Topical Daily  . docusate sodium  100 mg Oral BID  . feeding supplement (ENSURE ENLIVE)  237 mL Oral BID BM  . levothyroxine  75 mcg Oral Q0600  . loratadine  10 mg Oral Daily  . metoprolol succinate  50 mg Oral Daily  . polyethylene glycol  17 g Oral Daily    Continuous Infusions:   . methocarbamol (ROBAXIN) IV       LOS: 5  days     Vernell Leep, MD, [place], [facility]. [facility]    To contact the attending provider between 7A-7P or the covering provider during after hours 7P-7A, please log into the web site [email or web address] and access using universal [place] password for that web site. If you do not have the password, please call the hospital operator.  03/26/2020, 6:20 PM

## 2020-03-26 NOTE — TOC Progression Note (Signed)
Transition of Care [facility]) - Progression Note    Patient Details  Name: Rebecca Cruz MRN: [id number] Date of Birth: 1924-07-08  Transition of Care [facility]) CM/SW Contact  Su Hilt, RN Phone Number: 03/26/2020, 10:37 AM  Clinical Narrative:     Spoke with the physician and he notified me that the patient will most likely not be medically stable to DC tomorrow with medical transport to [place] to Rehab, The patient is to go to [place] is the contact person at [facility] [phone] The patient has had Covid Vaccines, I called Rebecca Cruz, the patient's son and explained that she most likely will not be medically stable tomorrow to discharge to rehab, he stated that would mean that he would lose the $5400 that they had to pay for medical transport, I asked if he could postpone the medical transport until next week, Mon.  He said he could but that would mean they would lose the money, He commented that they could go Against medical Advise.  I explained that he would need to speak with the physician, I explained that the rehab may not accept her is it was Against Medical Advise, they would need a DC summary etc to DC to a rehab, I explained that I would ask the physician to call him and discuss the patient's medical status, I provided the physician with Rebecca Cruz phone number and requested him to call the son.       Expected Discharge Plan and Services                                                 Social Determinants of Health (SDOH) Interventions    Readmission Risk Interventions No flowsheet data found.

## 2020-03-26 NOTE — Progress Notes (Signed)
Physical Therapy Treatment Patient Details Name: Rebecca Cruz MRN: [id number] DOB: 06-09-1924 Today's Date: 03/26/2020    History of Present Illness Pt is a 86 y.o. female presenting to hospital 6/27 s/p fall (tripped and fell last night) sustaining R hip pain.  Imaging showing: "Displaced comminuted fracture through the right femoral intertrochanteric region. A nondisplaced fracture through the right inferior pubic ramus is not excluded."  Per MD Posey Pronto note (ortho): "We will additionally plan to treat possible right inferior pubic ramus fracture non-operatively as there are no other signs of significant pelvic ring injury."  Pt s/p IM nailing of R femur with cephalomedullary device 6/28 secondary R intertrochanteric hip fx.  PMH includes htn, thyroid disease, aortic valve repair, appendectomy, abdominal hysterectomy, stage IV CKD, a-fib.    PT Comments    Therapist received phone call that pt was ready to get back to bed.  Pt sitting up in recliner talking with her visitor upon PT entry and pt reporting feeling uncomfortable in recliner (and also feeling tired).  Pt requiring max assist to scoot forward in chair; then max assist for stand pivot recliner to bed; and then max assist for trunk and B LE's sit to semi-supine in bed.  Pt overall appearing fatigued with decreased activity tolerance (pt falling asleep shortly after laying down in bed).  Will continue to focus on strengthening and progressive functional mobility per pt tolerance.   Follow Up Recommendations  SNF     Equipment Recommendations  Rolling walker with 5" wheels;3in1 (PT) (youth sized)    Recommendations for Other Services OT consult     Precautions / Restrictions Precautions Precautions: Fall Restrictions Weight Bearing Restrictions: Yes Other Position/Activity Restrictions: WBAT LE    Mobility  Bed Mobility Overal bed mobility: Needs Assistance Bed Mobility: Sit to Supine     Sit to supine: Max assist;HOB  elevated   General bed mobility comments: assist for trunk and B LE's; 2 assist to boost pt up in bed via use of bed linens  Transfers Overall transfer level: Needs assistance Equipment used: None Transfers: Stand Pivot Transfers   Stand pivot transfers: Max assist       General transfer comment: stand pivot to L recliner to bed with max assist x1; vc's for transfer technique  Ambulation/Gait             General Gait Details: not appropriate at this time d/t weakness and assist levels with mobility   Stairs             Wheelchair Mobility    Modified Rankin (Stroke Patients Only)       Balance Overall balance assessment: Needs assistance Sitting-balance support: Bilateral upper extremity supported;Feet supported Sitting balance-Leahy Scale: Poor Sitting balance - Comments: mod assist for sitting balance (L lean noted--pt did not want to sit on her R hip d/t R hip pain) Postural control: Left lateral lean                                  Cognition Arousal/Alertness: Awake/alert (Sleepy in bed end of session) Behavior During Therapy: WFL for tasks assessed/performed Overall Cognitive Status: Impaired/Different from baseline                                 General Comments: Oriented to person and place; general confusion noted (may be d/t pt being tired)  Exercises      General Comments General comments (skin integrity, edema, etc.): no additional bruising noted this session.  Pt agreeable to PT session.      Pertinent Vitals/Pain Pain Assessment: Faces Faces Pain Scale: Hurts a little bit Pain Location: R hip/thigh Pain Descriptors / Indicators: Aching;Sore;Tender Pain Intervention(s): Limited activity within patient's tolerance;Monitored during session;Repositioned  Vitals (HR and O2 on room air) stable and WFL throughout treatment session.  BP 97/54 resting in bed end of session.    Home Living                       Prior Function            PT Goals (current goals can now be found in the care plan section) Acute Rehab PT Goals Patient Stated Goal: To be able to move better PT Goal Formulation: With patient Time For Goal Achievement: 04/06/20 Potential to Achieve Goals: Fair Progress towards PT goals: Progressing toward goals    Frequency    BID      PT Plan Current plan remains appropriate    Co-evaluation              AM-PAC PT "6 Clicks" Mobility   Outcome Measure  Help needed turning from your back to your side while in a flat bed without using bedrails?: A Little Help needed moving from lying on your back to sitting on the side of a flat bed without using bedrails?: A Lot Help needed moving to and from a bed to a chair (including a wheelchair)?: A Lot Help needed standing up from a chair using your arms (e.g., wheelchair or bedside chair)?: A Lot Help needed to walk in hospital room?: Total Help needed climbing 3-5 steps with a railing? : Total 6 Click Score: 11    End of Session Equipment Utilized During Treatment: Gait belt Activity Tolerance: Patient limited by fatigue;Patient limited by pain Patient left: in bed;with call bell/phone within reach;with bed alarm set;with family/visitor present;with SCD's reapplied;Other (comment) (bed in lowest position; B heels floating via pillow support) Nurse Communication: Mobility status;Precautions PT Visit Diagnosis: Other abnormalities of gait and mobility (R26.89);Muscle weakness (generalized) (M62.81);History of falling (Z91.81);Difficulty in walking, not elsewhere classified (R26.2);Pain Pain - Right/Left: Right Pain - part of body: Hip     Time: 4627-0350 PT Time Calculation (min) (ACUTE ONLY): 28 min  Charges:  $Therapeutic Activity: 23-37 mins                     Leitha Bleak, PT 03/26/20, 2:22 PM

## 2020-03-26 NOTE — Progress Notes (Signed)
Physical Therapy Treatment Patient Details Name: Rebecca Cruz MRN: [id number] DOB: [birth year] Today's Date: 03/26/2020    History of Present Illness Pt is a 86 y.o. female presenting to hospital 6/27 s/p fall (tripped and fell last night) sustaining R hip pain.  Imaging showing: "Displaced comminuted fracture through the right femoral intertrochanteric region. A nondisplaced fracture through the right inferior pubic ramus is not excluded."  Per MD Posey Pronto note (ortho): "We will additionally plan to treat possible right inferior pubic ramus fracture non-operatively as there are no other signs of significant pelvic ring injury."  Pt s/p IM nailing of R femur with cephalomedullary device 6/28 secondary R intertrochanteric hip fx.  PMH includes htn, thyroid disease, aortic valve repair, appendectomy, abdominal hysterectomy, stage IV CKD, a-fib.    PT Comments      Pt resting in bed upon PT arrival and reports eating breakfast already this morning. Pt reporting back and R hip pain resting in bed and agreeable to getting OOB to recliner. Pt's phone rang and pt answered phone with assist. While pt was on phone, pt requesting sips of water so pt brought cup of water with straw and instructed to take small sips (with HOB elevated); first few sips no issues noted but then when pt took another sip pt started coughing so deferred further drinking. Pt's breakfast tray then arrived (although pt reporting already eating breakfast). Then pt started to tell therapist that her R arm was "numb" and she just did not feel right; upon further questioning pt reporting R>L UE numbness that was new but pt had difficulty describing this feeling. Nursing notified immediately and came to assess pt and reported she would notify MD. Pt repositioned in bed to attempt to improve comfort and deferred further activity.  Therapist then received call from pt's nurse that MD cleared pt to participate in therapy and requesting pt  get up to recliner so therapist returned to pt's room; pt agreeable to getting to recliner.  Max assist semi-supine to sitting edge of bed; mod assist for sitting balance d/t L lean (d/t R hip pain); and max assist stand pivot bed to recliner; 2 assist to boost pt back in recliner using bed linen.  Will continue to focus on strengthening and progressive functional mobility per pt tolerance.   Follow Up Recommendations  SNF     Equipment Recommendations  Rolling walker with 5" wheels;3in1 (PT); (youth sized)   Recommendations for Other Services OT consult     Precautions / Restrictions Precautions Precautions: Fall Restrictions Weight Bearing Restrictions: Yes Other Position/Activity Restrictions: WBAT LE    Mobility  Bed Mobility Overal bed mobility: Needs Assistance Bed Mobility: Supine to Sit     Supine to sit: Max assist;HOB elevated     General bed mobility comments: assist for trunk and R>L LE  Transfers Overall transfer level: Needs assistance Equipment used: None Transfers: Stand Pivot Transfers   Stand pivot transfers: Max assist       General transfer comment: stand pivot to R bed to recliner with max assist x1; vc's for transfer technique  Ambulation/Gait             General Gait Details: not appropriate at this time d/t weakness and assist levels with mobility   Stairs             Wheelchair Mobility    Modified Rankin (Stroke Patients Only)       Balance Overall balance assessment: Needs assistance Sitting-balance support: Bilateral upper extremity supported;Feet  supported Sitting balance-Leahy Scale: Poor Sitting balance - Comments: mod assist for sitting balance (L lean noted--pt did not want to sit on her R hip d/t R hip pain) Postural control: Left lateral lean                                  Cognition Arousal/Alertness: Awake/alert (Drowsy at times) Behavior During Therapy: WFL for tasks  assessed/performed Overall Cognitive Status: Within Functional Limits for tasks assessed                                 General Comments: Oriented to person and place; pt reported eating her breakfast already although pt's breakfast tray arrived during session; remembered therapists name      Exercises      General Comments General comments (skin integrity, edema, etc.): bruising noted posterior medial R lower leg (extending from medial R thigh)--nurse notified.  Nursing cleared pt for participation in physical therapy.  Pt agreeable to PT session.      Pertinent Vitals/Pain Pain Assessment: Faces Pain Score: 8  Pain Location: R hip/thigh Pain Descriptors / Indicators: Aching;Sore;Tender Pain Intervention(s): Limited activity within patient's tolerance;Monitored during session;Premedicated before session;Repositioned  Vitals (HR and O2 on room air) stable and WFL throughout treatment session.    Home Living                      Prior Function            PT Goals (current goals can now be found in the care plan section) Acute Rehab PT Goals Patient Stated Goal: To be able to move better PT Goal Formulation: With patient Time For Goal Achievement: 04/06/20 Potential to Achieve Goals: Fair Progress towards PT goals: Progressing toward goals    Frequency    BID      PT Plan Current plan remains appropriate    Co-evaluation              AM-PAC PT "6 Clicks" Mobility   Outcome Measure  Help needed turning from your back to your side while in a flat bed without using bedrails?: A Little Help needed moving from lying on your back to sitting on the side of a flat bed without using bedrails?: A Lot Help needed moving to and from a bed to a chair (including a wheelchair)?: A Lot Help needed standing up from a chair using your arms (e.g., wheelchair or bedside chair)?: A Lot Help needed to walk in hospital room?: Total Help needed climbing 3-5  steps with a railing? : Total 6 Click Score: 11    End of Session Equipment Utilized During Treatment: Gait belt Activity Tolerance: Patient limited by fatigue;Patient limited by pain Patient left: in chair;with call bell/phone within reach;with chair alarm set;with SCD's reapplied;Other (comment) (B heels floating via pillow support; pillows on pt's L side for support) Nurse Communication: Mobility status;Precautions;Other (comment) (pt's c/o numbness R>L UE and not feeling right) PT Visit Diagnosis: Other abnormalities of gait and mobility (R26.89);Muscle weakness (generalized) (M62.81);History of falling (Z91.81);Difficulty in walking, not elsewhere classified (R26.2);Pain Pain - Right/Left: Right Pain - part of body: Hip     Time: 0939 ([id number])-1002 PT Time Calculation (min) (ACUTE ONLY): 23 min  Charges:  $Therapeutic Activity: 23-37 mins  Leitha Bleak, PT 03/26/20, 2:06 PM

## 2020-03-26 NOTE — Progress Notes (Signed)
Patient complaining of "Not feeling right" despite vitals being stable. Complaining of right arm tingling but she has feeling in it. Holding her lower abdomen says it hurts and that her legs are hurting. PA [employer] paged. Got patient into a more comfortable position while I continue to assess and communicate with the doctors on her care.

## 2020-03-26 NOTE — TOC Progression Note (Signed)
Transition of Care [facility]) - Progression Note    Patient Details  Name: Franny Selvage MRN: [id number] Date of Birth: [birth year]  Transition of Care [facility]) CM/SW Contact  Su Hilt, RN Phone Number: 03/26/2020, 2:52 PM  Clinical Narrative:    Spoke with the patient' Son Eddie Dibbles, he alerted me that they are going to be using [employer], Judson Roch from [employer] called and asked that I fax H&P, Vaccine record for covid, [facility]  faxed to [phone], I faxed the requested information Attn Judson Roch        Expected Discharge Plan and Services                                                 Social Determinants of Health (SDOH) Interventions    Readmission Risk Interventions No flowsheet data found.

## 2020-03-26 NOTE — Consult Note (Signed)
[facility]  [phone] [phone]  ID: Rebecca Cruz OB: 04/02/1924  MR#: [id number]  AOZ#:[id number]  Patient Care Team: Patient, No Pcp Per as PCP - General (General Practice)  CHIEF COMPLAINT: Leukocytosis.  INTERVAL HISTORY: Patient is a 86 year old female who lives in [place].  She is visiting family members [place] and had a fall fracturing her right femur.  She underwent surgery approximately 4 days ago.  She is recovering slowly.  Currently she is sitting up in the chair alert answering questions appropriately.  She is noted to have a persistently elevated white blood cell count throughout her hospital admission.  She otherwise feels well.  She has no neurologic complaints.  She denies any recent fevers or illnesses.  She has a good appetite and denies weight loss.  She has no chest pain, shortness of breath, cough, or hemoptysis.  She denies any nausea, vomiting, constipation, or diarrhea.  She has no urinary complaints.  Patient otherwise feels well and offers no further specific complaints today.  REVIEW OF SYSTEMS:   Review of Systems  Constitutional: Negative.  Negative for fever, malaise/fatigue and weight loss.  Respiratory: Negative.  Negative for cough, hemoptysis and shortness of breath.   Cardiovascular: Negative.  Negative for chest pain and leg swelling.  Gastrointestinal: Negative.  Negative for abdominal pain.  Genitourinary: Negative.  Negative for dysuria.  Musculoskeletal: Positive for falls and joint pain.  Skin: Negative.  Negative for rash.  Neurological: Negative.  Negative for dizziness, focal weakness, weakness and headaches.  Psychiatric/Behavioral: Negative.  The patient is not nervous/anxious.     As per HPI. Otherwise, a complete review of systems is negative.  PAST MEDICAL HISTORY: Past Medical History:  Diagnosis Date  . Hypertension   . Thyroid disease     PAST SURGICAL HISTORY: Past  Surgical History:  Procedure Laterality Date  . ABDOMINAL HYSTERECTOMY    . AORTIC VALVE REPAIR    . APPENDECTOMY    . INTRAMEDULLARY (IM) NAIL INTERTROCHANTERIC Right 03/22/2020   Procedure: INTRAMEDULLARY (IM) NAIL INTERTROCHANTRIC;  Surgeon: Leim Fabry, MD;  Location: [facility] ORS;  Service: Orthopedics;  Laterality: Right;    FAMILY HISTORY: History reviewed. No pertinent family history.  ADVANCED DIRECTIVES (Y/N):  '@ADVDIR' @  HEALTH MAINTENANCE: Social History   Tobacco Use  . Smoking status: Never Smoker  . Smokeless tobacco: Never Used  Substance Use Topics  . Alcohol use: Yes    Comment: once a week   . Drug use: Not on file     Colonoscopy:  PAP:  Bone density:  Lipid panel:  Allergies  Allergen Reactions  . Codeine     With codeine    Current Facility-Administered Medications  Medication Dose Route Frequency Provider Last Rate Last Admin  . bisacodyl (DULCOLAX) suppository 10 mg  10 mg Rectal Daily PRN Leim Fabry, MD   10 mg at 03/25/20 1000  . calcium acetate (PHOSLO) capsule 667 mg  667 mg Oral BID WC Leim Fabry, MD   667 mg at 03/26/20 1118  . Chlorhexidine Gluconate Cloth 2 % PADS 6 each  6 each Topical Daily Enzo Bi, MD   6 each at 03/26/20 1119  . docusate sodium (COLACE) capsule 100 mg  100 mg Oral BID Leim Fabry, MD   100 mg at 03/26/20 1118  . feeding supplement (ENSURE ENLIVE) (ENSURE ENLIVE) liquid 237 mL  237 mL Oral BID BM Enzo Bi, MD   237 mL at 03/26/20 1121  . levothyroxine (SYNTHROID) tablet  75 mcg  75 mcg Oral Q0600 Leim Fabry, MD   75 mcg at 03/26/20 0531  . loratadine (CLARITIN) tablet 10 mg  10 mg Oral Daily Leim Fabry, MD   10 mg at 03/26/20 1118  . methocarbamol (ROBAXIN) tablet 500 mg  500 mg Oral Q6H PRN Leim Fabry, MD   500 mg at 03/25/20 0454   Or  . methocarbamol (ROBAXIN) 500 mg in dextrose 5 % 50 mL IVPB  500 mg Intravenous Q6H PRN Leim Fabry, MD      . metoCLOPramide (REGLAN) tablet 5-10 mg  5-10 mg Oral Q8H PRN  Leim Fabry, MD       Or  . metoCLOPramide (REGLAN) injection 5-10 mg  5-10 mg Intravenous Q8H PRN Leim Fabry, MD      . metoprolol succinate (TOPROL-XL) 24 hr tablet 50 mg  50 mg Oral Daily Leim Fabry, MD   50 mg at 03/26/20 1118  . ondansetron (ZOFRAN) tablet 4 mg  4 mg Oral Q6H PRN Leim Fabry, MD       Or  . ondansetron [facility]) injection 4 mg  4 mg Intravenous Q6H PRN Leim Fabry, MD      . oxyCODONE (Oxy IR/ROXICODONE) immediate release tablet 2.5-5 mg  2.5-5 mg Oral Q3H PRN Leim Fabry, MD   5 mg at 03/25/20 1841  . polyethylene glycol (MIRALAX / GLYCOLAX) packet 17 g  17 g Oral Daily Modena Jansky, MD   17 g at 03/25/20 0805  . senna-docusate (Senokot-S) tablet 1 tablet  1 tablet Oral QHS PRN Leim Fabry, MD      . sodium phosphate (FLEET) 7-19 GM/118ML enema 1 enema  1 enema Rectal Once PRN Leim Fabry, MD      . traMADol Veatrice Bourbon) tablet 50 mg  50 mg Oral Q6H PRN Leim Fabry, MD   50 mg at 03/26/20 1119    OBJECTIVE: Vitals:   03/26/20 0027 03/26/20 0820  BP: 116/64 123/60  Pulse: 91 76  Resp: 18 18  Temp: 97.8 F (36.6 C) 97.7 F (36.5 C)  SpO2: 91% 97%     Body mass index is 22.19 kg/m.    ECOG FS:2 - Symptomatic, <50% confined to bed  General: Thin, no acute distress.  Sitting up in recliner. Eyes: Pink conjunctiva, anicteric sclera. HEENT: Normocephalic, moist mucous membranes. Lungs: No audible wheezing or coughing. Heart: Regular rate and rhythm. Abdomen: Soft, nontender, no obvious distention. Musculoskeletal: No edema, cyanosis, or clubbing. Neuro: Alert, answering all questions appropriately. Cranial nerves grossly intact. Skin: No rashes or petechiae noted. Psych: Normal affect. Lymphatics: No cervical, calvicular, axillary or inguinal LAD.   LAB RESULTS:  Lab Results  Component Value Date   NA 137 03/26/2020   K 4.8 03/26/2020   CL 108 03/26/2020   CO2 20 (L) 03/26/2020   GLUCOSE 139 (H) 03/26/2020   BUN 63 (H) 03/26/2020    CREATININE 1.41 (H) 03/26/2020   CALCIUM 7.9 (L) 03/26/2020   GFRNONAA 32 (L) 03/26/2020   GFRAA 37 (L) 03/26/2020    Lab Results  Component Value Date   WBC 26.4 (H) 03/26/2020   NEUTROABS 20.0 (H) 03/25/2020   HGB 8.2 (L) 03/26/2020   HCT 26.1 (L) 03/26/2020   MCV 100.0 03/26/2020   PLT 326 03/26/2020     STUDIES: DG Chest 1 View  Result Date: 03/21/2020 CLINICAL DATA:  Preoperative study EXAM: CHEST  1 VIEW COMPARISON:  None. FINDINGS: Cardiomegaly. Diffuse interstitial opacities and mild Kerley B lines. No pneumothorax.  No nodules or masses. No focal infiltrates. IMPRESSION: Cardiomegaly and mild edema. Electronically Signed   By: Dorise Bullion III M.D   On: 03/21/2020 09:09   DG Pelvis 1-2 Views  Result Date: 03/21/2020 CLINICAL DATA:  Pain after fall EXAM: PELVIS - 1-2 VIEW COMPARISON:  None. FINDINGS: A comminuted displaced fracture through the right femoral intertrochanteric region is identified. A nondisplaced left inferior pubic ramus fracture is not excluded. No other bony abnormalities in the pelvic bones. Vascular calcifications. IMPRESSION: 1. Displaced comminuted fracture through the right femoral intertrochanteric region. A nondisplaced fracture through the right inferior pubic ramus is not excluded. Electronically Signed   By: Dorise Bullion III M.D   On: 03/21/2020 09:12   CT Head Wo Contrast  Result Date: 03/21/2020 CLINICAL DATA:  Pt arrives [facility] from neices home. Pt visiting from [place]. Uses cane/walked. States she lost her balance and fell on floor inside house. Swelling noted to R upper thigh. Internal rotation to R leg. Shortening as well. A&. TKV EXAM: CT HEAD WITHOUT CONTRAST TECHNIQUE: Contiguous axial images were obtained from the base of the skull through the vertex without intravenous contrast. COMPARISON:  None. FINDINGS: Brain: No evidence of acute infarction, hemorrhage, hydrocephalus, extra-axial collection or mass lesion/mass effect. There is  ventricular sulcal enlargement reflecting mild diffuse atrophy. Patchy bilateral white matter hypoattenuation is also noted consistent with chronic microvascular ischemic change. Vascular: No hyperdense vessel or unexpected calcification. Skull: Normal. Negative for fracture or focal lesion. Sinuses/Orbits: Visualized globes and orbits are unremarkable. The visualized sinuses are clear. Other: There is a small amount of soft tissue air below the skull base, projecting adjacent to the right pterygoid plates, posterior to the right maxillary sinus and in the infratemporal fossa. The etiology of this is unclear. IMPRESSION: 1. No acute intracranial abnormalities. 2. Mild atrophy and moderate chronic microvascular ischemic change. 3. Small amount of soft tissue air at the right skull base as detailed above. Consider a facial fracture, follow-up facial CT, if there is facial trauma. Electronically Signed   By: Lajean Manes M.D.   On: 03/21/2020 09:14   DG Abd Portable 1V  Result Date: 03/25/2020 CLINICAL DATA:  Abdominal pain and distension EXAM: PORTABLE ABDOMEN - 1 VIEW COMPARISON:  None. FINDINGS: There is moderate stool throughout the colon. There is no bowel dilatation or air-fluid level to suggest bowel obstruction. No free air. There are scattered foci of arterial vascular calcification. There is postoperative change in the proximal right femur. Bones are osteoporotic. IMPRESSION: Moderate stool in colon.  No bowel obstruction or free air evident. Electronically Signed   By: Lowella Grip III M.D.   On: 03/25/2020 10:10   DG HIP OPERATIVE UNILAT W OR W/O PELVIS RIGHT  Result Date: 03/22/2020 CLINICAL DATA:  Right proximal femur fracture. EXAM: OPERATIVE right HIP (WITH PELVIS IF PERFORMED) 6 VIEWS TECHNIQUE: Fluoroscopic spot image(s) were submitted for interpretation post-operatively. FLUOROSCOPY TIME:  2 minutes 5 seconds. COMPARISON:  March 21, 2020. FINDINGS: Six intraoperative fluoroscopic images  were obtained of the right hip and femur. These images demonstrate surgical internal fixation of proximal right femoral fracture with intramedullary rod fixation of the right femoral shaft. Improved alignment of fracture components is noted. IMPRESSION: Status post surgical internal fixation of proximal right femoral fracture. Electronically Signed   By: Marijo Conception M.D.   On: 03/22/2020 13:58   DG Femur Min 2 Views Right  Result Date: 03/21/2020 CLINICAL DATA:  Preoperative study EXAM: RIGHT FEMUR 2 VIEWS COMPARISON:  None. FINDINGS: A displaced comminuted intertrochanteric fracture seen in the right hip. The remainder of the femur is intact. The proximal tibia and fibula are intact. No femoral head dislocation. A fracture through the left inferior pubic ramus is not excluded. No other acute abnormalities. IMPRESSION: 1. The patient has a displaced comminuted fracture through the right femoral intertrochanteric region without dislocation. 2. A nondisplaced left inferior pubic ramus fracture is not excluded. 3. No other abnormalities. Electronically Signed   By: Dorise Bullion III M.D   On: 03/21/2020 09:11    ASSESSMENT: Leukocytosis, right leg fracture.  PLAN:    1.  Leukocytosis: Patient noted to have a normal white blood cell count approximately 3 months ago with routine blood draw in [place].  She has no obvious infectious etiology.  Elevated white blood cell count, although persistent, is still likely reactive.  Will get peripheral blood flow cytometry for completeness.  No further intervention is needed at this time.  Patient does not require bone marrow biopsy.  Continue to monitor. 2.  Right femur fracture: Continue monitoring and treatment per orthopedics. 3.  Disposition: Upon discharge, plan is to transfer patient to a SNF in [place].  Appreciate consult, will follow.   Lloyd Huger, MD   03/26/2020 4:37 PM

## 2020-03-26 NOTE — Progress Notes (Signed)
  Subjective: 4 Days Post-Op Procedure(s) (LRB): INTRAMEDULLARY (IM) NAIL INTERTROCHANTRIC (Right) Patient reports pain as mild.  Complaining of some back pain. Patient is well, and has had no acute complaints or problems Plan is to go Rehab in [place] after hospital stay. Negative for chest pain and shortness of breath Fever: no Gastrointestinal: Negative for nausea and vomiting  Objective: Vital signs in last 24 hours: Temp:  [97.8 F (36.6 C)-98.4 F (36.9 C)] 97.8 F (36.6 C) (07/02 0027) Pulse Rate:  [72-92] 91 (07/02 0027) Resp:  [18] 18 (07/02 0027) BP: (97-116)/(43-81) 116/64 (07/02 0027) SpO2:  [91 %-97 %] 91 % (07/02 0027)  Intake/Output from previous day:  Intake/Output Summary (Last 24 hours) at 03/26/2020 0620 Last data filed at 03/25/2020 1550 Gross per 24 hour  Intake --  Output 200 ml  Net -200 ml    Intake/Output this shift: No intake/output data recorded.  Labs: Recent Labs    03/24/20 0345 03/25/20 0456 03/25/20 1501  HGB 8.1* 8.6* 8.2*   Recent Labs    03/25/20 0456 03/25/20 1501  WBC 27.8* 27.5*  RBC 2.74* 2.61*  HCT 27.6* 26.3*  PLT 375 340   Recent Labs    03/24/20 0345 03/25/20 0456  NA 138 139  K 4.5 4.2  CL 108 109  CO2 23 22  BUN 58* 55*  CREATININE 1.27* 1.14*  GLUCOSE 119* 106*  CALCIUM 7.7* 7.8*   No results for input(s): LABPT, INR in the last 72 hours.   EXAM General - Patient is Alert and Oriented Extremity - Neurovascular intact Sensation intact distally Dorsiflexion/Plantar flexion intact Compartment soft Dressing/Incision -mild hemorrhagic drainage at the mid incision.  No change this morning.  It will be changed before discharge. Motor Function - intact, moving foot and toes well on exam.   Past Medical History:  Diagnosis Date  . Hypertension   . Thyroid disease     Assessment/Plan: 4 Days Post-Op Procedure(s) (LRB): INTRAMEDULLARY (IM) NAIL INTERTROCHANTRIC (Right) Principal Problem:    Intertrochanteric fracture of right femur (HCC) Active Problems:   Hypertension   Thyroid disease   Atrial fibrillation with RVR (HCC)   Intertrochanteric fracture of right femur, closed, initial encounter ([place])   Chronic kidney disease, stage 4 (severe) (HCC)   Protein-calorie malnutrition, severe  Estimated body mass index is 22.19 kg/m as calculated from the following:   Height as of this encounter: 5' (1.524 m).   Weight as of this encounter: 51.5 kg. Advance diet Up with therapy D/C IV fluids  Follow-up at a an orthopedic department in [place] in 2 weeks for staple removal.  DVT Prophylaxis - Foot Pumps, TED hose and Eliquis Weight-Bearing as tolerated to right leg  Reche [name], PA-C Orthopaedic Surgery 03/26/2020, 6:20 AM

## 2020-03-27 DIAGNOSIS — N179 Acute kidney failure, unspecified: Secondary | ICD-10-CM

## 2020-03-27 DIAGNOSIS — R338 Other retention of urine: Secondary | ICD-10-CM

## 2020-03-27 DIAGNOSIS — G9341 Metabolic encephalopathy: Secondary | ICD-10-CM

## 2020-03-27 DIAGNOSIS — S72141D Displaced intertrochanteric fracture of right femur, subsequent encounter for closed fracture with routine healing: Secondary | ICD-10-CM

## 2020-03-27 DIAGNOSIS — N189 Chronic kidney disease, unspecified: Secondary | ICD-10-CM

## 2020-03-27 LAB — CBC
HCT: 26.5 % — ABNORMAL LOW (ref 36.0–46.0)
Hemoglobin: 8.5 g/dL — ABNORMAL LOW (ref 12.0–15.0)
MCH: 31.3 pg (ref 26.0–34.0)
MCHC: 32.1 g/dL (ref 30.0–36.0)
MCV: 97.4 fL (ref 80.0–100.0)
Platelets: 361 10*3/uL (ref 150–400)
RBC: 2.72 MIL/uL — ABNORMAL LOW (ref 3.87–5.11)
RDW: 17 % — ABNORMAL HIGH (ref 11.5–15.5)
WBC: 30.9 10*3/uL — ABNORMAL HIGH (ref 4.0–10.5)
nRBC: 7.6 % — ABNORMAL HIGH (ref 0.0–0.2)

## 2020-03-27 LAB — BASIC METABOLIC PANEL
Anion gap: 9 (ref 5–15)
BUN: 69 mg/dL — ABNORMAL HIGH (ref 8–23)
CO2: 22 mmol/L (ref 22–32)
Calcium: 8.2 mg/dL — ABNORMAL LOW (ref 8.9–10.3)
Chloride: 108 mmol/L (ref 98–111)
Creatinine, Ser: 1.47 mg/dL — ABNORMAL HIGH (ref 0.44–1.00)
GFR calc Af Amer: 35 mL/min — ABNORMAL LOW (ref >60)
GFR calc non Af Amer: 30 mL/min — ABNORMAL LOW (ref >60)
Glucose, Bld: 118 mg/dL — ABNORMAL HIGH (ref 70–99)
Potassium: 4.9 mmol/L (ref 3.5–5.1)
Sodium: 139 mmol/L (ref 135–145)

## 2020-03-27 LAB — CK: Total CK: 487 U/L — ABNORMAL HIGH (ref 38–234)

## 2020-03-27 MED ORDER — SODIUM CHLORIDE 0.9 % IV BOLUS
250.0000 mL | Freq: Once | INTRAVENOUS | Status: AC
  Administered 2020-03-27: 250 mL via INTRAVENOUS

## 2020-03-27 MED ORDER — TRAMADOL HCL 50 MG PO TABS
50.0000 mg | ORAL_TABLET | Freq: Two times a day (BID) | ORAL | Status: DC | PRN
  Administered 2020-03-27 – 2020-04-02 (×7): 50 mg via ORAL
  Filled 2020-03-27 (×8): qty 1

## 2020-03-27 MED ORDER — SENNOSIDES-DOCUSATE SODIUM 8.6-50 MG PO TABS
2.0000 | ORAL_TABLET | Freq: Every day | ORAL | Status: DC
  Administered 2020-03-27 – 2020-04-02 (×6): 2 via ORAL
  Filled 2020-03-27 (×5): qty 2

## 2020-03-27 MED ORDER — APIXABAN 2.5 MG PO TABS
2.5000 mg | ORAL_TABLET | Freq: Two times a day (BID) | ORAL | Status: DC
  Administered 2020-03-27 – 2020-04-02 (×12): 2.5 mg via ORAL
  Filled 2020-03-27 (×12): qty 1

## 2020-03-27 MED ORDER — METOPROLOL SUCCINATE ER 25 MG PO TB24
25.0000 mg | ORAL_TABLET | Freq: Every day | ORAL | Status: DC
  Administered 2020-03-27 – 2020-04-02 (×6): 25 mg via ORAL
  Filled 2020-03-27 (×7): qty 1

## 2020-03-27 MED ORDER — HYDROMORPHONE HCL 1 MG/ML IJ SOLN
0.5000 mg | Freq: Three times a day (TID) | INTRAMUSCULAR | Status: DC | PRN
  Administered 2020-03-27 – 2020-03-28 (×2): 0.5 mg via INTRAVENOUS
  Filled 2020-03-27 (×2): qty 1

## 2020-03-27 MED ORDER — SODIUM CHLORIDE 0.9 % IV SOLN: INTRAVENOUS | Status: AC

## 2020-03-27 MED ORDER — TRAMADOL HCL 50 MG PO TABS
50.0000 mg | ORAL_TABLET | Freq: Two times a day (BID) | ORAL | Status: DC | PRN
  Administered 2020-03-27: 50 mg via ORAL
  Filled 2020-03-27: qty 1

## 2020-03-27 MED ORDER — ACETAMINOPHEN 325 MG PO TABS
650.0000 mg | ORAL_TABLET | Freq: Four times a day (QID) | ORAL | Status: DC | PRN
  Administered 2020-03-27 – 2020-04-02 (×10): 650 mg via ORAL
  Filled 2020-03-27 (×10): qty 2

## 2020-03-27 NOTE — Progress Notes (Signed)
PT Cancellation Note  Patient Details Name: Rebecca Cruz MRN: [id number] DOB: [birth year]   Cancelled Treatment:     PT attempt.Pt refused at this time. Pt reports 10/10 pain. Assisted with pt getting repositioned /more comfortable in bed. RN notified. Pt falls asleep attempting to eat breakfast when therapist exited room. Therapist will return later this date.   Willette Pa 03/27/2020, 10:01 AM

## 2020-03-27 NOTE — Plan of Care (Signed)

## 2020-03-27 NOTE — Progress Notes (Addendum)
PROGRESS NOTE   Rebecca Cruz  KGU:[id number]    DOB: [birth year]    DOA: 03/21/2020  PCP: Patient, No Pcp Per   I have briefly reviewed patients previous medical records in [facility]  Chief Complaint  Patient presents with  . Fall    Brief Narrative:  86 year old female with PMH of hypertension, hypothyroidism, stage IV chronic kidney disease, atrial fibrillation on chronic anticoagulation, originally from [place], visiting family in the [place] area, presented to ED via EMS for evaluation of right hip pain.  S/p IM intertrochanteric nail on 6/28 for intertrochanteric fracture of right femur.  Hospital course complicated by ecchymosis of right medial thigh possibly related to surgical manipulation, some acute blood loss anemia, acute kidney injury, acute urinary retention, AMS and overall slow progress.  Hematology consulted for leukocytosis.   Assessment & Plan:  Principal Problem:   Intertrochanteric fracture of right femur [facility]) Active Problems:   Hypertension   Thyroid disease   Atrial fibrillation with RVR (HCC)   Intertrochanteric fracture of right femur, closed, initial encounter ([place])   Chronic kidney disease, stage 4 (severe) (HCC)   Protein-calorie malnutrition, severe   Leukocytosis   Intertrochanteric fracture of right femur, s/p IM nail 6/28.  Sustained status post fall and had a displaced comminuted fracture.  Orthopedics following. WBAT to right lower extremity.  Although she may follow-up with [facility] clinic orthopedics in 2 weeks for staple removal, since she is going to SNF in [place], will need to set up follow-up with orthopedics MD locally.  Hospital course complicated by extensive right medial thigh bruising extending from mons pubis/groin mostly up to the back of the right knee in a bandlike fashion, some of it tracking faintly to the right ankle.  Clinically no worse over the last 48 hours.  Hemoglobin has remained stable. Per orthopedics,  this was possibly related to some manipulation of the medial aspect of the extremity during surgery which may have precipitated some bleeding.  Eliquis temporarily held for about 48 hours, will consider resuming today.  Pain management-judicious use of opioids.  Continue mobilizing with therapies.  AKI complicating stage IV chronic kidney disease: Not present on admission.  Creatinine peaked to 1.42 on 6/29.  Creatinine had improved to 1.14 but back up to 1.4 >1.4 at this time.  May be related to poor oral intake.  Oral mucosa dry.  Gentle IV fluids today, encourage oral fluid intake.  Follow BMP closely.  CK 487, rhabdo less likely to be because of AKI.  History of atrial fibrillation, likely persistent A. fib: Continue metoprolol. Eliquis held after 7/1 morning's dose due to concern for bruising/bleeding of right thigh.  I discussed with the orthopedic team 7/2 and they recommend continuing to hold for yesterday and consider restarting today.  Since no worsening of right lower extremity ecchymosis and hemoglobin stable, resume Eliquis.  Essential hypertension: Soft blood pressures, may be related to volume depletion, reduced Toprol-XL from 50 to 25 mg daily.  Losartan held due to AKI.  Hypothyroid: Continue Synthroid.  Postop acute blood loss anemia: Hemoglobin down from 11.2-8.1.  Hemoglobin has been stable in the 8 g range for several days now.  Continue to follow CBCs and transfuse if hemoglobin 7 g or less.  Leukocytosis: WBC 18 on presentation which progressively increased, 22.3 > 26.7 > 32.6.?  Stress response.  No overt features of infectious cause.  As per family's report to prior MD, has history of elevated WBC.  Holding antibiotics.  However review  in care everywhere shows WBC in the 11-12 range in 2019 and 2020.  Appreciate hematology consultation and they do suspect that it is likely reactive.  Peripheral smear without concerning features.  Flow cytometry pending.  WBC up again to 31K.   Urine microscopy without UTI features.  Acute urinary retention: After 2 attempts of in and out catheterization last night, eventually had Foley catheter placed 7/1.  DC Foley catheter and monitor closely for voiding.  Discussed with RN at bedside.  Acute metabolic encephalopathy: Likely multifactorial related to pain, pain medicines, hospitalization in a very elderly female patient who may have some underlying cognitive impairment.  Supportive care, minimize opioids, redirect.  Urine microscopy 7/1 without bacteria or nitrites.  Some pyuria.  Body mass index is 22.19 kg/m.  Nutritional Status Nutrition Problem: Severe Malnutrition Etiology: chronic illness (CKD) Signs/Symptoms: severe fat depletion, severe muscle depletion Interventions: Refer to RD note for recommendations  DVT prophylaxis: Eliquis Code Status: DNR Family Communication: I discussed in detail with patient's son via phone on 7/2, updated care and answered all questions.  Advised him that it is unlikely that patient will be ready for discharge by tomorrow given her slow progress and some setbacks.  He verbalized understanding. Disposition:  Status is: Inpatient  Remains inpatient appropriate because:Inpatient level of care appropriate due to severity of illness   Dispo: The patient is from: Home              Anticipated d/c is to: SNF              Anticipated d/c date is: 3 days.  Likely early next week              Patient currently is not medically stable to d/c.        Consultants:   Orthopedic Hematology  Procedures:   S/p right hip fracture IM nail 6/28 Foley catheter 7/1-7/3  Antimicrobials:   None   Subjective:  .  Patient interviewed and examined along with female RN in room.  Appears slightly confused.  Holding onto form stating that somebody called but hung up before she could answer.  Tearful at times.  Reports severe right hip pain but no abdominal pain.  Had a BM last night per  RN.  Objective:   Vitals:   03/26/20 2314 03/26/20 2315 03/27/20 0132 03/27/20 0730  BP: (!) 99/49 (!) 99/49 (!) 103/48 (!) 106/53  Pulse: 85 67 61 98  Resp: 19 19  17   Temp: 98.2 F (36.8 C) 98.2 F (36.8 C)    TempSrc: Oral Oral    SpO2: 93% 92%  (!) 88%  Weight:      Height:        General exam: Elderly female, moderately built and frail, lying propped up in bed.  Oral mucosa dry. Respiratory system: Clear to auscultation.  No increased work of breathing. Cardiovascular system: S1 & S2 heard, irregularly irregular. No JVD, rubs, gallops or clicks.  2/6 systolic murmur heard at apex.  Apart from mild right upper thigh edema related to surgical manipulation, not much peripheral edema. Gastrointestinal system: Abdomen nondistended, nontender and soft. No organomegaly or masses appreciated.  Normal bowel sounds heard.. Central nervous system: Alert and oriented x self. No focal neurological deficits. Extremities: Symmetric 5 x 5 power.  Right hip surgical site dressing clean and dry. Noted some swelling over the medial aspect of the right thigh with a band of bruising extending from the right groin/mons pubis to the  right popliteal fossa, and faint tracking of this bruising down to right ankle-no significant progression in the last 48 hours. Skin: No rashes, lesions or ulcers Psychiatry: Judgement and insight impaired. Mood & affect somewhat confused.     Data Reviewed:   I have personally reviewed following labs and imaging studies   CBC: Recent Labs  Lab 03/25/20 0456 03/25/20 0456 03/25/20 1501 03/26/20 0632 03/27/20 0344  WBC 27.8*   < > 27.5* 26.4* 30.9*  NEUTROABS 20.0*  --   --   --   --   HGB 8.6*   < > 8.2* 8.2* 8.5*  HCT 27.6*   < > 26.3* 26.1* 26.5*  MCV 100.7*   < > 100.8* 100.0 97.4  PLT 375   < > 340 326 361   < > = values in this interval not displayed.    Basic Metabolic Panel: Recent Labs  Lab 03/23/20 0345 03/23/20 0345 03/24/20 0345  03/24/20 0345 03/25/20 0456 03/26/20 0632 03/27/20 0344  NA 137   < > 138   < > 139 137 139  K 4.6   < > 4.5   < > 4.2 4.8 4.9  CL 105   < > 108   < > 109 108 108  CO2 25   < > 23   < > 22 20* 22  GLUCOSE 174*   < > 119*   < > 106* 139* 118*  BUN 51*   < > 58*   < > 55* 63* 69*  CREATININE 1.42*   < > 1.27*   < > 1.14* 1.41* 1.47*  CALCIUM 8.0*   < > 7.7*   < > 7.8* 7.9* 8.2*  MG 1.8  --  2.0  --  2.0  --   --    < > = values in this interval not displayed.    Liver Function Tests: No results for input(s): AST, ALT, ALKPHOS, BILITOT, PROT, ALBUMIN in the last 168 hours.  CBG: No results for input(s): GLUCAP in the last 168 hours.  Microbiology Studies:   Recent Results (from the past 240 hour(s))  SARS Coronavirus 2 by RT PCR (hospital order, performed in [facility] hospital lab) Nasopharyngeal Nasopharyngeal Swab     Status: None   Collection Time: 03/21/20  8:06 AM   Specimen: Nasopharyngeal Swab  Result Value Ref Range Status   SARS Coronavirus 2 NEGATIVE NEGATIVE Final    Comment: (NOTE) SARS-CoV-2 target nucleic acids are NOT DETECTED.  The SARS-CoV-2 RNA is generally detectable in upper and lower respiratory specimens during the acute phase of infection. The lowest concentration of SARS-CoV-2 viral copies this assay can detect is 250 copies / mL. A negative result does not preclude SARS-CoV-2 infection and should not be used as the sole basis for treatment or other patient management decisions.  A negative result may occur with improper specimen collection / handling, submission of specimen other than nasopharyngeal swab, presence of viral mutation(s) within the areas targeted by this assay, and inadequate number of viral copies (<250 copies / mL). A negative result must be combined with clinical observations, patient history, and epidemiological information.  Fact Sheet for Patients:   StrictlyIdeas.no  Fact Sheet for Healthcare  Providers: BankingDealers.co.za  This test is not yet approved or  cleared by the [place] FDA and has been authorized for detection and/or diagnosis of SARS-CoV-2 by FDA under an Emergency Use Authorization (EUA).  This EUA will remain in effect (meaning this test can  be used) for the duration of the COVID-19 declaration under Section 564(b)(1) of the Act, 21 U.S.C. section 360bbb-3(b)(1), unless the authorization is terminated or revoked sooner.  Performed at [facility], [address] [place], [place] [address]   Surgical PCR screen     Status: None   Collection Time: 03/21/20 12:15 PM   Specimen: Nasal Mucosa; Nasal Swab  Result Value Ref Range Status   MRSA, PCR NEGATIVE NEGATIVE Final   Staphylococcus aureus NEGATIVE NEGATIVE Final    Comment: (NOTE) The Xpert SA Assay (FDA approved for NASAL specimens in patients 59 years of age and older), is one component of a comprehensive surveillance program. It is not intended to diagnose infection nor to guide or monitor treatment. Performed at [facility], [address], [place], [place] [address]      Radiology Studies:  No results found.   Scheduled Meds:   . calcium acetate  667 mg Oral BID WC  . Chlorhexidine Gluconate Cloth  6 each Topical Daily  . docusate sodium  100 mg Oral BID  . feeding supplement (ENSURE ENLIVE)  237 mL Oral BID BM  . levothyroxine  75 mcg Oral Q0600  . metoprolol succinate  25 mg Oral Daily  . polyethylene glycol  17 g Oral BID  . senna-docusate  2 tablet Oral Daily    Continuous Infusions:   . sodium chloride    . methocarbamol (ROBAXIN) IV       LOS: 6 days     Vernell Leep, MD, [place], [facility]. [facility]    To contact the attending provider between 7A-7P or the covering provider during after hours 7P-7A, please log into the web site [email or web address] and access using universal [place] password for that web site. If you  do not have the password, please call the hospital operator.  03/27/2020, 10:52 AM

## 2020-03-27 NOTE — Progress Notes (Signed)
  Subjective: 5 Days Post-Op Procedure(s) (LRB): INTRAMEDULLARY (IM) NAIL INTERTROCHANTRIC (Right) Patient reports pain as well-controlled.   Patient is well, and has had no acute complaints or problems Plan is to go Skilled nursing facility after hospital stay. Negative for chest pain and shortness of breath Fever: no Patient sitting up in bedside chair eating. Niece present with her.   Objective: Vital signs in last 24 hours: Temp:  [97.4 F (36.3 C)-98.2 F (36.8 C)] 98.2 F (36.8 C) (07/02 2315) Pulse Rate:  [61-98] 98 (07/03 0730) Resp:  [17-19] 17 (07/03 0730) BP: (99-116)/(48-66) 106/53 (07/03 0730) SpO2:  [88 %-94 %] 88 % (07/03 0730)  Intake/Output from previous day:  Intake/Output Summary (Last 24 hours) at 03/27/2020 1438 Last data filed at 03/27/2020 0401 Gross per 24 hour  Intake 0 ml  Output 950 ml  Net -950 ml    Intake/Output this shift: No intake/output data recorded.  Labs: Recent Labs    03/25/20 0456 03/25/20 1501 03/26/20 0632 03/27/20 0344  HGB 8.6* 8.2* 8.2* 8.5*   Recent Labs    03/26/20 0632 03/27/20 0344  WBC 26.4* 30.9*  RBC 2.61* 2.72*  HCT 26.1* 26.5*  PLT 326 361   Recent Labs    03/26/20 0632 03/27/20 0344  NA 137 139  K 4.8 4.9  CL 108 108  CO2 20* 22  BUN 63* 69*  CREATININE 1.41* 1.47*  GLUCOSE 139* 118*  CALCIUM 7.9* 8.2*   No results for input(s): LABPT, INR in the last 72 hours.   EXAM General - Patient is Alert, Appropriate and Oriented Extremity - Neurovascular intact Dorsiflexion/Plantar flexion intact Compartment soft . Previously noted ecchymosis noted over the medial thigh and leg. Soft and nontender to palpation. Dressing/Incision -pressure dressing previously applied is intact. Minimal serous drainage noted.  Motor Function - intact, moving foot and toes well on exam.  Cardiovascular- Regular rate and rhythm, no murmurs/rubs/gallops Respiratory- Lungs clear to auscultation  bilaterally    Assessment/Plan: 5 Days Post-Op Procedure(s) (LRB): INTRAMEDULLARY (IM) NAIL INTERTROCHANTRIC (Right) Principal Problem:   Intertrochanteric fracture of right femur (HCC) Active Problems:   Hypertension   Thyroid disease   Atrial fibrillation with RVR (HCC)   Intertrochanteric fracture of right femur, closed, initial encounter (HCC)   Chronic kidney disease, stage 4 (severe) (HCC)   Protein-calorie malnutrition, severe   Leukocytosis  Estimated body mass index is 22.19 kg/m as calculated from the following:   Height as of this encounter: 5' (1.524 m).   Weight as of this encounter: 51.5 kg. Advance diet Up with therapy Discharge to SNF when cleared by medicine    DVT Prophylaxis - Eloquis Weight-Bearing as tolerated to right leg  Cassell Smiles, PA-C [facility] Orthopaedic Surgery 03/27/2020, 2:38 PM

## 2020-03-27 NOTE — Progress Notes (Signed)
Physical Therapy Treatment Patient Details Name: Rebecca Cruz MRN: [id number] DOB: 1924/04/16 Today's Date: 03/27/2020    History of Present Illness Pt is a 86 y.o. female presenting to hospital 6/27 s/p fall (tripped and fell last night) sustaining R hip pain.  Imaging showing: "Displaced comminuted fracture through the right femoral intertrochanteric region. A nondisplaced fracture through the right inferior pubic ramus is not excluded."  Per MD Posey Pronto note (ortho): "We will additionally plan to treat possible right inferior pubic ramus fracture non-operatively as there are no other signs of significant pelvic ring injury."  Pt s/p IM nailing of R femur with cephalomedullary device 6/28 secondary R intertrochanteric hip fx.  PMH includes htn, thyroid disease, aortic valve repair, appendectomy, abdominal hysterectomy, stage IV CKD, a-fib.    PT Comments    Pt was long sitting in bed,asleep, with untouched lunch tray in front of her. She easily awakes however remains lethargic throughout most of session. Agreeable to OOB to recliner to attempt to eat lunch. Pt required max assist to progress to EOB and max assist to stand pivot to recliner. Pt was able to follow commands consistently however falls asleep several times during session. Once seated in recliner and lunch tray placed in front of her, she drifts off to sleep. RN aware. Acute PT will continue efforts to progress pt towards goals. Limited progress made 2/2 to alertness/pain/cognition deficits. She will require SNF at DC to address strength, balance, and safe functional mobility deficits. At conclusion of session, pt is seated in recliner with chair alarm in place and call bell in reach.    Follow Up Recommendations  SNF     Equipment Recommendations  Rolling walker with 5" wheels;3in1 (PT)    Recommendations for Other Services       Precautions / Restrictions Precautions Precautions: Fall Restrictions Weight Bearing  Restrictions: Yes RLE Weight Bearing: Weight bearing as tolerated    Mobility  Bed Mobility Overal bed mobility: Needs Assistance Bed Mobility: Supine to Sit     Supine to sit: Max assist;HOB elevated     General bed mobility comments: Max assist to progress from long sitting to short sit EOB. INcreased time and constant vcs for technique and sequencing. Therapist questions pt's effort 2/2 to lethargy form medication  Transfers Overall transfer level: Needs assistance Equipment used: None Transfers: Stand Pivot Transfers Sit to Stand: Max assist;From elevated surface Stand pivot transfers: Max assist       General transfer comment: pt was able to stand pivot to the L with max assist + max vcs. required max assist to reposition hips in chair. lunch tray placed in front of pt however pt falls asleep prior to eating. RN aware  Ambulation/Gait             General Gait Details: unable/unsafe to trial at this time   Stairs             Wheelchair Mobility    Modified Rankin (Stroke Patients Only)       Balance Overall balance assessment: Needs assistance Sitting-balance support: Bilateral upper extremity supported;Feet supported Sitting balance-Leahy Scale: Poor Sitting balance - Comments: constant min assist while seated EOB to prevent posterior LOB   Standing balance support: During functional activity Standing balance-Leahy Scale: Poor                              Cognition Arousal/Alertness: Lethargic;Suspect due to medications Behavior During Therapy: [facility] for  tasks assessed/performed Overall Cognitive Status: Impaired/Different from baseline                                 General Comments: Pt was asleep upon therapist arriving with untouched lunch tray in front of her. she easily awakes and agrees to try sitting in recliner to eat lunch. Pt is confused but able to follow commands consistently. Several times pt falls asleep  during session      Exercises      General Comments        Pertinent Vitals/Pain Pain Assessment:  (not rated ) Pain Location: R hip/thigh Pain Descriptors / Indicators: Aching;Sore;Tender Pain Intervention(s): Limited activity within patient's tolerance;Monitored during session;Premedicated before session;Repositioned;Ice applied    Home Living                      Prior Function            PT Goals (current goals can now be found in the care plan section) Acute Rehab PT Goals Patient Stated Goal: To be able to move better Progress towards PT goals: Not progressing toward goals - comment (pain/lethargy/cognition/alertness deficits slowing progress)    Frequency    BID      PT Plan Current plan remains appropriate    Co-evaluation              AM-PAC PT "6 Clicks" Mobility   Outcome Measure  Help needed turning from your back to your side while in a flat bed without using bedrails?: A Lot Help needed moving from lying on your back to sitting on the side of a flat bed without using bedrails?: A Lot Help needed moving to and from a bed to a chair (including a wheelchair)?: A Lot Help needed standing up from a chair using your arms (e.g., wheelchair or bedside chair)?: A Lot Help needed to walk in hospital room?: Total Help needed climbing 3-5 steps with a railing? : Total 6 Click Score: 10    End of Session Equipment Utilized During Treatment: Gait belt Activity Tolerance: Patient limited by fatigue;Patient limited by pain Patient left: in chair;with call bell/phone within reach;with chair alarm set;with family/visitor present Nurse Communication: Mobility status;Precautions PT Visit Diagnosis: Other abnormalities of gait and mobility (R26.89);Muscle weakness (generalized) (M62.81);History of falling (Z91.81);Difficulty in walking, not elsewhere classified (R26.2);Pain Pain - Right/Left: Right Pain - part of body: Hip     Time: 7416-3845 PT  Time Calculation (min) (ACUTE ONLY): 21 min  Charges:  $Therapeutic Activity: 8-22 mins                     Julaine Fusi PTA 03/27/20, 1:18 PM

## 2020-03-28 ENCOUNTER — Inpatient Hospital Stay: Payer: Medicare Other

## 2020-03-28 ENCOUNTER — Inpatient Hospital Stay: Admit: 2020-03-28 | Payer: Medicare Other

## 2020-03-28 LAB — CBC
HCT: 28.2 % — ABNORMAL LOW (ref 36.0–46.0)
Hemoglobin: 8.4 g/dL — ABNORMAL LOW (ref 12.0–15.0)
MCH: 30.5 pg (ref 26.0–34.0)
MCHC: 29.8 g/dL — ABNORMAL LOW (ref 30.0–36.0)
MCV: 102.5 fL — ABNORMAL HIGH (ref 80.0–100.0)
Platelets: 360 10*3/uL (ref 150–400)
RBC: 2.75 MIL/uL — ABNORMAL LOW (ref 3.87–5.11)
RDW: 17.6 % — ABNORMAL HIGH (ref 11.5–15.5)
WBC: 27.6 10*3/uL — ABNORMAL HIGH (ref 4.0–10.5)
nRBC: 7.4 % — ABNORMAL HIGH (ref 0.0–0.2)

## 2020-03-28 LAB — BASIC METABOLIC PANEL
Anion gap: 5 (ref 5–15)
Anion gap: 8 (ref 5–15)
BUN: 70 mg/dL — ABNORMAL HIGH (ref 8–23)
BUN: 70 mg/dL — ABNORMAL HIGH (ref 8–23)
CO2: 24 mmol/L (ref 22–32)
CO2: 22 mmol/L (ref 22–32)
Calcium: 8.1 mg/dL — ABNORMAL LOW (ref 8.9–10.3)
Calcium: 8.2 mg/dL — ABNORMAL LOW (ref 8.9–10.3)
Chloride: 110 mmol/L (ref 98–111)
Chloride: 109 mmol/L (ref 98–111)
Creatinine, Ser: 1.37 mg/dL — ABNORMAL HIGH (ref 0.44–1.00)
Creatinine, Ser: 1.32 mg/dL — ABNORMAL HIGH (ref 0.44–1.00)
GFR calc Af Amer: 38 mL/min — ABNORMAL LOW (ref >60)
GFR calc Af Amer: 40 mL/min — ABNORMAL LOW (ref >60)
GFR calc non Af Amer: 33 mL/min — ABNORMAL LOW (ref >60)
GFR calc non Af Amer: 34 mL/min — ABNORMAL LOW (ref >60)
Glucose, Bld: 98 mg/dL (ref 70–99)
Glucose, Bld: 103 mg/dL — ABNORMAL HIGH (ref 70–99)
Potassium: 5.7 mmol/L — ABNORMAL HIGH (ref 3.5–5.1)
Potassium: 5 mmol/L (ref 3.5–5.1)
Sodium: 139 mmol/L (ref 135–145)
Sodium: 139 mmol/L (ref 135–145)

## 2020-03-28 MED ORDER — FUROSEMIDE 10 MG/ML IJ SOLN
20.0000 mg | Freq: Once | INTRAMUSCULAR | Status: AC
  Administered 2020-03-28: 20 mg via INTRAVENOUS
  Filled 2020-03-28: qty 4

## 2020-03-28 MED ORDER — SODIUM CHLORIDE 0.9 % IV SOLN: INTRAVENOUS | Status: DC

## 2020-03-28 NOTE — Progress Notes (Signed)
PROGRESS NOTE   Rebecca Cruz  JHE:[id number]    DOB: March 12, 1924    DOA: 03/21/2020  PCP: Patient, No Pcp Per   I have briefly reviewed patients previous medical records in [facility].  Chief Complaint  Patient presents with  . Fall    Brief Narrative:  86 year old female with PMH of hypertension, hypothyroidism, stage IV chronic kidney disease, atrial fibrillation on chronic anticoagulation, originally from [place], visiting family in the [place] area, presented to ED via EMS for evaluation of right hip pain.  S/p IM intertrochanteric nail on 6/28 for intertrochanteric fracture of right femur.  Hospital course complicated by ecchymosis of right medial thigh possibly related to surgical manipulation, some acute blood loss anemia, acute kidney injury, acute urinary retention, AMS and overall slow progress.  Hematology consulted for leukocytosis.  RLE ecchymosis, ABLA stabilized.  Creatinine fluctuating?  Element of cardiorenal syndrome.  Mild CHF/coughing-IV Lasix.  Urinary retention resolved.  AMS better but not resolved.   Assessment & Plan:  Principal Problem:   Intertrochanteric fracture of right femur [facility]) Active Problems:   Hypertension   Thyroid disease   Atrial fibrillation with RVR (HCC)   Intertrochanteric fracture of right femur, closed, initial encounter ([place])   Chronic kidney disease, stage 4 (severe) (HCC)   Protein-calorie malnutrition, severe   Leukocytosis   Intertrochanteric fracture of right femur, s/p IM nail 6/28.  Sustained status post fall and had a displaced comminuted fracture.  Orthopedics following. WBAT to right lower extremity.  Although she may follow-up with [facility] clinic orthopedics in 2 weeks for staple removal, since she is going to SNF in [place], will need to set up follow-up with orthopedics [place] locally.  Hospital course complicated by extensive right medial thigh bruising extending from mons pubis/groin mostly up to the back of the  right knee in a bandlike fashion, some of it tracking faintly to the right ankle.  Stable over the last couple of days.  Hemoglobin has remained stable. Per orthopedics, this was possibly related to some manipulation of the medial aspect of the extremity during surgery which may have precipitated some bleeding.  Eliquis temporarily held for about 48 hours, resumed 7/3.  Pain management-judicious use of opioids.  Continue mobilizing with therapies.  As per communication with orthopedics on 7/3, no concern for infection at operative site.  Acute possibly diastolic CHF: As noted clinically and by chest x-ray on 7/3.  DC IV fluids.  Lasix 20 mg IV x1 dose.  TTE in care everywhere on 10/04/2018: LVEF 70+%, mild LVH.  Possible LV relaxation abnormality.  Moderate to severe TR.  Pulmonary hypertension.  PFO/ASD with left-to-right shunting.  AKI complicating stage IV chronic kidney disease: Not present on admission.  Creatinine peaked to 1.42 on 6/29.  Creatinine fluctuating, likely related to varying oral intake, briefly hydrated with IV fluids, creatinine plateaued in the 1.3 range. CK 487, rhabdo less likely to be because of AKI.  Checking renal ultrasound.  Also could be cardiorenal, follow-up after a dose of Lasix.  IV fluids discontinued due to pulmonary edema.  Normal creatinine/0.9 on admission.  History of atrial fibrillation, likely persistent A. fib: Continue reduced dose of Toprol-XL and Eliquis resumed after temporary hold for 48 hours.  No overt bleeding.  Essential hypertension: Soft blood pressures, may be related to volume depletion, reduced Toprol-XL from 50 to 25 mg daily.  Losartan held due to AKI.  Stable.  Hypothyroid: Continue Synthroid.  Postop acute blood loss anemia: Hemoglobin down from 11.2-8.1.  Hemoglobin has been stable in the 8 g range for several days now.  Continue to follow CBCs and transfuse if hemoglobin 7 g or less.  Leukocytosis: WBC 18 on presentation which progressively  increased, 22.3 > 26.7 > 32.6.?  Stress response.  No overt features of infectious cause.  As per family's report to prior MD, has history of elevated WBC.  Holding antibiotics.  However review in care everywhere shows WBC in the 11-12 range in 2019 and 2020.  Appreciate hematology consultation and they do suspect that it is likely reactive.  Peripheral smear without concerning features.  Flow cytometry pending.  WBC up again to 31K.  Urine microscopy without UTI features.  Chest x-ray 7/4 suggestive of pulmonary edema, no pneumonia noted.  Acute urinary retention: After 2 attempts of in and out catheterization last night, eventually had Foley catheter placed 7/1.  Foley catheter discontinued on 7/3.  Reportedly has been voiding since.  Acute metabolic encephalopathy: Likely multifactorial related to pain, pain medicines, hospitalization in a very elderly female patient who may have some underlying cognitive impairment.  Supportive care, minimize opioids, redirect.  Urine microscopy 7/1 without bacteria or nitrites.  Some pyuria.  Mental status seems to be better compared to yesterday but probably not at baseline  Body mass index is 22.19 kg/m.  Nutritional Status Nutrition Problem: Severe Malnutrition Etiology: chronic illness (CKD) Signs/Symptoms: severe fat depletion, severe muscle depletion Interventions: Refer to RD note for recommendations  DVT prophylaxis: Eliquis Code Status: DNR Family Communication: I discussed in detail with patient's son via phone on 7/2, updated care and answered all questions.  Advised him that it is unlikely that patient will be ready for discharge by tomorrow given her slow progress and some setbacks.  He verbalized understanding. Disposition:  Status is: Inpatient  Remains inpatient appropriate because:Inpatient level of care appropriate due to severity of illness   Dispo: The patient is from: Home              Anticipated d/c is to: SNF               Anticipated d/c date is: 3 days.  Likely early next week              Patient currently is not medically stable to d/c.        Consultants:   Orthopedic Hematology  Procedures:   S/p right hip fracture IM nail 6/28 Foley catheter 7/1-7/3  Antimicrobials:   None   Subjective:  Patient interviewed and examined along with her female RN in room.  Noted to be coughing frequently, slightly wet appearing cough.  Apart from cough patient reports some dyspnea.  No chest pain.  Did not complain of pain at right hip site.  Last BM?  7/2.  Reportedly voiding post Foley removal 7/3.  Still appears somewhat confused but better than yesterday.  Objective:   Vitals:   03/27/20 0132 03/27/20 0730 03/27/20 2340 03/28/20 0737  BP: (!) 103/48 (!) 106/53 (!) 111/59 121/66  Pulse: 61 98 71 86  Resp:  17 18 16   Temp:   97.9 F (36.6 C) 98.4 F (36.9 C)  TempSrc:   Oral Oral  SpO2:  (!) 88% 95% 93%  Weight:      Height:        General exam: Elderly female, moderately built and frail, lying propped up in bed.  Oral mucosa moist.  Looks better than she did yesterday. Respiratory system: Occasional bibasal crackles but otherwise  clear to auscultation. Cardiovascular system: S1 & S2 heard, irregularly irregular. No JVD, rubs, gallops or clicks.  2/6 systolic murmur heard at apex.  Mostly right thigh edema, no worse than last couple of days.  Trace bilateral ankle edema. Gastrointestinal system: Abdomen nondistended, nontender and soft. No organomegaly or masses appreciated.  Normal bowel sounds heard.. Central nervous system: Alert and oriented x self and partly to place. No focal neurological deficits. Extremities: Symmetric 5 x 5 power.  Right hip surgical site dressing clean and dry. Noted some swelling over the medial aspect of the right thigh with a band of bruising extending from the right groin/mons pubis to the right popliteal fossa, and faint tracking of this bruising down to right ankle-no  significant progression in the last 72 hours. Skin: No rashes, lesions or ulcers Psychiatry: Judgement and insight impaired. Mood & affect somewhat confused.     Data Reviewed:   I have personally reviewed following labs and imaging studies   CBC: Recent Labs  Lab 03/25/20 0456 03/25/20 1501 03/26/20 0632 03/27/20 0344 03/28/20 0459  WBC 27.8*   < > 26.4* 30.9* 27.6*  NEUTROABS 20.0*  --   --   --   --   HGB 8.6*   < > 8.2* 8.5* 8.4*  HCT 27.6*   < > 26.1* 26.5* 28.2*  MCV 100.7*   < > 100.0 97.4 102.5*  PLT 375   < > 326 361 360   < > = values in this interval not displayed.    Basic Metabolic Panel: Recent Labs  Lab 03/23/20 0345 03/23/20 0345 03/24/20 0345 03/24/20 0345 03/25/20 0456 03/26/20 2694 03/27/20 0344 03/28/20 0459 03/28/20 0749  NA 137   < > 138   < > 139   < > 139 139 139  K 4.6   < > 4.5   < > 4.2   < > 4.9 5.7* 5.0  CL 105   < > 108   < > 109   < > 108 110 109  CO2 25   < > 23   < > 22   < > 22 24 22   GLUCOSE 174*   < > 119*   < > 106*   < > 118* 98 103*  BUN 51*   < > 58*   < > 55*   < > 69* 70* 70*  CREATININE 1.42*   < > 1.27*   < > 1.14*   < > 1.47* 1.37* 1.32*  CALCIUM 8.0*   < > 7.7*   < > 7.8*   < > 8.2* 8.1* 8.2*  MG 1.8  --  2.0  --  2.0  --   --   --   --    < > = values in this interval not displayed.    Liver Function Tests: No results for input(s): AST, ALT, ALKPHOS, BILITOT, PROT, ALBUMIN in the last 168 hours.  CBG: No results for input(s): GLUCAP in the last 168 hours.  Microbiology Studies:   Recent Results (from the past 240 hour(s))  SARS Coronavirus 2 by RT PCR (hospital order, performed in [facility] hospital lab) Nasopharyngeal Nasopharyngeal Swab     Status: None   Collection Time: 03/21/20  8:06 AM   Specimen: Nasopharyngeal Swab  Result Value Ref Range Status   SARS Coronavirus 2 NEGATIVE NEGATIVE Final    Comment: (NOTE) SARS-CoV-2 target nucleic acids are NOT DETECTED.  The SARS-CoV-2 RNA is generally  detectable in upper  and lower respiratory specimens during the acute phase of infection. The lowest concentration of SARS-CoV-2 viral copies this assay can detect is 250 copies / mL. A negative result does not preclude SARS-CoV-2 infection and should not be used as the sole basis for treatment or other patient management decisions.  A negative result may occur with improper specimen collection / handling, submission of specimen other than nasopharyngeal swab, presence of viral mutation(s) within the areas targeted by this assay, and inadequate number of viral copies (<250 copies / mL). A negative result must be combined with clinical observations, patient history, and epidemiological information.  Fact Sheet for Patients:   StrictlyIdeas.no  Fact Sheet for Healthcare Providers: BankingDealers.co.za  This test is not yet approved or  cleared by the [place] FDA and has been authorized for detection and/or diagnosis of SARS-CoV-2 by FDA under an Emergency Use Authorization (EUA).  This EUA will remain in effect (meaning this test can be used) for the duration of the COVID-19 declaration under Section 564(b)(1) of the Act, 21 U.S.C. section 360bbb-3(b)(1), unless the authorization is terminated or revoked sooner.  Performed at [facility], [address] [place], [place] [address]   Surgical PCR screen     Status: None   Collection Time: 03/21/20 12:15 PM   Specimen: Nasal Mucosa; Nasal Swab  Result Value Ref Range Status   MRSA, PCR NEGATIVE NEGATIVE Final   Staphylococcus aureus NEGATIVE NEGATIVE Final    Comment: (NOTE) The Xpert SA Assay (FDA approved for NASAL specimens in patients 85 years of age and older), is one component of a comprehensive surveillance program. It is not intended to diagnose infection nor to guide or monitor treatment. Performed at [facility], [address],  [place], [place] [address]      Radiology Studies:  DG Chest Port 1 View  Result Date: 03/28/2020 CLINICAL DATA:  Cough EXAM: PORTABLE CHEST 1 VIEW COMPARISON:  March 21, 2020 FINDINGS: The mediastinal contour and cardiac silhouette are stable. Cardiac valvular replacement ring is identified unchanged. Increased pulmonary interstitium is identified bilaterally with enlarged central pulmonary vessel caliber. There are small bilateral pleural effusions. The bony structures are stable. IMPRESSION: Mild pulmonary edema. Small bilateral pleural effusions. Electronically Signed   By: Abelardo Diesel M.D.   On: 03/28/2020 09:46     Scheduled Meds:   . apixaban  2.5 mg Oral BID  . calcium acetate  667 mg Oral BID WC  . Chlorhexidine Gluconate Cloth  6 each Topical Daily  . docusate sodium  100 mg Oral BID  . feeding supplement (ENSURE ENLIVE)  237 mL Oral BID BM  . furosemide  20 mg Intravenous Once  . levothyroxine  75 mcg Oral Q0600  . metoprolol succinate  25 mg Oral Daily  . polyethylene glycol  17 g Oral BID  . senna-docusate  2 tablet Oral Daily    Continuous Infusions:   . methocarbamol (ROBAXIN) IV       LOS: 7 days     Vernell Leep, MD, [place], [facility]. [facility]    To contact the attending provider between 7A-7P or the covering provider during after hours 7P-7A, please log into the web site [email or web address] and access using universal [place] password for that web site. If you do not have the password, please call the hospital operator.  03/28/2020, 11:01 AM

## 2020-03-28 NOTE — Progress Notes (Addendum)
Physical Therapy Treatment Patient Details Name: Rebecca Cruz MRN: [id number] DOB: [birth year] Today's Date: 03/28/2020    History of Present Illness Pt is a 86 y.o. female presenting to hospital 6/27 s/p fall (tripped and fell last night) sustaining R hip pain.  Imaging showing: "Displaced comminuted fracture through the right femoral intertrochanteric region. A nondisplaced fracture through the right inferior pubic ramus is not excluded."  Per MD Posey Pronto note (ortho): "We will additionally plan to treat possible right inferior pubic ramus fracture non-operatively as there are no other signs of significant pelvic ring injury."  Pt s/p IM nailing of R femur with cephalomedullary device 6/28 secondary R intertrochanteric hip fx.  Significant ecchymosis noted post-op; medical staff aware and monitoring. PMH includes htn, thyroid disease, aortic valve repair, appendectomy, abdominal hysterectomy, stage IV CKD, a-fib.    PT Comments    Patient sleeping upon arrival to room; awakens briefly, but difficulty maintaining alertness for full, active participation with session.  Answers question at times, but unable to maintain full conversation (unable to sustain topic, also drifting to sleep)  As result, session limited to R LE supine therex, requiring act assist for movement in all planes.  Generally guarded with all movement; constant cuing for alertness and active participation with therex as able.  R LE remains generally edematous with significant echhymosis to medial/posterior thigh/knee Will continue to progress mobility next session as medically appropriate.  If participation remains limited, may consider transition from BID to QD.    Follow Up Recommendations  SNF     Equipment Recommendations  Rolling walker with 5" wheels;3in1 (PT)    Recommendations for Other Services       Precautions / Restrictions Precautions Precautions: Fall Restrictions Weight Bearing Restrictions: Yes RLE  Weight Bearing: Weight bearing as tolerated    Mobility  Bed Mobility               General bed mobility comments: deferred due to lethargy  Transfers                 General transfer comment: deferred due to lethargy  Ambulation/Gait             General Gait Details: deferred due to lethargy   Stairs             Wheelchair Mobility    Modified Rankin (Stroke Patients Only)       Balance                                            Cognition Arousal/Alertness: Lethargic Behavior During Therapy: Flat affect                                   General Comments: sleeping upon arrival to room; awakens briefly, but difficulty maintaining alertness for full, active participation with session.  Answers question at times, but unable to maintain full conversation (unable to sustain topic, also drifting to sleep)      Exercises Other Exercises Other Exercises: R LE supine therex, 2x15, act assist ROM: ankle pumps, SAQs, heel slides, hip abduct/adduct.  Generally guarded with all movement; constant cuing for alertness and active participation with therex as able.  R LE remains generally edematous with significant echhymosis to medial/posterior thigh/knee    General Comments  Pertinent Vitals/Pain Pain Assessment: Faces Faces Pain Scale: Hurts even more Pain Location: R hip/thigh Pain Descriptors / Indicators: Aching;Sore;Tender;Grimacing Pain Intervention(s): Limited activity within patient's tolerance;Monitored during session;Repositioned    Home Living                      Prior Function            PT Goals (current goals can now be found in the care plan section) Acute Rehab PT Goals Patient Stated Goal: To be able to move better PT Goal Formulation: With patient Time For Goal Achievement: 04/06/20 Potential to Achieve Goals: Fair Progress towards PT goals: Not progressing toward goals -  comment (limited by fatigue/lethargy this date)    Frequency    BID      PT Plan Current plan remains appropriate    Co-evaluation              AM-PAC PT "6 Clicks" Mobility   Outcome Measure  Help needed turning from your back to your side while in a flat bed without using bedrails?: A Lot Help needed moving from lying on your back to sitting on the side of a flat bed without using bedrails?: A Lot Help needed moving to and from a bed to a chair (including a wheelchair)?: Total Help needed standing up from a chair using your arms (e.g., wheelchair or bedside chair)?: Total Help needed to walk in hospital room?: Total Help needed climbing 3-5 steps with a railing? : Total 6 Click Score: 8    End of Session   Activity Tolerance: Patient limited by fatigue;Patient limited by pain Patient left: in bed;with call bell/phone within reach;with bed alarm set   PT Visit Diagnosis: Other abnormalities of gait and mobility (R26.89);Muscle weakness (generalized) (M62.81);History of falling (Z91.81);Difficulty in walking, not elsewhere classified (R26.2);Pain Pain - part of body: Hip     Time: 6283-6629 PT Time Calculation (min) (ACUTE ONLY): 15 min  Charges:  $Therapeutic Exercise: 8-22 mins                     Courtnee Myer H. Owens Shark, PT, DPT, NCS 03/28/20, 4:38 PM [phone]

## 2020-03-28 NOTE — Progress Notes (Signed)
PT Cancellation Note  Patient Details Name: Rebecca Cruz MRN: [id number] DOB: 1924/05/22   Cancelled Treatment:    Reason Eval/Treat Not Completed:  (Chart reviewed for planned evaluation session.  Per discussion with primary RN, patient with mild pulmonary edema; preparing to admin lasix and pain medications.  Recommends hold at this time with re-attempt at later time/date as medically appropriate and available.)  Davyn Morandi H. Owens Shark, PT, DPT, NCS 03/28/20, 12:13 PM [phone]

## 2020-03-28 NOTE — Progress Notes (Addendum)
  Subjective: 6 Days Post-Op Procedure(s) (LRB): INTRAMEDULLARY (IM) NAIL INTERTROCHANTRIC (Right) Patient reports pain as moderate.   Plan is to go Skilled nursing facility after hospital stay. Negative for chest pain and shortness of breath Fever: no Gastrointestinal: negative for nausea and vomiting.   Patient has not had a bowel movement.  Objective: Vital signs in last 24 hours: Temp:  [97.9 F (36.6 C)-98.4 F (36.9 C)] 98.4 F (36.9 C) (07/04 0737) Pulse Rate:  [71-86] 86 (07/04 0737) Resp:  [16-18] 16 (07/04 0737) BP: (111-121)/(59-66) 121/66 (07/04 0737) SpO2:  [93 %-95 %] 93 % (07/04 0737)  Intake/Output from previous day:  Intake/Output Summary (Last 24 hours) at 03/28/2020 1207 Last data filed at 03/28/2020 0523 Gross per 24 hour  Intake 682.13 ml  Output --  Net 682.13 ml    Intake/Output this shift: No intake/output data recorded.  Labs: Recent Labs    03/25/20 1501 03/26/20 0632 03/27/20 0344 03/28/20 0459  HGB 8.2* 8.2* 8.5* 8.4*   Recent Labs    03/27/20 0344 03/28/20 0459  WBC 30.9* 27.6*  RBC 2.72* 2.75*  HCT 26.5* 28.2*  PLT 361 360   Recent Labs    03/28/20 0459 03/28/20 0749  NA 139 139  K 5.7* 5.0  CL 110 109  CO2 24 22  BUN 70* 70*  CREATININE 1.37* 1.32*  GLUCOSE 98 103*  CALCIUM 8.1* 8.2*   No results for input(s): LABPT, INR in the last 72 hours.   EXAM General - Patient is Appropriate, Oriented and somnolent Extremity - Neurovascular intact Dorsiflexion/Plantar flexion intact Compartment soft; previously noted ecchymosis over medial aspect of R thigh to groin area remains unchanged. Dressing/Incision -Compression dressing in place. Following dressing removal, mild sanguinous drainage noted on ABDs.  Minimal active drainage noted from incisions, with only distal most incision showing some minimal serous drainage.  Motor Function - intact, moving foot and toes well on exam.     Assessment/Plan: 6 Days Post-Op  Procedure(s) (LRB): INTRAMEDULLARY (IM) NAIL INTERTROCHANTRIC (Right) Principal Problem:   Intertrochanteric fracture of right femur (HCC) Active Problems:   Hypertension   Thyroid disease   Atrial fibrillation with RVR (HCC)   Intertrochanteric fracture of right femur, closed, initial encounter (HCC)   Chronic kidney disease, stage 4 (severe) (HCC)   Protein-calorie malnutrition, severe   Leukocytosis  Estimated body mass index is 22.19 kg/m as calculated from the following:   Height as of this encounter: 5' (1.524 m).   Weight as of this encounter: 51.5 kg. Advance diet Up with therapy  Fresh compression dressing applied with help from NTs.  DVT Prophylaxis - Eloquis Weight-Bearing as tolerated to right leg  Cassell Smiles, PA-C [facility] 03/28/2020, 12:07 PM

## 2020-03-28 NOTE — Progress Notes (Signed)
CH encountered pt.'s niece Tye [place] while passing through 1A; niece conversational and upbeat --> asked [facility] to pay visit to her aunt.  Pt. lying down in bed, awake and amenable to visit, but slightly agitated due to wanting to be repositioned when [facility] I entered.  Niece shared pt. lives in [place] w/her son but has been in [place] visiting niece; pt. fell while getting ready for church last Sunday and was brought to [facility].  Pt. appears to be coping effectively and seems well supported by niece; pt. was reportedly mobile w/walker before accident and pt. hopes to be able to regain mobility despite injury from fall.  Pt. and niece said plan is to go to rehab back in [place] after discharge.  Pt. repositioned by RN after [facility] showed pt. how to use call button.  No further needs expressed at this time.    03/28/20 1400  Clinical Encounter Type  Visited With Patient and family together;Family  Visit Type Initial;Psychological support;Social support  Referral From Family  Consult/Referral To Chaplain  Spiritual Encounters  Spiritual Needs Emotional  Stress Factors  Patient Stress Factors Loss of control;Health changes  Family Stress Factors Health changes

## 2020-03-28 NOTE — Plan of Care (Signed)
°  Problem: Coping: °Goal: Level of anxiety will decrease °Outcome: Progressing °  °

## 2020-03-28 NOTE — Progress Notes (Signed)
Pt unable to void after using bedpan, bladder scan >999. MD Hongalgi notified, orders received for in and out X1.   In and out output 1050 ml. [place] Notified.

## 2020-03-29 DIAGNOSIS — D6489 Other specified anemias: Secondary | ICD-10-CM

## 2020-03-29 DIAGNOSIS — I4891 Unspecified atrial fibrillation: Secondary | ICD-10-CM

## 2020-03-29 DIAGNOSIS — I5043 Acute on chronic combined systolic (congestive) and diastolic (congestive) heart failure: Secondary | ICD-10-CM

## 2020-03-29 LAB — CBC
HCT: 28.4 % — ABNORMAL LOW (ref 36.0–46.0)
Hemoglobin: 8.8 g/dL — ABNORMAL LOW (ref 12.0–15.0)
MCH: 30.3 pg (ref 26.0–34.0)
MCHC: 31 g/dL (ref 30.0–36.0)
MCV: 97.9 fL (ref 80.0–100.0)
Platelets: 376 10*3/uL (ref 150–400)
RBC: 2.9 MIL/uL — ABNORMAL LOW (ref 3.87–5.11)
RDW: 18.3 % — ABNORMAL HIGH (ref 11.5–15.5)
WBC: 29.2 10*3/uL — ABNORMAL HIGH (ref 4.0–10.5)
nRBC: 6.3 % — ABNORMAL HIGH (ref 0.0–0.2)

## 2020-03-29 LAB — TROPONIN I (HIGH SENSITIVITY)
Troponin I (High Sensitivity): 458 ng/L (ref <18)
Troponin I (High Sensitivity): 517 ng/L (ref <18)
Troponin I (High Sensitivity): 506 ng/L (ref <18)

## 2020-03-29 LAB — BASIC METABOLIC PANEL
Anion gap: 6 (ref 5–15)
BUN: 75 mg/dL — ABNORMAL HIGH (ref 8–23)
CO2: 24 mmol/L (ref 22–32)
Calcium: 7.9 mg/dL — ABNORMAL LOW (ref 8.9–10.3)
Chloride: 108 mmol/L (ref 98–111)
Creatinine, Ser: 1.5 mg/dL — ABNORMAL HIGH (ref 0.44–1.00)
GFR calc Af Amer: 34 mL/min — ABNORMAL LOW (ref >60)
GFR calc non Af Amer: 30 mL/min — ABNORMAL LOW (ref >60)
Glucose, Bld: 101 mg/dL — ABNORMAL HIGH (ref 70–99)
Potassium: 5 mmol/L (ref 3.5–5.1)
Sodium: 138 mmol/L (ref 135–145)

## 2020-03-29 LAB — BRAIN NATRIURETIC PEPTIDE: B Natriuretic Peptide: 1611 pg/mL — ABNORMAL HIGH (ref 0.0–100.0)

## 2020-03-29 LAB — ALBUMIN: Albumin: 2.5 g/dL — ABNORMAL LOW (ref 3.5–5.0)

## 2020-03-29 MED ORDER — ALBUMIN HUMAN 25 % IV SOLN
12.5000 g | Freq: Two times a day (BID) | INTRAVENOUS | Status: DC
  Administered 2020-03-29 – 2020-04-02 (×9): 12.5 g via INTRAVENOUS
  Filled 2020-03-29 (×10): qty 50

## 2020-03-29 MED ORDER — FUROSEMIDE 10 MG/ML IJ SOLN
40.0000 mg | Freq: Once | INTRAMUSCULAR | Status: AC
  Administered 2020-03-29: 40 mg via INTRAVENOUS
  Filled 2020-03-29: qty 4

## 2020-03-29 NOTE — Progress Notes (Signed)
PROGRESS NOTE   Rebecca Cruz  XFG:[id number]    DOB: 09-Sep-1924    DOA: 03/21/2020  PCP: Patient, No Pcp Per   I have briefly reviewed patients previous medical records in [facility].  Chief Complaint  Patient presents with  . Fall    Brief Narrative:  86 year old female with PMH of hypertension, hypothyroidism, stage IV chronic kidney disease, atrial fibrillation on chronic anticoagulation, originally from [place], visiting family in the [place] area, presented to ED via EMS for evaluation of right hip pain.  S/p IM intertrochanteric nail on 6/28 for intertrochanteric fracture of right femur.  Hospital course complicated by ecchymosis of right medial thigh possibly related to surgical manipulation, some acute blood loss anemia, acute kidney injury, acute urinary retention, AMS and overall slow progress.  Hematology consulted for leukocytosis.  RLE ecchymosis, ABLA stabilized.  Creatinine fluctuating?  Element of cardiorenal syndrome.  Mild CHF/coughing-IV Lasix.  Urinary retention recurrent, Foley catheter placed back.  AMS better but not resolved.  Nephrology consulted due to acute kidney injury and cardiology for acute CHF.   Assessment & Plan:  Principal Problem:   Intertrochanteric fracture of right femur [facility]) Active Problems:   Hypertension   Thyroid disease   Atrial fibrillation with RVR (HCC)   Intertrochanteric fracture of right femur, closed, initial encounter ([place])   Chronic kidney disease, stage 4 (severe) (HCC)   Protein-calorie malnutrition, severe   Leukocytosis   Intertrochanteric fracture of right femur, s/p IM nail 6/28.  Sustained status post fall and had a displaced comminuted fracture.  Orthopedics following. WBAT to right lower extremity.  Although she may follow-up with [facility] orthopedics in 2 weeks for staple removal, since she is going to SNF in [place], will need to set up follow-up with orthopedics MD locally.  Hospital course  complicated by extensive right medial thigh bruising extending from mons pubis/groin mostly up to the back of the right knee in a bandlike fashion, some of it tracking faintly to the right ankle.  Stable over the last couple of days.  Hemoglobin has remained stable. Per orthopedics, this was possibly related to some manipulation of the medial aspect of the extremity during surgery which may have precipitated some bleeding.  Eliquis temporarily held for about 48 hours, resumed 7/3.  Pain management-judicious use of opioids.  Continue mobilizing with therapies.  As per communication with orthopedics on 7/3, no concern for infection at operative site.  Acute possibly diastolic CHF: As noted clinically and by chest x-ray on 7/3.  DC IV fluids.  Lasix 20 mg IV x1 dose/4.  TTE in care everywhere on 10/04/2018: LVEF 70+%, mild LVH.  Possible LV relaxation abnormality.  Moderate to severe TR.  Pulmonary hypertension.  PFO/ASD with left-to-right shunting.  Repeat TTE 7/4-yet to be read.  Due to ongoing volume overload, worsening AKI, cardiology consulted for assistance.  Nephrology has initiated IV albumin due to suspected third spacing in the context of low albumin to try and mobilize fluid.  May still need some IV Lasix.  BNP 1611  Elevated troponin: 458 on 7/5.  No anginal symptoms.  Cardiology on board and plan to follow trend.  Indicated that they wish to avoid any interventions and I agree given her very advanced age and frail status.  AKI complicating stage IV chronic kidney disease: Creatinine 0.9 on admission.  Since admission creatinine has been fluctuating up and down.  May be multifactorial due to fluctuating and poor oral intake, mild rhabdo and cardiorenal.  Briefly  hydrated with IV fluids then stopped due to volume overload.  CK 487.  Renal ultrasound without hydronephrosis.  Received IV Lasix 20 yesterday and creatinine peaked to 1.5 today.  Nephrology consulted and input appreciated and suspect AKI due  to volume shifts and are giving IV albumin.  Follow BMP  Permanent atrial fibrillation: Continue reduced dose of Toprol-XL and Eliquis resumed after temporary hold for 48 hours.  No overt bleeding.  Essential hypertension: Losartan held due to acute kidney injury.  Blood pressures have improved after cutting back Toprol-XL from 50 to 25 mg daily.  Hypothyroid: Continue Synthroid.  Postop acute blood loss anemia: Hemoglobin down from 11.2-8.1.  Hemoglobin has been stable in the 8 g range for several days now.  Continue to follow CBCs and transfuse if hemoglobin 7 g or less.  Leukocytosis: WBC 18 on presentation which progressively increased, 22.3 > 26.7 > 32.6.?  Stress response.  No overt features of infectious cause.  As per family's report to prior MD, has history of elevated WBC.  Holding antibiotics.  However review in care everywhere shows WBC in the 11-12 range in 2019 and 2020.  Appreciate hematology consultation and they do suspect that it is likely reactive.  Peripheral smear without concerning features.  Flow cytometry pending.  WBC up again to 31K.  Urine microscopy without UTI features.  Chest x-ray 7/4 suggestive of pulmonary edema, no pneumonia noted.  Acute urinary retention, recurrent: After 2 attempts of in and out catheterization last night, eventually had Foley catheter placed 7/1.  Foley catheter discontinued on 7/3.  But developed recurrent urinary retention and Foley catheter placed back 7/5.  Acute metabolic encephalopathy: Likely multifactorial related to pain, pain medicines, hospitalization in a very elderly female patient who may have some underlying cognitive impairment.  Supportive care, minimize opioids, redirect.  Urine microscopy 7/1 without bacteria or nitrites.  Some pyuria.  Mental status has slowly improved but do not think she is back to baseline, even compared to last Wednesday  Body mass index is 22.19 kg/m.  Nutritional Status Nutrition Problem: Severe  Malnutrition Etiology: chronic illness (CKD) Signs/Symptoms: severe fat depletion, severe muscle depletion Interventions: Refer to RD note for recommendations   Adult failure to thrive: Multifactorial due to advanced age and multiple severe acute and chronic comorbidities as above.  Discussed in detail with son and updated current status and how ill she is.  Palliative care medicine consulted for goals of care.  DVT prophylaxis: Eliquis Code Status: DNR Family Communication: I discussed in detail with patient's son via phone on 7/5, updated care and answered all questions.   Disposition:  Status is: Inpatient  Remains inpatient appropriate because:Inpatient level of care appropriate due to severity of illness   Dispo: The patient is from: Home              Anticipated d/c is to: SNF              Anticipated d/c date is: 3 days.  Likely early next week              Patient currently is not medically stable to d/c.        Consultants:   Orthopedic Hematology Nephrology Cardiology Palliative  Procedures:   S/p right hip fracture IM nail 6/28 Foley catheter 7/1-7/3, 7/5 greater than  Antimicrobials:   None   Subjective:  Patient examined along with her female RN at bedside.  States that she feels miserable being in bed.  Wants to  get up to the chair.  Appears still slightly confused.  Had BMs yesterday and today.  Unable to urinate, bladder scan greater than 600, RN to insert Foley again.  As per discussion with PT, max assist and will need left to get her up to the chair.  Objective:   Vitals:   03/28/20 2324 03/29/20 0733 03/29/20 1518 03/29/20 1650  BP: 125/66 (!) 118/58 128/67 121/76  Pulse: 82 81 77   Resp: 16 19 18    Temp: (!) 97.4 F (36.3 C) 97.7 F (36.5 C) 97.8 F (36.6 C)   TempSrc: Oral Oral Oral   SpO2: 93% 95% 97% 95%  Weight:      Height:        General exam: Elderly female, moderately built and frail, lying propped up in bed.  Oral mucosa  moist.  Looks uncomfortable in bed. Respiratory system: Occasional bibasal crackles but otherwise clear to auscultation.  No increased work of breathing. Cardiovascular system: S1 & S2 heard, irregularly irregular. No JVD, rubs, gallops or clicks.  2/6 systolic murmur heard at apex.  Mostly right thigh edema, may be slightly better today.  Trace bilateral ankle edema. Gastrointestinal system: Abdomen nondistended, nontender and soft. No organomegaly or masses appreciated.  Normal bowel sounds heard.. Central nervous system: Alert and oriented x self and partly to place. No focal neurological deficits. Extremities: Symmetric 5 x 5 power.  Right hip surgical site dressing clean and dry. Noted some swelling over the medial aspect of the right thigh with a band of bruising extending from the right groin/mons pubis to the right popliteal fossa, and faint tracking of this bruising down to right ankle-no significant progression over the last 3 to 4 days. Skin: No rashes, lesions or ulcers Psychiatry: Judgement and insight impaired. Mood & affect somewhat confused.     Data Reviewed:   I have personally reviewed following labs and imaging studies   CBC: Recent Labs  Lab 03/25/20 0456 03/25/20 1501 03/27/20 0344 03/28/20 0459 03/29/20 0454  WBC 27.8*   < > 30.9* 27.6* 29.2*  NEUTROABS 20.0*  --   --   --   --   HGB 8.6*   < > 8.5* 8.4* 8.8*  HCT 27.6*   < > 26.5* 28.2* 28.4*  MCV 100.7*   < > 97.4 102.5* 97.9  PLT 375   < > 361 360 376   < > = values in this interval not displayed.    Basic Metabolic Panel: Recent Labs  Lab 03/23/20 0345 03/23/20 0345 03/24/20 0345 03/24/20 0345 03/25/20 0456 03/26/20 6767 03/28/20 0459 03/28/20 0749 03/29/20 0454  NA 137   < > 138   < > 139   < > 139 139 138  K 4.6   < > 4.5   < > 4.2   < > 5.7* 5.0 5.0  CL 105   < > 108   < > 109   < > 110 109 108  CO2 25   < > 23   < > 22   < > 24 22 24   GLUCOSE 174*   < > 119*   < > 106*   < > 98 103* 101*   BUN 51*   < > 58*   < > 55*   < > 70* 70* 75*  CREATININE 1.42*   < > 1.27*   < > 1.14*   < > 1.37* 1.32* 1.50*  CALCIUM 8.0*   < > 7.7*   < >  7.8*   < > 8.1* 8.2* 7.9*  MG 1.8  --  2.0  --  2.0  --   --   --   --    < > = values in this interval not displayed.    Liver Function Tests: Recent Labs  Lab 03/29/20 0454  ALBUMIN 2.5*    CBG: No results for input(s): GLUCAP in the last 168 hours.  Microbiology Studies:   Recent Results (from the past 240 hour(s))  SARS Coronavirus 2 by RT PCR (hospital order, performed in [facility] hospital lab) Nasopharyngeal Nasopharyngeal Swab     Status: None   Collection Time: 03/21/20  8:06 AM   Specimen: Nasopharyngeal Swab  Result Value Ref Range Status   SARS Coronavirus 2 NEGATIVE NEGATIVE Final    Comment: (NOTE) SARS-CoV-2 target nucleic acids are NOT DETECTED.  The SARS-CoV-2 RNA is generally detectable in upper and lower respiratory specimens during the acute phase of infection. The lowest concentration of SARS-CoV-2 viral copies this assay can detect is 250 copies / mL. A negative result does not preclude SARS-CoV-2 infection and should not be used as the sole basis for treatment or other patient management decisions.  A negative result may occur with improper specimen collection / handling, submission of specimen other than nasopharyngeal swab, presence of viral mutation(s) within the areas targeted by this assay, and inadequate number of viral copies (<250 copies / mL). A negative result must be combined with clinical observations, patient history, and epidemiological information.  Fact Sheet for Patients:   StrictlyIdeas.no  Fact Sheet for Healthcare Providers: BankingDealers.co.za  This test is not yet approved or  cleared by the [place] FDA and has been authorized for detection and/or diagnosis of SARS-CoV-2 by FDA under an Emergency Use Authorization (EUA).  This  EUA will remain in effect (meaning this test can be used) for the duration of the COVID-19 declaration under Section 564(b)(1) of the Act, 21 U.S.C. section 360bbb-3(b)(1), unless the authorization is terminated or revoked sooner.  Performed at [facility], [address] [place], [place] [address]   Surgical PCR screen     Status: None   Collection Time: 03/21/20 12:15 PM   Specimen: Nasal Mucosa; Nasal Swab  Result Value Ref Range Status   MRSA, PCR NEGATIVE NEGATIVE Final   Staphylococcus aureus NEGATIVE NEGATIVE Final    Comment: (NOTE) The Xpert SA Assay (FDA approved for NASAL specimens in patients 80 years of age and older), is one component of a comprehensive surveillance program. It is not intended to diagnose infection nor to guide or monitor treatment. Performed at [facility], [address] [place], [place] [address]      Radiology Studies:  US RENAL  Result Date: 26-Apr-2020 CLINICAL DATA:  Acute renal insufficiency. EXAM: RENAL / URINARY TRACT ULTRASOUND COMPLETE COMPARISON:  None. FINDINGS: Right Kidney: Renal measurements: 9.5 x 3.9 x 5.0 cm = volume: 97 mL. Slight increased cortical echogenicity. No mass or hydronephrosis visualized. Left Kidney: Renal measurements: 7.8 x 4.7 x 4.6 cm = volume: 89 mL. Echogenicity within normal limits. No mass or hydronephrosis visualized. Bladder: Appears normal for degree of bladder distention. Prevoid volume 413 mL. Other: Small bilateral pleural effusions. IMPRESSION: 1. Kidneys at the lower limits of normal in size left smaller than right. No evidence of hydronephrosis. Slight increased right renal cortical echogenicity which can be seen in medical renal disease. 2.  Small bilateral pleural effusions. Electronically Signed   By: Marin Olp  M.D.   On: 03/28/2020 11:50   DG Chest Port 1 View  Result Date: 03/28/2020 CLINICAL DATA:  Cough EXAM: PORTABLE CHEST 1 VIEW COMPARISON:  March 21, 2020 FINDINGS: The  mediastinal contour and cardiac silhouette are stable. Cardiac valvular replacement ring is identified unchanged. Increased pulmonary interstitium is identified bilaterally with enlarged central pulmonary vessel caliber. There are small bilateral pleural effusions. The bony structures are stable. IMPRESSION: Mild pulmonary edema. Small bilateral pleural effusions. Electronically Signed   By: Abelardo Diesel M.D.   On: 03/28/2020 09:46     Scheduled Meds:   . apixaban  2.5 mg Oral BID  . calcium acetate  667 mg Oral BID WC  . Chlorhexidine Gluconate Cloth  6 each Topical Daily  . docusate sodium  100 mg Oral BID  . feeding supplement (ENSURE ENLIVE)  237 mL Oral BID BM  . levothyroxine  75 mcg Oral Q0600  . metoprolol succinate  25 mg Oral Daily  . polyethylene glycol  17 g Oral BID  . senna-docusate  2 tablet Oral Daily    Continuous Infusions:   . albumin human 12.5 g (03/29/20 1531)  . methocarbamol (ROBAXIN) IV       LOS: 8 days     Vernell Leep, MD, [place], [facility]. [facility]    To contact the attending provider between 7A-7P or the covering provider during after hours 7P-7A, please log into the web site [email or web address] and access using universal [employer] password for that web site. If you do not have the password, please call the hospital operator.  03/29/2020, 6:22 PM

## 2020-03-29 NOTE — Progress Notes (Signed)
Chart reviewed. Pt on bedside comode. Reattempt later today.

## 2020-03-29 NOTE — Consult Note (Signed)
[address] [place], [place] [address] Phone [phone]. Fax [phone]  Date: 03/29/2020                  Patient Name:  Monzerat Handler  MRN: [id number]  DOB: 1923/11/05  Age / Sex: 86 y.o., female         PCP: Patient, No Pcp Per                 Service Requesting Consult: IM/ Modena Jansky, MD                 Reason for Consult: ARF            History of Present Illness: Patient is a 86 y.o. female with medical problems of hypertension, who was admitted to [facility] on 03/21/2020 for evaluation of right hip pain after fall.  She was diagnosed with a right hip fracture and underwent intramedullary nailing of right femur on June 28. Nephrology consult has been requested for rising creatinine as well as fluid overload   Medications: Outpatient medications: Medications Prior to Admission  Medication Sig Dispense Refill Last Dose  . acetaminophen (TYLENOL) 500 MG tablet Take 500 mg by mouth every 6 (six) hours as needed for headache.   03/20/2020 at 1930  . calcium acetate (PHOSLO) 667 MG capsule Take 667 mg by mouth 2 (two) times daily.   03/20/2020 at 1800  . cetirizine (ZYRTEC) 10 MG tablet Take 10 mg by mouth daily at 6 (six) AM.   03/20/2020 at 0600  . ELIQUIS 2.5 MG TABS tablet Take 2.5 mg by mouth 2 (two) times daily.   03/20/2020 at 1900  . levothyroxine (SYNTHROID) 75 MCG tablet Take 75 mcg by mouth daily.   03/21/2020 at 0630  . losartan (COZAAR) 100 MG tablet Take 100 mg by mouth daily.   03/20/2020 at 0730  . metoprolol succinate (TOPROL-XL) 50 MG 24 hr tablet Take 50 mg by mouth daily.   03/20/2020 at 0730  . Multiple Vitamin (MULTIVITAMIN) capsule Take 1 capsule by mouth daily.   03/20/2020 at 0730    Current medications: Current Facility-Administered Medications  Medication Dose Route Frequency Provider Last Rate Last Admin  . acetaminophen (TYLENOL) tablet 650 mg  650 mg Oral Q6H PRN Modena Jansky, MD   650 mg at 03/28/20 2355  . apixaban  (ELIQUIS) tablet 2.5 mg  2.5 mg Oral BID Modena Jansky, MD   2.5 mg at 03/28/20 2055  . bisacodyl (DULCOLAX) suppository 10 mg  10 mg Rectal Daily PRN Leim Fabry, MD   10 mg at 03/25/20 1000  . calcium acetate (PHOSLO) capsule 667 mg  667 mg Oral BID WC Leim Fabry, MD   667 mg at 03/28/20 1727  . Chlorhexidine Gluconate Cloth 2 % PADS 6 each  6 each Topical Daily Enzo Bi, MD   6 each at 03/26/20 1119  . docusate sodium (COLACE) capsule 100 mg  100 mg Oral BID Leim Fabry, MD   100 mg at 03/28/20 2056  . feeding supplement (ENSURE ENLIVE) (ENSURE ENLIVE) liquid 237 mL  237 mL Oral BID BM Enzo Bi, MD   237 mL at 03/28/20 0930  . HYDROmorphone (DILAUDID) injection 0.5 mg  0.5 mg Intravenous Q8H PRN Modena Jansky, MD   0.5 mg at 03/28/20 0148  . levothyroxine (SYNTHROID) tablet 75 mcg  75 mcg Oral Q0600 Leim Fabry, MD   75 mcg at 03/29/20 0503  . methocarbamol (ROBAXIN) tablet 500 mg  500 mg Oral Q6H PRN Leim Fabry, MD   500 mg at 03/25/20 0454   Or  . methocarbamol (ROBAXIN) 500 mg in dextrose 5 % 50 mL IVPB  500 mg Intravenous Q6H PRN Leim Fabry, MD      . metoprolol succinate (TOPROL-XL) 24 hr tablet 25 mg  25 mg Oral Daily Modena Jansky, MD   25 mg at 03/28/20 1245  . ondansetron (ZOFRAN) tablet 4 mg  4 mg Oral Q6H PRN Leim Fabry, MD       Or  . ondansetron [facility]) injection 4 mg  4 mg Intravenous Q6H PRN Leim Fabry, MD   4 mg at 03/27/20 2111  . polyethylene glycol (MIRALAX / GLYCOLAX) packet 17 g  17 g Oral BID Modena Jansky, MD   17 g at 03/28/20 2105  . senna-docusate (Senokot-S) tablet 2 tablet  2 tablet Oral Daily Modena Jansky, MD   2 tablet at 03/28/20 [id number]  . sodium phosphate (FLEET) 7-19 GM/118ML enema 1 enema  1 enema Rectal Once PRN Leim Fabry, MD      . traMADol Veatrice Bourbon) tablet 50 mg  50 mg Oral Q12H PRN Modena Jansky, MD   50 mg at 03/28/20 2356      Allergies: Allergies  Allergen Reactions  . Codeine     With codeine      Past  Medical History: Past Medical History:  Diagnosis Date  . Hypertension   . Thyroid disease      Past Surgical History: Past Surgical History:  Procedure Laterality Date  . ABDOMINAL HYSTERECTOMY    . AORTIC VALVE REPAIR    . APPENDECTOMY    . INTRAMEDULLARY (IM) NAIL INTERTROCHANTERIC Right 03/22/2020   Procedure: INTRAMEDULLARY (IM) NAIL INTERTROCHANTRIC;  Surgeon: Leim Fabry, MD;  Location: [facility] ORS;  Service: Orthopedics;  Laterality: Right;     Family History: History reviewed. No pertinent family history.   Social History: Social History   Socioeconomic History  . Marital status: Significant Other    Spouse name: Not on file  . Number of children: Not on file  . Years of education: Not on file  . Highest education level: Not on file  Occupational History  . Not on file  Tobacco Use  . Smoking status: Never Smoker  . Smokeless tobacco: Never Used  Substance and Sexual Activity  . Alcohol use: Yes    Comment: once a week   . Drug use: Not on file  . Sexual activity: Not on file  Other Topics Concern  . Not on file  Social History Narrative  . Not on file   Social Determinants of Health   Financial Resource Strain:   . Difficulty of Paying Living Expenses:   Food Insecurity:   . Worried About Charity fundraiser in the Last Year:   . Arboriculturist in the Last Year:   Transportation Needs:   . Film/video editor (Medical):   Marland Kitchen Lack of Transportation (Non-Medical):   Physical Activity:   . Days of Exercise per Week:   . Minutes of Exercise per Session:   Stress:   . Feeling of Stress :   Social Connections:   . Frequency of Communication with Friends and Family:   . Frequency of Social Gatherings with Friends and Family:   . Attends Religious Services:   . Active Member of Clubs or Organizations:   . Attends Archivist Meetings:   Marland Kitchen Marital Status:  Intimate Partner Violence:   . Fear of Current or Ex-Partner:   . Emotionally  Abused:   Marland Kitchen Physically Abused:   . Sexually Abused:      Review of Systems: Gen: No fevers or chills HEENT: No vision complaints.  Somewhat decreased hearing CV: No chest pain or shortness of breath Resp: No cough or sputum production GI: Appetite is low.  She has difficulty eating because of concurrent illness GU : Difficulty voiding because of bed rest. MS: Right hip fracture status post surgery Derm:    No complaints Psych: No complaints Heme: No complaints Neuro: No complaints Endocrine.  No complaints  Vital Signs: Blood pressure (!) 118/58, pulse 81, temperature 97.7 F (36.5 C), temperature source Oral, resp. rate 19, height 5' (1.524 m), weight 51.5 kg, SpO2 95 %.   Intake/Output Summary (Last 24 hours) at 03/29/2020 1001 Last data filed at 03/28/2020 1542 Gross per 24 hour  Intake --  Output 1050 ml  Net -1050 ml    Weight trends: [employer]   03/21/20 6073  Weight: 51.5 kg    Physical Exam: General:  Frail, elderly woman, laying in the bed  HEENT  dry oral mucous membranes  Neck:  No JVD  Lungs:  Normal breathing effort on room air  Heart::  No rub  Abdomen:  Soft, nontender, nondistended  Extremities:  Some dependent edema on the thighs and arms  Neurologic:  Alert, able to answer few simple questions  Skin:  Scattered ecchymosis  Access:   Foley:        Lab results: Basic Metabolic Panel: Recent Labs  Lab 03/23/20 0345 03/23/20 0345 03/24/20 0345 03/24/20 0345 03/25/20 0456 03/26/20 7106 03/28/20 0459 03/28/20 0749 03/29/20 0454  NA 137   < > 138   < > 139   < > 139 139 138  K 4.6   < > 4.5   < > 4.2   < > 5.7* 5.0 5.0  CL 105   < > 108   < > 109   < > 110 109 108  CO2 25   < > 23   < > 22   < > 24 22 24   GLUCOSE 174*   < > 119*   < > 106*   < > 98 103* 101*  BUN 51*   < > 58*   < > 55*   < > 70* 70* 75*  CREATININE 1.42*   < > 1.27*   < > 1.14*   < > 1.37* 1.32* 1.50*  CALCIUM 8.0*   < > 7.7*   < > 7.8*   < > 8.1* 8.2* 7.9*  MG  1.8  --  2.0  --  2.0  --   --   --   --    < > = values in this interval not displayed.    Liver Function Tests: No results for input(s): AST, ALT, ALKPHOS, BILITOT, PROT, ALBUMIN in the last 168 hours. No results for input(s): LIPASE, AMYLASE in the last 168 hours. No results for input(s): AMMONIA in the last 168 hours.  CBC: Recent Labs  Lab 03/25/20 0456 03/25/20 1501 03/28/20 0459 03/29/20 0454  WBC 27.8*   < > 27.6* 29.2*  NEUTROABS 20.0*  --   --   --   HGB 8.6*   < > 8.4* 8.8*  HCT 27.6*   < > 28.2* 28.4*  MCV 100.7*   < > 102.5* 97.9  PLT 375   < >  360 376   < > = values in this interval not displayed.    Cardiac Enzymes: Recent Labs  Lab 03/27/20 0904  CKTOTAL 487*    BNP: Invalid input(s): POCBNP  CBG: No results for input(s): GLUCAP in the last 168 hours.  Microbiology: Recent Results (from the past 720 hour(s))  SARS Coronavirus 2 by RT PCR (hospital order, performed in [facility] hospital lab) Nasopharyngeal Nasopharyngeal Swab     Status: None   Collection Time: 03/21/20  8:06 AM   Specimen: Nasopharyngeal Swab  Result Value Ref Range Status   SARS Coronavirus 2 NEGATIVE NEGATIVE Final    Comment: (NOTE) SARS-CoV-2 target nucleic acids are NOT DETECTED.  The SARS-CoV-2 RNA is generally detectable in upper and lower respiratory specimens during the acute phase of infection. The lowest concentration of SARS-CoV-2 viral copies this assay can detect is 250 copies / mL. A negative result does not preclude SARS-CoV-2 infection and should not be used as the sole basis for treatment or other patient management decisions.  A negative result may occur with improper specimen collection / handling, submission of specimen other than nasopharyngeal swab, presence of viral mutation(s) within the areas targeted by this assay, and inadequate number of viral copies (<250 copies / mL). A negative result must be combined with clinical observations, patient  history, and epidemiological information.  Fact Sheet for Patients:   StrictlyIdeas.no  Fact Sheet for Healthcare Providers: BankingDealers.co.za  This test is not yet approved or  cleared by the [place] FDA and has been authorized for detection and/or diagnosis of SARS-CoV-2 by FDA under an Emergency Use Authorization (EUA).  This EUA will remain in effect (meaning this test can be used) for the duration of the COVID-19 declaration under Section 564(b)(1) of the Act, 21 U.S.C. section 360bbb-3(b)(1), unless the authorization is terminated or revoked sooner.  Performed at [facility], [address], [place], [place] [address]   Surgical PCR screen     Status: None   Collection Time: 03/21/20 12:15 PM   Specimen: Nasal Mucosa; Nasal Swab  Result Value Ref Range Status   MRSA, PCR NEGATIVE NEGATIVE Final   Staphylococcus aureus NEGATIVE NEGATIVE Final    Comment: (NOTE) The Xpert SA Assay (FDA approved for NASAL specimens in patients 88 years of age and older), is one component of a comprehensive surveillance program. It is not intended to diagnose infection nor to guide or monitor treatment. Performed at [facility], [address] [place],  [address]      Coagulation Studies: No results for input(s): LABPROT, INR in the last 72 hours.  Urinalysis: No results for input(s): COLORURINE, LABSPEC, PHURINE, GLUCOSEU, HGBUR, BILIRUBINUR, KETONESUR, PROTEINUR, UROBILINOGEN, NITRITE, LEUKOCYTESUR in the last 72 hours.  Invalid input(s): APPERANCEUR      Imaging: US RENAL  Result Date: 03/28/2020 CLINICAL DATA:  Acute renal insufficiency. EXAM: RENAL / URINARY TRACT ULTRASOUND COMPLETE COMPARISON:  None. FINDINGS: Right Kidney: Renal measurements: 9.5 x 3.9 x 5.0 cm = volume: 97 mL. Slight increased cortical echogenicity. No mass or hydronephrosis visualized. Left Kidney: Renal measurements: 7.8 x  4.7 x 4.6 cm = volume: 89 mL. Echogenicity within normal limits. No mass or hydronephrosis visualized. Bladder: Appears normal for degree of bladder distention. Prevoid volume 413 mL. Other: Small bilateral pleural effusions. IMPRESSION: 1. Kidneys at the lower limits of normal in size left smaller than right. No evidence of hydronephrosis. Slight increased right renal cortical echogenicity which can be seen in medical renal  disease. 2.  Small bilateral pleural effusions. Electronically Signed   By: Marin Olp M.D.   On: 03/28/2020 11:50   DG Chest Port 1 View  Result Date: 03/28/2020 CLINICAL DATA:  Cough EXAM: PORTABLE CHEST 1 VIEW COMPARISON:  March 21, 2020 FINDINGS: The mediastinal contour and cardiac silhouette are stable. Cardiac valvular replacement ring is identified unchanged. Increased pulmonary interstitium is identified bilaterally with enlarged central pulmonary vessel caliber. There are small bilateral pleural effusions. The bony structures are stable. IMPRESSION: Mild pulmonary edema. Small bilateral pleural effusions. Electronically Signed   By: Abelardo Diesel M.D.   On: 03/28/2020 09:46      Assessment & Plan: Pt is a 86 y.o.   female with HTN, hypothyroidosm, A Fib, CKD, was admitted on 03/21/2020 with Fall [W19.XXXA] Closed fracture of right hip, initial encounter [facility]) [S72.001A] Intertrochanteric fracture of right femur, closed, initial encounter ([place]) [S72.141A]  #Acute kidney injury Baseline creatinine of 0.93 from March 22, 2020 Creatinine has been fluctuating and has been increasing for the past few days from 1.1 to peak of 1.5 today Urinalysis from July 1 shows moderate blood with 11-20 RBCs, 11-20 WBCs with mild protein. Renal ultrasound from July 4 shows kidneys at lower limits of normal size, left smaller than right.  No hydronephrosis.  Slightly increased right renal echogenicity.  Small bilateral pleural effusions Acute kidney injury is likely secondary to volume  shifts -Avoid nephrotoxins, IV contrast, nonsteroidals -Avoid hypotension  #Volume overload Likely third spacing of fluid due to low albumin (of 2.5) likely Secondary to poor nutrition.  Boost or ensure supplements Plan to supplement IV albumin at low-dose of 12.5 g twice a day Monitor volume and pulmonary status closely We will follow      LOS: 8 Lorielle Boehning 7/5/202110:01 AM    Note: This note was prepared with Dragon dictation. Any transcription errors are unintentional

## 2020-03-29 NOTE — Progress Notes (Signed)
ST clarification note. Diet changed to Dysphagia 3 with chopped meats. Meds whole in applesauce. Will f/u with toleration of diet.

## 2020-03-29 NOTE — Progress Notes (Signed)
Pt is in bed resting quietly at this time. Right hip and pelvic area is Swolin and bruised along with right hip Is HOH. Has right leg elevated of bed. Received prn tramadol earler in this shift and was affective. Bed in low potion call light with in reach. Can take pills whole with no difficulties. Has difficulties swallowing  Will put in for an speech eval.Lungs around congested.

## 2020-03-29 NOTE — Progress Notes (Signed)
Called NP and received order for Telemetry and follow up Troponin.

## 2020-03-29 NOTE — Progress Notes (Signed)
  Subjective: 7 Days Post-Op Procedure(s) (LRB): INTRAMEDULLARY (IM) NAIL INTERTROCHANTRIC (Right) Patient reports pain as moderate.   Patient is well, and has had no acute complaints or problems Plan is to go Skilled nursing facility after hospital stay. Negative for chest pain and shortness of breath Fever: no Gastrointestinal: negative for nausea and vomiting.   Objective: Vital signs in last 24 hours: Temp:  [97.4 F (36.3 C)-98 F (36.7 C)] 97.7 F (36.5 C) (07/05 0733) Pulse Rate:  [81-82] 81 (07/05 0733) Resp:  [16-19] 19 (07/05 0733) BP: (116-125)/(58-68) 118/58 (07/05 0733) SpO2:  [93 %-95 %] 95 % (07/05 0733)  Intake/Output from previous day:  Intake/Output Summary (Last 24 hours) at 03/29/2020 1233 Last data filed at 03/29/2020 1044 Gross per 24 hour  Intake 120 ml  Output 1050 ml  Net -930 ml    Intake/Output this shift: Total I/O In: 120 [P.O.:120] Out: -   Labs: Recent Labs    03/27/20 0344 03/28/20 0459 03/29/20 0454  HGB 8.5* 8.4* 8.8*   Recent Labs    03/28/20 0459 03/29/20 0454  WBC 27.6* 29.2*  RBC 2.75* 2.90*  HCT 28.2* 28.4*  PLT 360 376   Recent Labs    03/28/20 0749 03/29/20 0454  NA 139 138  K 5.0 5.0  CL 109 108  CO2 22 24  BUN 70* 75*  CREATININE 1.32* 1.50*  GLUCOSE 103* 101*  CALCIUM 8.2* 7.9*   No results for input(s): LABPT, INR in the last 72 hours.   EXAM General - Patient is Appropriate, Oriented and but somewhat lethargic lethargic Extremity - Neurovascular intact Dorsiflexion/Plantar flexion intact Compartment soft ecchymosis noted along the mid and lower right side of the back, lateral thigh extending to just above the ankle, and medial thigh extending to the groin and just above the ankle. No change noted from yesterday. Minimally tender to palpation. Dressing/Incision -significant sanguinous drainage noted from proximal incision, with mild serous drainage noted from middle incision.  Additional serous drainage  noted from the medial incision near the groin. Motor Function - intact, moving foot and toes well on exam.     Assessment/Plan: 7 Days Post-Op Procedure(s) (LRB): INTRAMEDULLARY (IM) NAIL INTERTROCHANTRIC (Right) Principal Problem:   Intertrochanteric fracture of right femur (HCC) Active Problems:   Hypertension   Thyroid disease   Atrial fibrillation with RVR (HCC)   Intertrochanteric fracture of right femur, closed, initial encounter (HCC)   Chronic kidney disease, stage 4 (severe) (HCC)   Protein-calorie malnutrition, severe   Leukocytosis  Estimated body mass index is 22.19 kg/m as calculated from the following:   Height as of this encounter: 5' (1.524 m).   Weight as of this encounter: 51.5 kg. Up with therapy  Previous compression dressing removed.  Fresh honeycomb dressings applied over incisions.  Dressings reinforced with ABDs and paper tape.  Compressive figure-of-eight hip wrap applied with Ace wrap.  DVT Prophylaxis - Eloquis and SCDs Weight-Bearing as tolerated to right leg  Cassell Smiles, PA-C [facility] Orthopaedic Surgery 03/29/2020, 12:33 PM

## 2020-03-29 NOTE — Progress Notes (Signed)
CRITICAL VALUE ALERT  Critical Value: 458  Date & Time Notied:  4:40 pm 03/29/2020  Provider Notified: Jolyn Nap (cardiologist)  Pt asymptomatic, per MD will wait for next troponin.  No new orders at this time

## 2020-03-29 NOTE — Care Management Important Message (Signed)
Important Message  Patient Details  Name: Rebecca Cruz MRN: [id number] Date of Birth: [birth year]   Medicare Important Message Given:  Yes     [name] A Jarquavious Fentress 03/29/2020, 11:32 AM

## 2020-03-29 NOTE — Evaluation (Addendum)
Clinical/Bedside Swallow Evaluation Patient Details  Name: Loriann Bosserman MRN: [id number] Date of Birth: 01/22/1924  Today's Date: 03/29/2020 Time: SLP Start Time (ACUTE ONLY): 1140 SLP Stop Time (ACUTE ONLY): 1208 SLP Time Calculation (min) (ACUTE ONLY): 28 min  Past Medical History:  Past Medical History:  Diagnosis Date   Hypertension    Thyroid disease    Past Surgical History:  Past Surgical History:  Procedure Laterality Date   ABDOMINAL HYSTERECTOMY     AORTIC VALVE REPAIR     APPENDECTOMY     INTRAMEDULLARY (IM) NAIL INTERTROCHANTERIC Right 03/22/2020   Procedure: INTRAMEDULLARY (IM) NAIL INTERTROCHANTRIC;  Surgeon: Leim Fabry, MD;  Location: [facility] ORS;  Service: Orthopedics;  Laterality: Right;   HPI:  Per admitting H and P: "Shamell Hittle is a 86 y.o. female with medical history significant for hypertension, hypothyroidism, stage IV chronic kidney disease and atrial fibrillation on chronic anticoagulation therapy who is visiting family in the area. She was brought in to the ER by EMS for evaluation of right hip pain. Patient ambulates with a walker and stated that she lost her balance and fell on the floor inside the house. She was able to get up and get back in bed but this morning was unable to get out of bed.  She is noted to have swelling in the right upper thigh as well as shortening and internal rotation of her right leg."   Assessment / Plan / Recommendation Clinical Impression  Bedside swallow eval today revealed mild to moderate dysphagia but no immediate overt s/s of aspiration with any tested consistency. Pt was sleepy throughout assessment but able to participate. Oral mech exam revealed mild to moderate overall oral motor weakness. Pt took very small bites and sips of each consistency. Noted belching after each sip of thin liquid but no s/s of aspiration. Pt tolerated purees well. She also tolerated the very small bite of graham cracker with minimal oral  residue and moderate oral transit delay. Pt reported she was not hungry and did not wish to try more PO's. Occasional wheezing noted on inhalation during bedside swallow eval. Rec meds to be given whole in apple sauce as opposed to with water. Diet changed to Dysphagia 2. ST to follow up with toleration of diet. Will consider Mod ba swallow if any further concerns are seen. SLP Visit Diagnosis: Dysphagia, oropharyngeal phase (R13.12)    Aspiration Risk  Mild aspiration risk;Moderate aspiration risk    Diet Recommendation Dysphagia 3, chopped meats   Liquid Administration via: Cup;Straw Medication Administration: Whole meds with puree Supervision: Staff to assist with self feeding Compensations: Small sips/bites;Slow rate;Minimize environmental distractions Postural Changes: Seated upright at 90 degrees;Remain upright for at least 30 minutes after po intake    Other  Recommendations Recommended Consults: Consider GI evaluation   Follow up Recommendations   Consider Mod ba swallow if any further dysphagia is noted     Frequency and Duration min 1 x/week  1 week       Prognosis   Good     Swallow Study   General Date of Onset: 03/28/20 HPI: Per admitting H and P: "Nihira Puello is a 86 y.o. female with medical history significant for hypertension, hypothyroidism, stage IV chronic kidney disease and atrial fibrillation on chronic anticoagulation therapy who is visiting family in the area. She was brought in to the ER by EMS for evaluation of right hip pain. Patient ambulates with a walker and stated that she lost her balance and fell  on the floor inside the house. She was able to get up and get back in bed but this morning was unable to get out of bed.  She is noted to have swelling in the right upper thigh as well as shortening and internal rotation of her right leg." Type of Study: Bedside Swallow Evaluation Diet Prior to this Study: Regular Temperature Spikes Noted:  No Respiratory Status: Room air History of Recent Intubation: No Behavior/Cognition: Cooperative;Pleasant mood;Lethargic/Drowsy Oral Cavity Assessment: Dry Oral Cavity - Dentition: Other (Comment) (Wears partials) Vision: Functional for self-feeding Self-Feeding Abilities: Needs assist;Needs set up Patient Positioning: Upright in bed Baseline Vocal Quality: Normal    Oral/Motor/Sensory Function Overall Oral Motor/Sensory Function: Mild impairment Facial ROM: Within Functional Limits Facial Symmetry: Within Functional Limits Lingual ROM: Reduced right;Reduced left Lingual Symmetry: Within Functional Limits Lingual Strength: Reduced Velum: Within Functional Limits   Ice Chips Ice chips: Within functional limits Presentation: Spoon   Thin Liquid Thin Liquid: Impaired (Pt with belching after each sip) Presentation: Cup;Spoon;Straw    Nectar Thick Nectar Thick Liquid: Not tested   Honey Thick Honey Thick Liquid: Not tested   Puree Puree: Within functional limits Presentation: Self Fed;Spoon   Solid     Solid: Impaired Presentation: Self Fed Oral Phase Impairments: Impaired mastication;Reduced lingual movement/coordination Oral Phase Functional Implications: Oral residue;Impaired mastication;Prolonged oral transit      Lucila Maine 03/29/2020,12:09 PM

## 2020-03-29 NOTE — Progress Notes (Signed)
Occupational Therapy Treatment Patient Details Name: Rebecca Cruz MRN: [id number] DOB: 01-17-1924 Today's Date: 03/29/2020    History of present illness Pt is a 86 y.o. female presenting to hospital 6/27 s/p fall (tripped and fell last night) sustaining R hip pain.  Imaging showing: "Displaced comminuted fracture through the right femoral intertrochanteric region. A nondisplaced fracture through the right inferior pubic ramus is not excluded."  Per MD Posey Pronto note (ortho): "We will additionally plan to treat possible right inferior pubic ramus fracture non-operatively as there are no other signs of significant pelvic ring injury."  Pt s/p IM nailing of R femur with cephalomedullary device 6/28 secondary R intertrochanteric hip fx.  Significant ecchymosis noted post-op; medical staff aware and monitoring. PMH includes htn, thyroid disease, aortic valve repair, appendectomy, abdominal hysterectomy, stage IV CKD, a-fib.   OT comments  Pt presents supine in bed, awake and conversational throughout. She presents with decreased endurance and activity tolerance, declining mobility or transfer to EOB this date. Pt participates in bed level grooming tasks (oral care, combing hair) with Setup A. Physical assist provided for opening lip balm due to decreased fine motor strength. Pt attempted to complete roll to left side for bed pan placement, quickly fatiguing and reporting 9/10 pain in R hip/thigh. RN notified. Pt declined further ADL participation due to pain and fatigue. She continues to present with decreased functional participation compared to her baseline (see below for more information) and will continue to benefit from skilled acute OT services while in house to optimize meaningful pt participation and outcomes.     Follow Up Recommendations  SNF    Equipment Recommendations  Other (comment) (TBD at next venue)    Recommendations for Other Services      Precautions / Restrictions  Precautions Precautions: Fall Restrictions Weight Bearing Restrictions: Yes RLE Weight Bearing: Weight bearing as tolerated Other Position/Activity Restrictions: WBAT LE       Mobility Bed Mobility Overal bed mobility: Needs Assistance Bed Mobility: Supine to Sit;Sit to Supine     Supine to sit: Mod assist Sit to supine: Mod assist   General bed mobility comments: attempted roll to left side, limited and deferred by 9/10 pain in R hip  Transfers Overall transfer level: Needs assistance Equipment used: Rolling walker (2 wheeled) Transfers: Sit to/from Stand Sit to Stand: Max assist         General transfer comment: deferred this session    Balance Overall balance assessment: Needs assistance Sitting-balance support: Bilateral upper extremity supported;Feet supported Sitting balance-Leahy Scale: Fair     Standing balance support: Bilateral upper extremity supported Standing balance-Leahy Scale: Poor Standing balance comment: Posterior instability in standing requiring constant physical assist to prevent LOB                           ADL either performed or assessed with clinical judgement   ADL Overall ADL's : Needs assistance/impaired     Grooming: Brushing hair;Set up;Bed level;Oral care Grooming Details (indicate cue type and reason): tolerated grooming at bed level with HOB elevated, assist provided for opening chapstick due to decreased fine motor strength                       Toileting - Clothing Manipulation Details (indicate cue type and reason): attempted to place bedpan at bed level, deferred due to pt in 9/10 R hip pain       General ADL Comments: Pt currently  participates in bed level UB ADL (oral care, brushing hair) with Setup-Min A. Functional mobility and LB ADL deferred 2/2 pt fatigue and generalized weakness.     Vision Baseline Vision/History: Wears glasses Wears Glasses: At all times     Perception     Praxis       Cognition Arousal/Alertness: Awake/alert Behavior During Therapy: WFL for tasks assessed/performed Overall Cognitive Status: No family/caregiver present to determine baseline cognitive functioning                                 General Comments: awake and alert on arrival, oriented to questions about self and maintains conversation, does fatigue easily and begins to close eyes        Exercises Total Joint Exercises Ankle Circles/Pumps: AROM;Strengthening;Both;10 reps Quad Sets: AROM;Strengthening;Both;10 reps Hip ABduction/ADduction: AAROM;Strengthening;Right;10 reps Straight Leg Raises: AAROM;Strengthening;Right;10 reps Long Arc Quad: AROM;Strengthening;10 reps;Both Knee Flexion: AROM;Strengthening;Both;10 reps Other Exercises Other Exercises: Facilitated bed level grooming to increase pt activity tolerance, fine motor strength and overal functional participation   Shoulder Instructions       General Comments      Pertinent Vitals/ Pain       Pain Assessment: 0-10 Pain Score: 9  Pain Location: R hip/thigh Pain Descriptors / Indicators: Grimacing;Aching;Sore Pain Intervention(s): Limited activity within patient's tolerance;Monitored during session;Patient requesting pain meds-RN notified  Home Living                                          Prior Functioning/Environment              Frequency  Min 2X/week        Progress Toward Goals  OT Goals(current goals can now be found in the care plan section)  Progress towards OT goals: OT to reassess next treatment  Acute Rehab OT Goals Patient Stated Goal: To be able to move better OT Goal Formulation: With patient Time For Goal Achievement: 04/06/20 Potential to Achieve Goals: Good  Plan Discharge plan remains appropriate;Frequency remains appropriate    Co-evaluation                 AM-PAC OT "6 Clicks" Daily Activity     Outcome Measure   Help from another person  eating meals?: None Help from another person taking care of personal grooming?: None Help from another person toileting, which includes using toliet, bedpan, or urinal?: A Lot Help from another person bathing (including washing, rinsing, drying)?: A Lot Help from another person to put on and taking off regular upper body clothing?: A Lot Help from another person to put on and taking off regular lower body clothing?: A Lot 6 Click Score: 16    End of Session    OT Visit Diagnosis: Other abnormalities of gait and mobility (R26.89);History of falling (Z91.81);Muscle weakness (generalized) (M62.81);Pain Pain - Right/Left: Right Pain - part of body: Hip   Activity Tolerance Patient tolerated treatment well;Patient limited by fatigue   Patient Left in bed;with bed alarm set;with call bell/phone within reach;with SCD's reapplied   Nurse Communication Patient requests pain meds        Time: 1448-1856 OT Time Calculation (min): 16 min  Charges: OT General Charges $OT Visit: 1 Visit OT Treatments $Self Care/Home Management : 8-22 mins  Jerilynn Birkenhead, OTS 03/29/20, 4:54 PM

## 2020-03-29 NOTE — TOC Progression Note (Signed)
Transition of Care [facility]) - Progression Note    Patient Details  Name: Rebecca Cruz MRN: [id number] Date of Birth: 09-19-1924  Transition of [facility] CM/SW Contact  Su Hilt, RN Phone Number: 03/29/2020, 10:16 AM  Clinical Narrative:    Spoke with the patient's son Eddie Dibbles and let him know that the physician is saying the patient is not stable for Discharge, Nephrology and Cardiology is to see the patient. He will call the transport company and let them know        Expected Discharge Plan and Services                                                 Social Determinants of Health (SDOH) Interventions    Readmission Risk Interventions No flowsheet data found.

## 2020-03-29 NOTE — Consult Note (Signed)
ELECTROPHYSIOLOGY CONSULT NOTE  Patient ID: Rebecca Cruz, MRN: [id number], DOB/AGE: [birth year] 86 y.o. Admit date: 03/21/2020 Date of Consult: 03/29/2020  Primary Physician: Patient, No Pcp Per Primary Cardiologist: none/[place] Letrice Pollok is a 86 y.o. female who is being seen today for the evaluation of CHF  at the request of AH.   Chief Complaint:  Feels terrible    HPI Rebecca Cruz is a 86 y.o. female admitted with hip fracture for which she underwent surgery  03/22/20  We are asked to see for  atrial fibrillation and congestive heart failure in the setting of known AVR with prosthetic AS ( moderate by echo 1/20), PFO/ASD and tricuspid valve repair and hypertension; post op course also complicated by increasing Cr ( [place]--  [baseline 1.0]), hypotension , bleeding, AMS  Poorly able to give history but per physician's notes increasing shortness of breath over the last 48 hours.  Chest x-ray and BNP supportive of a diagnosis of congestive heart failure.  The patient denies prior issues with shortness of breath. Denies chest pain-- no known CAD  Leg hurts, difficult to breath overall just feels lousy    Noted in house to elevated WBC-thought reactive   CXR 6/27 mild edema; still present on ECG yday   Atrial fibrillation is described as chronic by tidal Cardiology (CE)    DATE TEST EF   1/20 Echo (CE)   >70%  TR mod/AS mod        Date Cr K TSH Hgb  3/21 0.99 4.5 1.839 16.3  7/21 1.5 5.0  8.8    Thromboembolic risk factors ( age  -2, HTN-1,   , Gender-1) for a CHADSVASc Score of >=4   Past Medical History:  Diagnosis Date  . Hypertension   . Thyroid disease       Surgical History:  Past Surgical History:  Procedure Laterality Date  . ABDOMINAL HYSTERECTOMY    . AORTIC VALVE REPAIR    . APPENDECTOMY    . INTRAMEDULLARY (IM) NAIL INTERTROCHANTERIC Right 03/22/2020   Procedure: INTRAMEDULLARY (IM) NAIL INTERTROCHANTRIC;  Surgeon: Leim Fabry, MD;   Location: [facility] ORS;  Service: Orthopedics;  Laterality: Right;     Home Meds: Prior to Admission medications   Medication Sig Start Date End Date Taking? Authorizing Provider  acetaminophen (TYLENOL) 500 MG tablet Take 500 mg by mouth every 6 (six) hours as needed for headache.   Yes [provider]  calcium acetate (PHOSLO) 667 MG capsule Take 667 mg by mouth 2 (two) times daily.   Yes [provider]  cetirizine (ZYRTEC) 10 MG tablet Take 10 mg by mouth daily at 6 (six) AM. 08/25/17  Yes [provider]  ELIQUIS 2.5 MG TABS tablet Take 2.5 mg by mouth 2 (two) times daily. 03/01/20  Yes [provider]  levothyroxine (SYNTHROID) 75 MCG tablet Take 75 mcg by mouth daily. 03/01/20  Yes [provider]  losartan (COZAAR) 100 MG tablet Take 100 mg by mouth daily. 03/12/20  Yes [provider]  metoprolol succinate (TOPROL-XL) 50 MG 24 hr tablet Take 50 mg by mouth daily. 03/01/20  Yes [provider]  Multiple Vitamin (MULTIVITAMIN) capsule Take 1 capsule by mouth daily.   Yes [provider]  oxyCODONE (OXY IR/ROXICODONE) 5 MG immediate release tablet Take 0.5-1 tablets (2.5-5 mg total) by mouth every 4 (four) hours as needed for moderate pain or severe pain (pain score 4-6). 03/23/20   Reche [name], PA-C  traMADol (ULTRAM) 50 MG  tablet Take 1 tablet (50 mg total) by mouth every 6 (six) hours as needed for moderate pain. 03/23/20   Reche [name], PA-C    Inpatient Medications:  . apixaban  2.5 mg Oral BID  . calcium acetate  667 mg Oral BID WC  . Chlorhexidine Gluconate Cloth  6 each Topical Daily  . docusate sodium  100 mg Oral BID  . feeding supplement (ENSURE ENLIVE)  237 mL Oral BID BM  . levothyroxine  75 mcg Oral Q0600  . metoprolol succinate  25 mg Oral Daily  . polyethylene glycol  17 g Oral BID  . senna-docusate  2 tablet Oral Daily      Allergies:  Allergies  Allergen Reactions  . Codeine     With codeine     Social History   Socioeconomic History  . Marital status: Significant Other    Spouse name: Not on file  . Number of children: Not on file  . Years of education: Not on file  . Highest education level: Not on file  Occupational History  . Not on file  Tobacco Use  . Smoking status: Never Smoker  . Smokeless tobacco: Never Used  Substance and Sexual Activity  . Alcohol use: Yes    Comment: once a week   . Drug use: Not on file  . Sexual activity: Not on file  Other Topics Concern  . Not on file  Social History Narrative  . Not on file   Social Determinants of Health   Financial Resource Strain:   . Difficulty of Paying Living Expenses:   Food Insecurity:   . Worried About Charity fundraiser in the Last Year:   . Arboriculturist in the Last Year:   Transportation Needs:   . Film/video editor (Medical):   Marland Kitchen Lack of Transportation (Non-Medical):   Physical Activity:   . Days of Exercise per Week:   . Minutes of Exercise per Session:   Stress:   . Feeling of Stress :   Social Connections:   . Frequency of Communication with Friends and Family:   . Frequency of Social Gatherings with Friends and Family:   . Attends Religious Services:   . Active Member of Clubs or Organizations:   . Attends Archivist Meetings:   Marland Kitchen Marital Status:   Intimate Partner Violence:   . Fear of Current or Ex-Partner:   . Emotionally Abused:   Marland Kitchen Physically Abused:   . Sexually Abused:      History reviewed. No pertinent family history.   ROS:  Please see the history of present illness.     All other systems reviewed and negative.    Physical Exam:  Blood pressure (!) 118/58, pulse 81, temperature 97.7 F (36.5 C), temperature source Oral, resp. rate 19, height 5' (1.524 m), weight 51.5 kg, SpO2 95 %. General: Well developed, well nourished female in no acute distress. Head: Normocephalic, atraumatic, sclera non-icteric, no xanthomas, nares are without  discharge. EENT: normal Lymph Nodes:  none Back: without scoliosis/kyphosis , no CVA tendersness Neck: Negative for carotid bruits. JVD 8-10cm Lungs bibasilar crackles  heart: Irregularly irregular rate and rhythm with S1 S2.  2-9/5 systolic  murmur , rubs, or gallops appreciated. Abdomen: Soft, non-tender, non-distended with normoactive bowel sounds. No hepatomegaly. No rebound/guarding. No obvious abdominal masses. Msk:  Strength and tone appear normal for age. Extremities: No clubbing or cyanosis. No  edema.  Distal pedal pulses are 2+ and equal  bilaterally. Skin: Warm and Dry Neuro: Alert and oriented X 3. CN III-XII intact Grossly normal sensory and motor function . Psych:  Responds to questions appropriately with a normal affect.      Labs: Cardiac Enzymes Recent Labs    03/27/20 0904  CKTOTAL 487*   CBC Lab Results  Component Value Date   WBC 29.2 (H) 03/29/2020   HGB 8.8 (L) 03/29/2020   HCT 28.4 (L) 03/29/2020   MCV 97.9 03/29/2020   PLT 376 03/29/2020   PROTIME: No results for input(s): LABPROT, INR in the last 72 hours. Chemistry  Recent Labs  Lab 03/29/20 0454  NA 138  K 5.0  CL 108  CO2 24  BUN 75*  CREATININE 1.50*  CALCIUM 7.9*  GLUCOSE 101*   Lipids No results found for: CHOL, HDL, LDLCALC, TRIG BNP No results found for: PROBNP Thyroid Function Tests: No results for input(s): TSH, T4TOTAL, T3FREE, THYROIDAB in the last 72 hours.  Invalid input(s): FREET3    Miscellaneous No results found for: DDIMER  Radiology/Studies:  DG Chest 1 View  Result Date: 03/21/2020 CLINICAL DATA:  Preoperative study EXAM: CHEST  1 VIEW COMPARISON:  None. FINDINGS: Cardiomegaly. Diffuse interstitial opacities and mild Kerley B lines. No pneumothorax. No nodules or masses. No focal infiltrates. IMPRESSION: Cardiomegaly and mild edema. Electronically Signed   By: Dorise Bullion III M.D   On: 03/21/2020 09:09   DG Pelvis 1-2 Views  Result Date:  03/21/2020 CLINICAL DATA:  Pain after fall EXAM: PELVIS - 1-2 VIEW COMPARISON:  None. FINDINGS: A comminuted displaced fracture through the right femoral intertrochanteric region is identified. A nondisplaced left inferior pubic ramus fracture is not excluded. No other bony abnormalities in the pelvic bones. Vascular calcifications. IMPRESSION: 1. Displaced comminuted fracture through the right femoral intertrochanteric region. A nondisplaced fracture through the right inferior pubic ramus is not excluded. Electronically Signed   By: Dorise Bullion III M.D   On: 03/21/2020 09:12   CT Head Wo Contrast  Result Date: 03/21/2020 CLINICAL DATA:  [facility]. Pt visiting from [place]. Uses cane/walked. States she lost her balance and fell on floor inside house. Swelling noted to R upper thigh. Internal rotation to R leg. Shortening as well. A&. TKV EXAM: CT HEAD WITHOUT CONTRAST TECHNIQUE: Contiguous axial images were obtained from the base of the skull through the vertex without intravenous contrast. COMPARISON:  None. FINDINGS: Brain: No evidence of acute infarction, hemorrhage, hydrocephalus, extra-axial collection or mass lesion/mass effect. There is ventricular sulcal enlargement reflecting mild diffuse atrophy. Patchy bilateral white matter hypoattenuation is also noted consistent with chronic microvascular ischemic change. Vascular: No hyperdense vessel or unexpected calcification. Skull: Normal. Negative for fracture or focal lesion. Sinuses/Orbits: Visualized globes and orbits are unremarkable. The visualized sinuses are clear. Other: There is a small amount of soft tissue air below the skull base, projecting adjacent to the right pterygoid plates, posterior to the right maxillary sinus and in the infratemporal fossa. The etiology of this is unclear. IMPRESSION: 1. No acute intracranial abnormalities. 2. Mild atrophy and moderate chronic microvascular ischemic change. 3. Small amount  of soft tissue air at the right skull base as detailed above. Consider a facial fracture, follow-up facial CT, if there is facial trauma. Electronically Signed   By: Lajean Manes M.D.   On: 03/21/2020 09:14   US RENAL  Result Date: 03/28/2020 CLINICAL DATA:  Acute renal insufficiency. EXAM: RENAL / URINARY TRACT ULTRASOUND COMPLETE COMPARISON:  None. FINDINGS:  Right Kidney: Renal measurements: 9.5 x 3.9 x 5.0 cm = volume: 97 mL. Slight increased cortical echogenicity. No mass or hydronephrosis visualized. Left Kidney: Renal measurements: 7.8 x 4.7 x 4.6 cm = volume: 89 mL. Echogenicity within normal limits. No mass or hydronephrosis visualized. Bladder: Appears normal for degree of bladder distention. Prevoid volume 413 mL. Other: Small bilateral pleural effusions. IMPRESSION: 1. Kidneys at the lower limits of normal in size left smaller than right. No evidence of hydronephrosis. Slight increased right renal cortical echogenicity which can be seen in medical renal disease. 2.  Small bilateral pleural effusions. Electronically Signed   By: Marin Olp M.D.   On: 03/28/2020 11:50   DG Chest Port 1 View  Result Date: 03/28/2020 CLINICAL DATA:  Cough EXAM: PORTABLE CHEST 1 VIEW COMPARISON:  March 21, 2020 FINDINGS: The mediastinal contour and cardiac silhouette are stable. Cardiac valvular replacement ring is identified unchanged. Increased pulmonary interstitium is identified bilaterally with enlarged central pulmonary vessel caliber. There are small bilateral pleural effusions. The bony structures are stable. IMPRESSION: Mild pulmonary edema. Small bilateral pleural effusions. Electronically Signed   By: Abelardo Diesel M.D.   On: 03/28/2020 09:46   DG Abd Portable 1V  Result Date: 03/25/2020 CLINICAL DATA:  Abdominal pain and distension EXAM: PORTABLE ABDOMEN - 1 VIEW COMPARISON:  None. FINDINGS: There is moderate stool throughout the colon. There is no bowel dilatation or air-fluid level to suggest bowel  obstruction. No free air. There are scattered foci of arterial vascular calcification. There is postoperative change in the proximal right femur. Bones are osteoporotic. IMPRESSION: Moderate stool in colon.  No bowel obstruction or free air evident. Electronically Signed   By: Lowella Grip III M.D.   On: 03/25/2020 10:10   DG HIP OPERATIVE UNILAT W OR W/O PELVIS RIGHT  Result Date: 03/22/2020 CLINICAL DATA:  Right proximal femur fracture. EXAM: OPERATIVE right HIP (WITH PELVIS IF PERFORMED) 6 VIEWS TECHNIQUE: Fluoroscopic spot image(s) were submitted for interpretation post-operatively. FLUOROSCOPY TIME:  2 minutes 5 seconds. COMPARISON:  March 21, 2020. FINDINGS: Six intraoperative fluoroscopic images were obtained of the right hip and femur. These images demonstrate surgical internal fixation of proximal right femoral fracture with intramedullary rod fixation of the right femoral shaft. Improved alignment of fracture components is noted. IMPRESSION: Status post surgical internal fixation of proximal right femoral fracture. Electronically Signed   By: Marijo Conception M.D.   On: 03/22/2020 13:58   DG Femur Min 2 Views Right  Result Date: 03/21/2020 CLINICAL DATA:  Preoperative study EXAM: RIGHT FEMUR 2 VIEWS COMPARISON:  None. FINDINGS: A displaced comminuted intertrochanteric fracture seen in the right hip. The remainder of the femur is intact. The proximal tibia and fibula are intact. No femoral head dislocation. A fracture through the left inferior pubic ramus is not excluded. No other acute abnormalities. IMPRESSION: 1. The patient has a displaced comminuted fracture through the right femoral intertrochanteric region without dislocation. 2. A nondisplaced left inferior pubic ramus fracture is not excluded. 3. No other abnormalities. Electronically Signed   By: Dorise Bullion III M.D   On: 03/21/2020 09:11    EKG: 6/27 afib with LVH and baseline artifact limiting interpretation   Assessment and  Plan:  Congestive heart failure acute/chronic/diastolic  Atrial fibrillation-permanent  Aortic stenosis-mod  Renal insufficiency acute/chronic  Leukocytosis -reactive  Anemia    Pt with postoperative CHF in setting of AF permanent--anemia and hypoalbuminemia may be contributing.  Transfusion may be indicated.  Will check electrocardiogram  and troponins to look for evidence of intercurrent MI although intervening at this point would be something we will undertake reluctantly; repeat echo is pending which will give [place] more information on the status of her aortic valve.  For now would proceed with gentle diuresis.           Virl Axe

## 2020-03-29 NOTE — Progress Notes (Signed)
Physical Therapy Treatment Patient Details Name: Rebecca Cruz MRN: [id number] DOB: 11/26/1923 Today's Date: 03/29/2020    History of Present Illness Pt is a 86 y.o. female presenting to hospital 6/27 s/p fall (tripped and fell last night) sustaining R hip pain.  Imaging showing: "Displaced comminuted fracture through the right femoral intertrochanteric region. A nondisplaced fracture through the right inferior pubic ramus is not excluded."  Per MD Posey Pronto note (ortho): "We will additionally plan to treat possible right inferior pubic ramus fracture non-operatively as there are no other signs of significant pelvic ring injury."  Pt s/p IM nailing of R femur with cephalomedullary device 6/28 secondary R intertrochanteric hip fx.  Significant ecchymosis noted post-op; medical staff aware and monitoring. PMH includes htn, thyroid disease, aortic valve repair, appendectomy, abdominal hysterectomy, stage IV CKD, a-fib.    PT Comments    Pt somewhat lethargic but eager to attempt to get out of bed and put forth fair effort throughout the session.  Pt required significant physical assistance with all functional tasks and was unable to take steps this session.  Upon standing the pt presented with posterior lean causing feet to slide anteriorly with pt unable to correct even with heavy assist.   Pt reported no adverse symptoms during the session with SpO2 and HR WNL throughout.  Pt will benefit from PT services in a SNF setting upon discharge to safely address deficits listed in patient problem list for decreased caregiver assistance and eventual return to PLOF.      Follow Up Recommendations  SNF     Equipment Recommendations  Rolling walker with 5" wheels;3in1 (PT)    Recommendations for Other Services       Precautions / Restrictions Precautions Precautions: Fall Restrictions Weight Bearing Restrictions: Yes RLE Weight Bearing: Weight bearing as tolerated Other Position/Activity  Restrictions: WBAT LE    Mobility  Bed Mobility Overal bed mobility: Needs Assistance Bed Mobility: Supine to Sit;Sit to Supine     Supine to sit: Mod assist Sit to supine: Mod assist   General bed mobility comments: Mod A for BLE and trunk control  Transfers Overall transfer level: Needs assistance Equipment used: Rolling walker (2 wheeled) Transfers: Sit to/from Stand Sit to Stand: Max assist         General transfer comment: Mod verbal and tactile cues for sequencing  Ambulation/Gait             General Gait Details: Unable/unsafe to attempt   Stairs             Wheelchair Mobility    Modified Rankin (Stroke Patients Only)       Balance Overall balance assessment: Needs assistance Sitting-balance support: Bilateral upper extremity supported;Feet supported Sitting balance-Leahy Scale: Fair     Standing balance support: Bilateral upper extremity supported Standing balance-Leahy Scale: Poor Standing balance comment: Posterior instability in standing requiring constant physical assist to prevent LOB                            Cognition Arousal/Alertness: Lethargic Behavior During Therapy: Flat affect Overall Cognitive Status: No family/caregiver present to determine baseline cognitive functioning                                        Exercises Total Joint Exercises Ankle Circles/Pumps: AROM;Strengthening;Both;10 reps Quad Sets: AROM;Strengthening;Both;10 reps Hip ABduction/ADduction: AAROM;Strengthening;Right;10 reps Straight  Leg Raises: AAROM;Strengthening;Right;10 reps Long Arc Quad: AROM;Strengthening;10 reps;Both Knee Flexion: AROM;Strengthening;Both;10 reps    General Comments        Pertinent Vitals/Pain Pain Assessment: 0-10 Pain Score: 4  Pain Location: R hip/thigh Pain Descriptors / Indicators: Grimacing;Aching;Sore Pain Intervention(s): Premedicated before session;Monitored during session     Home Living                      Prior Function            PT Goals (current goals can now be found in the care plan section) Progress towards PT goals: Not progressing toward goals - comment (Pt limited by functional weakness)    Frequency    BID      PT Plan Current plan remains appropriate    Co-evaluation              AM-PAC PT "6 Clicks" Mobility   Outcome Measure  Help needed turning from your back to your side while in a flat bed without using bedrails?: A Lot Help needed moving from lying on your back to sitting on the side of a flat bed without using bedrails?: A Lot Help needed moving to and from a bed to a chair (including a wheelchair)?: Total Help needed standing up from a chair using your arms (e.g., wheelchair or bedside chair)?: A Lot Help needed to walk in hospital room?: Total Help needed climbing 3-5 steps with a railing? : Total 6 Click Score: 9    End of Session Equipment Utilized During Treatment: Gait belt Activity Tolerance: Patient tolerated treatment well Patient left: in bed;with call bell/phone within reach;with bed alarm set;with SCD's reapplied Nurse Communication: Mobility status PT Visit Diagnosis: Other abnormalities of gait and mobility (R26.89);Muscle weakness (generalized) (M62.81);History of falling (Z91.81);Difficulty in walking, not elsewhere classified (R26.2);Pain Pain - Right/Left: Right Pain - part of body: Hip     Time: 3500-9381 PT Time Calculation (min) (ACUTE ONLY): 26 min  Charges:  $Therapeutic Exercise: 8-22 mins $Therapeutic Activity: 8-22 mins                     D. Scott Levonte Molina PT, DPT 03/29/20, 2:28 PM

## 2020-03-29 NOTE — Progress Notes (Signed)
Physical Therapy Treatment Patient Details Name: Rebecca Cruz MRN: [id number] DOB: 1924/02/04 Today's Date: 03/29/2020    History of Present Illness Pt is a 86 y.o. female presenting to hospital 6/27 s/p fall (tripped and fell last night) sustaining R hip pain.  Imaging showing: "Displaced comminuted fracture through the right femoral intertrochanteric region. A nondisplaced fracture through the right inferior pubic ramus is not excluded."  Per MD Posey Pronto note (ortho): "We will additionally plan to treat possible right inferior pubic ramus fracture non-operatively as there are no other signs of significant pelvic ring injury."  Pt s/p IM nailing of R femur with cephalomedullary device 6/28 secondary R intertrochanteric hip fx.  Significant ecchymosis noted post-op; medical staff aware and monitoring. PMH includes htn, thyroid disease, aortic valve repair, appendectomy, abdominal hysterectomy, stage IV CKD, a-fib.    PT Comments    Pt somewhat lethargic during the session but actively participated with below therex.  Per cardiologist request session limited to supine therex only while troponin trend is being assessed.  Most recent troponin >400.  Unable to obtain pt's HR/SpO2 during the session due to poor waveform but no signs of distress other than R hip pain during the session.  Pt will benefit from PT services in a SNF setting upon discharge to safely address deficits listed in patient problem list for decreased caregiver assistance and eventual return to PLOF.    Follow Up Recommendations  SNF     Equipment Recommendations  Rolling walker with 5" wheels;3in1 (PT)    Recommendations for Other Services       Precautions / Restrictions Precautions Precautions: Fall Restrictions Weight Bearing Restrictions: Yes RLE Weight Bearing: Weight bearing as tolerated Other Position/Activity Restrictions: WBAT LE    Mobility  Bed Mobility Overal bed mobility: Needs Assistance Bed Mobility:  Supine to Sit;Sit to Supine     Supine to sit: Mod assist Sit to supine: Mod assist   General bed mobility comments: Supine therex only this session per cardiologist request  Transfers Overall transfer level: Needs assistance Equipment used: Rolling walker (2 wheeled) Transfers: Sit to/from Stand Sit to Stand: Max assist         General transfer comment: deferred this session  Ambulation/Gait             General Gait Details: Unable/unsafe to attempt   Stairs             Wheelchair Mobility    Modified Rankin (Stroke Patients Only)       Balance Overall balance assessment: Needs assistance Sitting-balance support: Bilateral upper extremity supported;Feet supported Sitting balance-Leahy Scale: Fair     Standing balance support: Bilateral upper extremity supported Standing balance-Leahy Scale: Poor Standing balance comment: Posterior instability in standing requiring constant physical assist to prevent LOB                            Cognition Arousal/Alertness: Lethargic Behavior During Therapy: WFL for tasks assessed/performed Overall Cognitive Status: No family/caregiver present to determine baseline cognitive functioning                                 General Comments: awake and alert on arrival, oriented to questions about self and maintains conversation, does fatigue easily and begins to close eyes      Exercises Total Joint Exercises Ankle Circles/Pumps: AROM;Strengthening;Both;10 reps (with manual resistance) Quad Sets: AROM;Strengthening;Both;10 reps Heel Slides:  AAROM;Strengthening;10 reps;Both (Low amplitude ROM with RLE) Hip ABduction/ADduction: AAROM;Strengthening;10 reps;Both Straight Leg Raises: AAROM;Strengthening;10 reps;Both Long Arc Quad: AROM;Strengthening;10 reps;Both Knee Flexion: AROM;Strengthening;Both;10 reps Other Exercises Other Exercises: Facilitated bed level grooming to increase pt activity  tolerance, fine motor strength and overal functional participation    General Comments        Pertinent Vitals/Pain Pain Assessment: Faces Pain Score: 9  Faces Pain Scale: Hurts a little bit Pain Location: R hip/thigh Pain Descriptors / Indicators: Grimacing;Aching;Sore Pain Intervention(s): Premedicated before session;Monitored during session    Home Living                      Prior Function            PT Goals (current goals can now be found in the care plan section) Acute Rehab PT Goals Patient Stated Goal: To be able to move better Progress towards PT goals: Not progressing toward goals - comment (Mobility held by MD this session)    Frequency    BID      PT Plan Current plan remains appropriate    Co-evaluation              AM-PAC PT "6 Clicks" Mobility   Outcome Measure  Help needed turning from your back to your side while in a flat bed without using bedrails?: A Lot Help needed moving from lying on your back to sitting on the side of a flat bed without using bedrails?: A Lot Help needed moving to and from a bed to a chair (including a wheelchair)?: Total Help needed standing up from a chair using your arms (e.g., wheelchair or bedside chair)?: A Lot Help needed to walk in hospital room?: Total Help needed climbing 3-5 steps with a railing? : Total 6 Click Score: 9    End of Session Equipment Utilized During Treatment: Gait belt Activity Tolerance: Patient tolerated treatment well Patient left: in bed;with call bell/phone within reach;with bed alarm set;with SCD's reapplied Nurse Communication: Mobility status PT Visit Diagnosis: Other abnormalities of gait and mobility (R26.89);Muscle weakness (generalized) (M62.81);History of falling (Z91.81);Difficulty in walking, not elsewhere classified (R26.2);Pain Pain - Right/Left: Right Pain - part of body: Hip     Time: 1642-1700 PT Time Calculation (min) (ACUTE ONLY): 18 min  Charges:   $Therapeutic Exercise: 8-22 mins $Therapeutic Activity: 8-22 mins                     D. Scott Najae Rathert PT, DPT 03/29/20, 5:14 PM

## 2020-03-30 ENCOUNTER — Inpatient Hospital Stay
Admit: 2020-03-30 | Discharge: 2020-03-30 | Disposition: A | Discharge status: Home / Self Care | Payer: Medicare Other | Attending: Internal Medicine | Admitting: Internal Medicine

## 2020-03-30 DIAGNOSIS — R778 Other specified abnormalities of plasma proteins: Secondary | ICD-10-CM

## 2020-03-30 DIAGNOSIS — Z7189 Other specified counseling: Secondary | ICD-10-CM

## 2020-03-30 DIAGNOSIS — I350 Nonrheumatic aortic (valve) stenosis: Secondary | ICD-10-CM

## 2020-03-30 DIAGNOSIS — I5031 Acute diastolic (congestive) heart failure: Secondary | ICD-10-CM

## 2020-03-30 DIAGNOSIS — I480 Paroxysmal atrial fibrillation: Secondary | ICD-10-CM

## 2020-03-30 DIAGNOSIS — Z515 Encounter for palliative care: Secondary | ICD-10-CM

## 2020-03-30 LAB — CBC
HCT: 29.5 % — ABNORMAL LOW (ref 36.0–46.0)
Hemoglobin: 9 g/dL — ABNORMAL LOW (ref 12.0–15.0)
MCH: 30.5 pg (ref 26.0–34.0)
MCHC: 30.5 g/dL (ref 30.0–36.0)
MCV: 100 fL (ref 80.0–100.0)
Platelets: 384 10*3/uL (ref 150–400)
RBC: 2.95 MIL/uL — ABNORMAL LOW (ref 3.87–5.11)
RDW: 19 % — ABNORMAL HIGH (ref 11.5–15.5)
WBC: 28.2 10*3/uL — ABNORMAL HIGH (ref 4.0–10.5)
nRBC: 5.3 % — ABNORMAL HIGH (ref 0.0–0.2)

## 2020-03-30 LAB — BASIC METABOLIC PANEL
Anion gap: 12 (ref 5–15)
BUN: 67 mg/dL — ABNORMAL HIGH (ref 8–23)
CO2: 21 mmol/L — ABNORMAL LOW (ref 22–32)
Calcium: 8.1 mg/dL — ABNORMAL LOW (ref 8.9–10.3)
Chloride: 104 mmol/L (ref 98–111)
Creatinine, Ser: 1.42 mg/dL — ABNORMAL HIGH (ref 0.44–1.00)
GFR calc Af Amer: 37 mL/min — ABNORMAL LOW (ref >60)
GFR calc non Af Amer: 32 mL/min — ABNORMAL LOW (ref >60)
Glucose, Bld: 129 mg/dL — ABNORMAL HIGH (ref 70–99)
Potassium: 4.2 mmol/L (ref 3.5–5.1)
Sodium: 137 mmol/L (ref 135–145)

## 2020-03-30 LAB — ECHOCARDIOGRAM COMPLETE
Height: 60 in
Weight: 1817.6 oz

## 2020-03-30 LAB — TROPONIN I (HIGH SENSITIVITY): Troponin I (High Sensitivity): 479 ng/L (ref <18)

## 2020-03-30 LAB — PATHOLOGIST SMEAR REVIEW

## 2020-03-30 MED ORDER — TORSEMIDE 20 MG PO TABS
10.0000 mg | ORAL_TABLET | Freq: Every day | ORAL | Status: DC
  Administered 2020-03-30 – 2020-04-02 (×4): 10 mg via ORAL
  Filled 2020-03-30 (×4): qty 1

## 2020-03-30 MED ORDER — ENSURE ENLIVE PO LIQD
237.0000 mL | Freq: Three times a day (TID) | ORAL | Status: DC
  Administered 2020-03-30 – 2020-04-02 (×4): 237 mL via ORAL

## 2020-03-30 MED ORDER — ADULT MULTIVITAMIN W/MINERALS CH
1.0000 | ORAL_TABLET | Freq: Every day | ORAL | Status: DC
  Administered 2020-03-31 – 2020-04-02 (×3): 1 via ORAL
  Filled 2020-03-30 (×3): qty 1

## 2020-03-30 NOTE — Progress Notes (Signed)
Physical Therapy Treatment Patient Details Name: Rebecca Cruz MRN: [id number] DOB: 03-05-1924 Today's Date: 03/30/2020    History of Present Illness Pt is a 86 y.o. female presenting to hospital 6/27 s/p fall (tripped and fell last night) sustaining R hip pain.  Imaging showing: "Displaced comminuted fracture through the right femoral intertrochanteric region. A nondisplaced fracture through the right inferior pubic ramus is not excluded."  Per MD Posey Pronto note (ortho): "We will additionally plan to treat possible right inferior pubic ramus fracture non-operatively as there are no other signs of significant pelvic ring injury."  Pt s/p IM nailing of R femur with cephalomedullary device 6/28 secondary R intertrochanteric hip fx.  Significant ecchymosis noted post-op; medical staff aware and monitoring. PMH includes htn, thyroid disease, aortic valve repair, appendectomy, abdominal hysterectomy, stage IV CKD, a-fib.    PT Comments    Of note, per cardiologist Dr. Saunders Revel ok for patient to participate with PT services but with activity levels to be progressed slowly.  OK for amb bed to chair if tolerated by pt.  Pt pleasant, motivated, to participate during the session and was more alert and engaged than prior sessions.  Pt continued to require significant physical assistance with bed mobility and transfers and presented with posterior lean when sitting at the EOB that improved somewhat as the session progressed.  The pt was able to lift her R foot from the floor multiple times but was unable to clear her L foot from the floor during multiple attempts at ambulation.  Each time the pt would attempt to lift her left foot her R knee would buckle likely secondary to pain.  Pt will benefit from PT services in a SNF setting upon discharge to safely address deficits listed in patient problem list for decreased caregiver assistance and eventual return to PLOF.     Follow Up Recommendations  SNF     Equipment  Recommendations  Rolling walker with 5" wheels;3in1 (PT)    Recommendations for Other Services       Precautions / Restrictions Precautions Precautions: Fall Restrictions: Progress activity slowly per cardiology; amb bed to chair ok Weight Bearing Restrictions: Yes RLE Weight Bearing: Weight bearing as tolerated    Mobility  Bed Mobility Overal bed mobility: Needs Assistance Bed Mobility: Supine to Sit;Sit to Supine     Supine to sit: Max assist Sit to supine: Max assist   General bed mobility comments: max assist for BLE and trunk control  Transfers Overall transfer level: Needs assistance Equipment used: Rolling walker (2 wheeled) Transfers: Sit to/from Stand Sit to Stand: Max assist         General transfer comment: patient needed cueing for anterior weight shift secondary to posterior lean sitting in bed  Ambulation/Gait             General Gait Details: unable to advance LLE   Stairs             Wheelchair Mobility    Modified Rankin (Stroke Patients Only)       Balance Overall balance assessment: Needs assistance Sitting-balance support: Bilateral upper extremity supported;Feet supported Sitting balance-Leahy Scale: Poor Sitting balance - Comments: min posterior lean sitting at EOB Postural control: Posterior lean Standing balance support: Bilateral upper extremity supported Standing balance-Leahy Scale: Poor                              Cognition Arousal/Alertness: Awake/alert Behavior During Therapy: [facility] for tasks  assessed/performed Overall Cognitive Status: No family/caregiver present to determine baseline cognitive functioning                                 General Comments: patient grossly more alert and oriented during today's session      Exercises Total Joint Exercises Ankle Circles/Pumps: AROM;Strengthening;Both;10 reps Quad Sets: Strengthening;Both;10 reps Gluteal Sets: Strengthening;Both;10  reps Hip ABduction/ADduction: Strengthening;Both;10 reps;AAROM Straight Leg Raises: AAROM;Strengthening;10 reps;Both Long Arc Quad: AROM;Strengthening;10 reps;Both Marching in Standing: Right;AROM;5 reps;Standing    General Comments        Pertinent Vitals/Pain Pain Assessment: 0-10 Pain Score: 2  Pain Location: R hip/thigh Pain Descriptors / Indicators: Discomfort Pain Intervention(s): Premedicated before session;Monitored during session    Home Living                      Prior Function            PT Goals (current goals can now be found in the care plan section) Progress towards PT goals: Progressing toward goals    Frequency    BID      PT Plan Current plan remains appropriate    Co-evaluation              AM-PAC PT "6 Clicks" Mobility   Outcome Measure  Help needed turning from your back to your side while in a flat bed without using bedrails?: A Lot Help needed moving from lying on your back to sitting on the side of a flat bed without using bedrails?: A Lot Help needed moving to and from a bed to a chair (including a wheelchair)?: Total Help needed standing up from a chair using your arms (e.g., wheelchair or bedside chair)?: A Lot Help needed to walk in hospital room?: Total Help needed climbing 3-5 steps with a railing? : Total 6 Click Score: 9    End of Session Equipment Utilized During Treatment: Gait belt Activity Tolerance: Patient limited by pain Patient left: in bed;with call bell/phone within reach;with bed alarm set;with SCD's reapplied Nurse Communication: Mobility status PT Visit Diagnosis: Other abnormalities of gait and mobility (R26.89);Muscle weakness (generalized) (M62.81);History of falling (Z91.81);Difficulty in walking, not elsewhere classified (R26.2);Pain Pain - Right/Left: Right Pain - part of body: Hip     Time: 8366-2947 PT Time Calculation (min) (ACUTE ONLY): 33 min  Charges:  $Therapeutic Exercise: 23-37  mins                     D. Scott Natausha Jungwirth PT, DPT 03/30/20, 3:58 PM

## 2020-03-30 NOTE — Consult Note (Signed)
Consultation Note Date: 03/30/2020   Patient Name: Rebecca Cruz  DOB: October 13, 1923  MRN: [id number]  Age / Sex: 86 y.o., female  PCP: Patient, No Pcp Per Referring Physician: Bonnielee Haff, MD  Reason for Consultation: Establishing goals of care  HPI/Patient Profile: Thecla Forgione is a 86 y.o. female with medical history significant for hypertension, hypothyroidism, stage IV chronic kidney disease and atrial fibrillation on chronic anticoagulation therapy who is visiting family in the area. She was brought in to the ER by EMS for evaluation of right hip pain.   Clinical Assessment and Goals of Care: Patient is resting in bed, no family at bedside. She has just finished working with therapy. She is widowed and has 2 children. She states she moved to [place] to live with her son. She is from [place]. She states she was here visiting a cousin. She uses a walker at baseline, but states she could do most things for herself prior to the fall. She states she no longer drives.   We discussed her diagnoses, prognosis, and GOC.  A detailed discussion was had today regarding advanced directives.  Concepts specific to code status, artifical feeding and hydration, IV antibiotics and rehospitalization were discussed.  The difference between an aggressive medical intervention path and a comfort care path was discussed.  Values and goals of care important to patient and family were attempted to be elicited.  Discussed limitations of medical interventions to prolong quality of life in some situations and discussed the concept of human mortality.  She states she wants to continue to treat the treatable, and wants to go home to [place], at least to a hospital there. She states her family is working out Restaurant manager, fast food. She states she would not want a feeding tube, ventilator support, or CPR. She adds " don't go overboard. I'm  89, it's time."     SUMMARY OF RECOMMENDATIONS   Palliative will continue to follow to speak with patient and family further.    Prognosis:   Unable to determine      Primary Diagnoses: Present on Admission: . Hypertension . Thyroid disease . Intertrochanteric fracture of right femur ([place]) . Atrial fibrillation with RVR ([place]) . Intertrochanteric fracture of right femur, closed, initial encounter ([place]) . Chronic kidney disease, stage 4 (severe) ([facility])   I have reviewed the medical record, interviewed the patient and family, and examined the patient. The following aspects are pertinent.  Past Medical History:  Diagnosis Date  . Hypertension   . Thyroid disease    Social History   Socioeconomic History  . Marital status: Significant Other    Spouse name: Not on file  . Number of children: Not on file  . Years of education: Not on file  . Highest education level: Not on file  Occupational History  . Not on file  Tobacco Use  . Smoking status: Never Smoker  . Smokeless tobacco: Never Used  Substance and Sexual Activity  . Alcohol use: Yes    Comment: once a week   .  Drug use: Not on file  . Sexual activity: Not on file  Other Topics Concern  . Not on file  Social History Narrative  . Not on file   Social Determinants of Health   Financial Resource Strain:   . Difficulty of Paying Living Expenses:   Food Insecurity:   . Worried About Charity fundraiser in the Last Year:   . Arboriculturist in the Last Year:   Transportation Needs:   . Film/video editor (Medical):   Marland Kitchen Lack of Transportation (Non-Medical):   Physical Activity:   . Days of Exercise per Week:   . Minutes of Exercise per Session:   Stress:   . Feeling of Stress :   Social Connections:   . Frequency of Communication with Friends and Family:   . Frequency of Social Gatherings with Friends and Family:   . Attends Religious Services:   . Active Member of Clubs or Organizations:   .  Attends Archivist Meetings:   Marland Kitchen Marital Status:    History reviewed. No pertinent family history. Scheduled Meds: . apixaban  2.5 mg Oral BID  . calcium acetate  667 mg Oral BID WC  . Chlorhexidine Gluconate Cloth  6 each Topical Daily  . docusate sodium  100 mg Oral BID  . feeding supplement (ENSURE ENLIVE)  237 mL Oral TID BM  . levothyroxine  75 mcg Oral Q0600  . metoprolol succinate  25 mg Oral Daily  . [START ON 03/31/2020] multivitamin with minerals  1 tablet Oral Daily  . polyethylene glycol  17 g Oral BID  . senna-docusate  2 tablet Oral Daily  . torsemide  10 mg Oral Daily   Continuous Infusions: . albumin human 12.5 g (03/30/20 1101)  . methocarbamol (ROBAXIN) IV     PRN Meds:.acetaminophen, bisacodyl, HYDROmorphone (DILAUDID) injection, methocarbamol **OR** methocarbamol (ROBAXIN) IV, ondansetron **OR** ondansetron (ZOFRAN) IV, sodium phosphate, traMADol Medications Prior to Admission:  Prior to Admission medications   Medication Sig Start Date End Date Taking? Authorizing Provider  acetaminophen (TYLENOL) 500 MG tablet Take 500 mg by mouth every 6 (six) hours as needed for headache.   Yes [provider]  calcium acetate (PHOSLO) 667 MG capsule Take 667 mg by mouth 2 (two) times daily.   Yes [provider]  cetirizine (ZYRTEC) 10 MG tablet Take 10 mg by mouth daily at 6 (six) AM. 08/25/17  Yes [provider]  ELIQUIS 2.5 MG TABS tablet Take 2.5 mg by mouth 2 (two) times daily. 03/01/20  Yes [provider]  levothyroxine (SYNTHROID) 75 MCG tablet Take 75 mcg by mouth daily. 03/01/20  Yes [provider]  losartan (COZAAR) 100 MG tablet Take 100 mg by mouth daily. 03/12/20  Yes [provider]  metoprolol succinate (TOPROL-XL) 50 MG 24 hr tablet Take 50 mg by mouth daily. 03/01/20  Yes [provider]  Multiple Vitamin (MULTIVITAMIN) capsule Take 1 capsule by mouth daily.   Yes [provider]    oxyCODONE (OXY IR/ROXICODONE) 5 MG immediate release tablet Take 0.5-1 tablets (2.5-5 mg total) by mouth every 4 (four) hours as needed for moderate pain or severe pain (pain score 4-6). 03/23/20   Reche [name], PA-C  traMADol (ULTRAM) 50 MG tablet Take 1 tablet (50 mg total) by mouth every 6 (six) hours as needed for moderate pain. 03/23/20   Reche [name], PA-C   Allergies  Allergen Reactions  . Codeine     With codeine  Review of Systems  Musculoskeletal:       Pain at op site.     Physical Exam Pulmonary:     Effort: Pulmonary effort is normal.  Neurological:     Mental Status: She is alert.     Vital Signs: BP (!) 96/52 (BP Location: Left Arm)   Pulse 79   Temp 98 F (36.7 C) (Oral)   Resp 18   Ht 5' (1.524 m)   Wt 51.5 kg   SpO2 98%   BMI 22.19 kg/m  Pain Scale: 0-10 POSS *See Group Information*: 1-Acceptable,Awake and alert Pain Score: 4    SpO2: SpO2: 98 % O2 Device:SpO2: 98 % O2 Flow Rate: .O2 Flow Rate (L/min): 10 L/min  IO: Intake/output summary:   Intake/Output Summary (Last 24 hours) at 03/30/2020 1536 Last data filed at 03/30/2020 1056 Gross per 24 hour  Intake 105.47 ml  Output 1850 ml  Net -1744.53 ml    LBM: Last BM Date: 03/29/20 Baseline Weight: Weight: 51.5 kg Most recent weight: Weight: 51.5 kg     Palliative Assessment/Data:     Time In: 3:20 Time Out: 3:50 Time Total: 30 min Greater than 50%  of this time was spent counseling and coordinating care related to the above assessment and plan.  Signed by: Asencion Gowda, NP   Please contact Palliative Medicine Team phone at [phone] for questions and concerns.  For individual provider: See Shea Evans
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
## 2020-03-30 NOTE — Progress Notes (Signed)
PT Cancellation Note  Patient Details Name: Rebecca Cruz MRN: [id number] DOB: 18-Nov-1923   Cancelled Treatment:    Reason Eval/Treat Not Completed: Medical issues which prohibited therapy: Per request by Dr. Saunders Revel from cardiology PT services held at this time with cardiology to assess pt later this date and advise further on appropriate activity parameters.  Will attempt to see pt at a future date/time as medically appropriate.      Linus Salmons PT, DPT 03/30/20, 9:32 AM
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
## 2020-03-30 NOTE — Progress Notes (Signed)
PROGRESS NOTE   Rebecca Cruz  DEY:[id number]    DOB: 08-11-1924    DOA: 03/21/2020  PCP: Patient, No Pcp Per   I have briefly reviewed patients previous medical records in [facility].  Chief Complaint  Patient presents with  . Fall    Brief Narrative:  86 year old female with PMH of hypertension, hypothyroidism, stage IV chronic kidney disease, atrial fibrillation on chronic anticoagulation, originally from [place], visiting family in the [place] area, presented to ED via EMS for evaluation of right hip pain.  S/p IM intertrochanteric nail on 6/28 for intertrochanteric fracture of right femur.  Hospital course complicated by ecchymosis of right medial thigh possibly related to surgical manipulation, some acute blood loss anemia, acute kidney injury, acute urinary retention, AMS and overall slow progress.  Hematology consulted for leukocytosis.  RLE ecchymosis, ABLA stabilized.  Creatinine fluctuating?  Element of cardiorenal syndrome.  Mild CHF/coughing-IV Lasix.  Urinary retention recurrent, Foley catheter placed back.  AMS better but not resolved.  Nephrology consulted due to acute kidney injury and cardiology for acute CHF.   Assessment & Plan:   Intertrochanteric fracture of right femur, s/p IM nail 6/28.  Sustained status post fall and had a displaced comminuted fracture.  Orthopedics following. WBAT to right lower extremity.  Although she may follow-up with [facility] orthopedics in 2 weeks for staple removal, since she is going to SNF in [place], will need to set up follow-up with orthopedics MD locally. Hospital course complicated by extensive right medial thigh bruising extending from mons pubis/groin mostly up to the back of the right knee in a bandlike fashion, some of it tracking faintly to the right ankle.   Per orthopedics, this was possibly related to some manipulation of the medial aspect of the extremity during surgery which may have precipitated some  bleeding.  Eliquis temporarily held for about 48 hours, resumed 7/3.  Pain management-judicious use of opioids.   Seems to be stable from an orthopedic standpoint.  Acute possibly diastolic CHF Noted to be in fluid overload on 7/3.  She was given Lasix.   TTE in care everywhere on 10/04/2018: LVEF 70+%, mild LVH.  Possible LV relaxation abnormality.  Moderate to severe TR.  Pulmonary hypertension.  PFO/ASD with left-to-right shunting.   Echocardiogram was repeated during this hospital stay and shows preserved EF.  No evidence for diastolic dysfunction noted.  Fluid overload is most likely due to third spacing.  Patient getting IV albumin per nephrology.    Elevated troponin Troponin is noted to be minimally elevated.  Cardiology is following.  Cardiogram does not show any wall motion abnormalities.  Mild LVH was noted.  Unlikely that any invasive interventions will be planned in this elderly patient.    AKI complicating stage IV chronic kidney disease:  Creatinine 0.9 on admission.  Since admission creatinine has been fluctuating up and down.  May be multifactorial due to fluctuating and poor oral intake, mild rhabdo and cardiorenal.  Briefly hydrated with IV fluids then stopped due to volume overload. Renal ultrasound without hydronephrosis. Subsequently nephrology was consulted.  Patient getting IV albumin and diuretics with good response and urine output.  Torsemide to be given today.  Follow electrolytes and renal function closely.   Permanent atrial fibrillation Continue reduced dose of Toprol-XL and Eliquis resumed after temporary hold for 48 hours.  No overt bleeding.  Essential hypertension Losartan held due to acute kidney injury.  Blood pressures have improved after cutting back Toprol-XL from 50 to 25  mg daily.  Hypothyroid Continue Synthroid.  Postop acute blood loss anemia Drop in hemoglobin likely due to surgical blood loss and bruising noted in the right lower extremity.  For  the past several days hemoglobin has been stable.  Transfuse if it drops below 7.    Leukocytosis WBC 18 on presentation which progressively increased, 22.3 > 26.7 > 32.6.?  Stress response.  No overt features of infectious cause.  As per family's report to prior MD, has history of elevated WBC.  Holding antibiotics. However review in care everywhere shows WBC in the 11-12 range in 2019 and 2020.  Appreciate hematology consultation and they do suspect that it is likely reactive.  Peripheral smear without concerning features.  Allergy review of the smear low concern for leukemia.  This may need further management in the outpatient setting.   Acute urinary retention, recurrent After 2 attempts of in and out catheterization last night, eventually had Foley catheter placed 7/1.  Foley catheter discontinued on 7/3.  But developed recurrent urinary retention and Foley catheter placed back 7/5. Persistent urinary retention most likely due to the fact that she has been nonambulatory for such a long period of time.  Would recommend that the Foley catheter be kept in until patient is able to get up and ambulate with physical therapy.  Acute metabolic encephalopathy: Likely multifactorial related to pain, pain medicines, hospitalization in a very elderly female patient who may have some underlying cognitive impairment.  Supportive care, minimize opioids, redirect.  Urine microscopy 7/1 without bacteria or nitrites.  Some pyuria.  Mental status has slowly improved but do not think she is back to baseline, even compared to last Wednesday  Body mass index is 22.19 kg/m.  Severe protein calorie malnutrition Nutrition Problem: Severe Malnutrition Etiology: chronic illness (CKD) Signs/Symptoms: severe fat depletion, severe muscle depletion Interventions: Refer to RD note for recommendations   Adult failure to thrive: Multifactorial due to advanced age and multiple severe acute and chronic comorbidities as above.   Discussed in detail with son and updated current status and how ill she is.  Palliative care medicine consulted for goals of care.  DVT prophylaxis: Eliquis Code Status: DNR Family Communication: We'll discuss with family later today. Disposition:  Status is: Inpatient  Remains inpatient appropriate because:Inpatient level of care appropriate due to severity of illness   Dispo: The patient is from: Home              Anticipated d/c is to: SNF              Anticipated d/c date is: 3 days.  Likely early next week              Patient currently is not medically stable to d/c.        Consultants:   Orthopedic Hematology Nephrology Cardiology Palliative  Procedures:   S/p right hip fracture IM nail 6/28 Foley catheter 7/1-7/3, 7/5 greater than  Antimicrobials:   None   Subjective:  Patient denies any complaints.  She denies any pain in the right hip area.  No chest pain or shortness of breath.  No nausea vomiting.  Objective:   Vitals:   03/29/20 0733 03/29/20 1518 03/29/20 1650 03/30/20 0723  BP: (!) 118/58 128/67 121/76 (!) 96/52  Pulse: 81 77  79  Resp: 19 18  18   Temp: 97.7 F (36.5 C) 97.8 F (36.6 C)  98 F (36.7 C)  TempSrc: Oral Oral  Oral  SpO2: 95% 97% 95%  98%  Weight:      Height:        General appearance: Awake alert.  In no distress.  Mildly distracted Resp: Clear to auscultation bilaterally.  Normal effort.  Diminished air entry at the bases Cardio: S1-S2 is normal regular.  No S3-S4.  No rubs murmurs or bruit GI: Abdomen is soft.  Nontender nondistended.  Bowel sounds are present normal.  No masses organomegaly Extremities: No edema.  Full range of motion of lower extremities. Neurologic:.  No focal neurological deficits.      Data Reviewed:   I have personally reviewed following labs and imaging studies   CBC: Recent Labs  Lab 03/25/20 0456 03/25/20 1501 03/28/20 0459 03/29/20 0454 03/30/20 0005  WBC 27.8*   < > 27.6* 29.2*  28.2*  NEUTROABS 20.0*  --   --   --   --   HGB 8.6*   < > 8.4* 8.8* 9.0*  HCT 27.6*   < > 28.2* 28.4* 29.5*  MCV 100.7*   < > 102.5* 97.9 100.0  PLT 375   < > 360 376 384   < > = values in this interval not displayed.    Basic Metabolic Panel: Recent Labs  Lab 03/24/20 0345 03/24/20 0345 03/25/20 0456 03/26/20 1448 03/28/20 0749 03/29/20 0454 03/30/20 0005  NA 138   < > 139   < > 139 138 137  K 4.5   < > 4.2   < > 5.0 5.0 4.2  CL 108   < > 109   < > 109 108 104  CO2 23   < > 22   < > 22 24 21*  GLUCOSE 119*   < > 106*   < > 103* 101* 129*  BUN 58*   < > 55*   < > 70* 75* 67*  CREATININE 1.27*   < > 1.14*   < > 1.32* 1.50* 1.42*  CALCIUM 7.7*   < > 7.8*   < > 8.2* 7.9* 8.1*  MG 2.0  --  2.0  --   --   --   --    < > = values in this interval not displayed.    Liver Function Tests: Recent Labs  Lab 03/29/20 0454  ALBUMIN 2.5*     Microbiology Studies:   Recent Results (from the past 240 hour(s))  SARS Coronavirus 2 by RT PCR (hospital order, performed in [facility] hospital lab) Nasopharyngeal Nasopharyngeal Swab     Status: None   Collection Time: 03/21/20  8:06 AM   Specimen: Nasopharyngeal Swab  Result Value Ref Range Status   SARS Coronavirus 2 NEGATIVE NEGATIVE Final    Comment: (NOTE) SARS-CoV-2 target nucleic acids are NOT DETECTED.  The SARS-CoV-2 RNA is generally detectable in upper and lower respiratory specimens during the acute phase of infection. The lowest concentration of SARS-CoV-2 viral copies this assay can detect is 250 copies / mL. A negative result does not preclude SARS-CoV-2 infection and should not be used as the sole basis for treatment or other patient management decisions.  A negative result may occur with improper specimen collection / handling, submission of specimen other than nasopharyngeal swab, presence of viral mutation(s) within the areas targeted by this assay, and inadequate number of viral copies (<250 copies / mL). A  negative result must be combined with clinical observations, patient history, and epidemiological information.  Fact Sheet for Patients:   StrictlyIdeas.no  Fact Sheet for Healthcare Providers: BankingDealers.co.za  This  test is not yet approved or  cleared by the [place] and has been authorized for detection and/or diagnosis of SARS-CoV-2 by FDA under an Emergency Use Authorization (EUA).  This EUA will remain in effect (meaning this test can be used) for the duration of the COVID-19 declaration under Section 564(b)(1) of the Act, 21 U.S.C. section 360bbb-3(b)(1), unless the authorization is terminated or revoked sooner.  Performed at [facility], [address], [place], [place] [address]   Surgical PCR screen     Status: None   Collection Time: 03/21/20 12:15 PM   Specimen: Nasal Mucosa; Nasal Swab  Result Value Ref Range Status   MRSA, PCR NEGATIVE NEGATIVE Final   Staphylococcus aureus NEGATIVE NEGATIVE Final    Comment: (NOTE) The Xpert SA Assay (FDA approved for NASAL specimens in patients 1 years of age and older), is one component of a comprehensive surveillance program. It is not intended to diagnose infection nor to guide or monitor treatment. Performed at [facility], [address] [place], [place] [address]      Radiology Studies:  ECHOCARDIOGRAM COMPLETE  Result Date: 03/30/2020    ECHOCARDIOGRAM REPORT   Patient Name:   Rebecca Cruz Date of Exam: 03/30/2020 Medical Rec #:  [id number]          Height:       60.0 in Accession #:    [id number]         Weight:       113.6 lb Date of Birth:  11/02/1923           BSA:          1.468 m Patient Age:    33 years           BP:           96/52 mmHg Patient Gender: F                  HR:           79 bpm. Exam Location:  [facility] Procedure: 2D Echo, Cardiac Doppler and Color Doppler Indications:     CHF-acute diastolic 992.42  History:          Patient has no prior history of Echocardiogram examinations.                  Risk Factors:Hypertension.  Sonographer:     Sherrie Sport RDCS (AE) Referring Phys:  [place] Diagnosing Phys: Bartholome Bill MD IMPRESSIONS  1. Left ventricular ejection fraction, by estimation, is 70 to 75%. The left ventricle has normal function. The left ventricle has no regional wall motion abnormalities. There is mild left ventricular hypertrophy. Left ventricular diastolic parameters were normal.  2. Right ventricular systolic function is normal. The right ventricular size is mildly enlarged. There is severely elevated pulmonary artery systolic pressure.  3. Right atrial size was mildly dilated.  4. The mitral valve is grossly normal. Mild mitral valve regurgitation.  5. Tricuspid valve regurgitation is moderate.  6. The aortic valve was not well visualized. Aortic valve regurgitation is trivial. Severe aortic valve stenosis. FINDINGS  Left Ventricle: Left ventricular ejection fraction, by estimation, is 70 to 75%. The left ventricle has normal function. The left ventricle has no regional wall motion abnormalities. The left ventricular internal cavity size was normal in size. There is  mild left ventricular hypertrophy. Left ventricular diastolic parameters were normal. Right Ventricle: The right ventricular size is mildly enlarged. No increase  in right ventricular wall thickness. Right ventricular systolic function is normal. There is severely elevated pulmonary artery systolic pressure. The tricuspid regurgitant velocity is 4.13 m/s, and with an assumed right atrial pressure of 10 mmHg, the estimated right ventricular systolic pressure is 37.1 mmHg. Left Atrium: Left atrial size was normal in size. Right Atrium: Right atrial size was mildly dilated. Pericardium: There is no evidence of pericardial effusion. Mitral Valve: The mitral valve is grossly normal. Mild mitral valve regurgitation. Tricuspid Valve: The tricuspid  valve is grossly normal. Tricuspid valve regurgitation is moderate. Aortic Valve: The aortic valve was not well visualized. Aortic valve regurgitation is trivial. Severe aortic stenosis is present. Aortic valve mean gradient measures 52.0 mmHg. Aortic valve peak gradient measures 81.5 mmHg. Aortic valve area, by VTI measures 0.46 cm. Pulmonic Valve: The pulmonic valve was not well visualized. Pulmonic valve regurgitation is not visualized. Aorta: The aortic root was not well visualized. IAS/Shunts: The interatrial septum was not assessed.  LEFT VENTRICLE PLAX 2D LVIDd:         3.55 cm LVIDs:         1.90 cm LV PW:         1.40 cm LV IVS:        0.91 cm LVOT diam:     1.90 cm LV SV:         46 LV SV Index:   31 LVOT Area:     2.84 cm  RIGHT VENTRICLE RV Basal diam:  4.23 cm RV S prime:     8.81 cm/s TAPSE (M-mode): 2.5 cm LEFT ATRIUM           Index       RIGHT ATRIUM           Index LA diam:      3.60 cm 2.45 cm/m  RA Area:     18.70 cm LA Vol (A2C): 42.5 ml 28.96 ml/m RA Volume:   62.10 ml  42.31 ml/m LA Vol (A4C): 49.1 ml 33.45 ml/m  AORTIC VALVE                    PULMONIC VALVE AV Area (Vmax):    0.47 cm     PV Vmax:        0.85 m/s AV Area (Vmean):   0.47 cm     PV Peak grad:   2.9 mmHg AV Area (VTI):     0.46 cm     RVOT Peak grad: 3 mmHg AV Vmax:           451.33 cm/s AV Vmean:          341.000 cm/s AV VTI:            1.001 m AV Peak Grad:      81.5 mmHg AV Mean Grad:      52.0 mmHg LVOT Vmax:         75.10 cm/s LVOT Vmean:        56.800 cm/s LVOT VTI:          0.161 m LVOT/AV VTI ratio: 0.16  AORTA Ao Root diam: 2.20 cm MITRAL VALVE                TRICUSPID VALVE MV Area (PHT): 2.92 cm     TR Peak grad:   68.2 mmHg MV Decel Time: 260 msec     TR Vmax:        413.00 cm/s MV E velocity: 136.00 cm/s  SHUNTS                             Systemic VTI:  0.16 m                             Systemic Diam: 1.90 cm Bartholome Bill MD Electronically signed by Bartholome Bill MD Signature  Date/Time: 03/30/2020/8:32:04 AM    Final      Scheduled Meds:   . apixaban  2.5 mg Oral BID  . calcium acetate  667 mg Oral BID WC  . Chlorhexidine Gluconate Cloth  6 each Topical Daily  . docusate sodium  100 mg Oral BID  . feeding supplement (ENSURE ENLIVE)  237 mL Oral BID BM  . levothyroxine  75 mcg Oral Q0600  . metoprolol succinate  25 mg Oral Daily  . polyethylene glycol  17 g Oral BID  . senna-docusate  2 tablet Oral Daily  . torsemide  10 mg Oral Daily    Continuous Infusions:   . albumin human 12.5 g (03/30/20 1101)  . methocarbamol (ROBAXIN) IV       LOS: 9 days     [facility]    To contact the attending provider between 7A-7P or the covering provider during after hours 7P-7A, please log into the web site [email or web address] and access using universal [place] password for that web site. If you do not have the password, please call the hospital operator.  03/30/2020, 1:45 PM

## 2020-03-30 NOTE — Progress Notes (Addendum)
  Subjective: 8 Days Post-Op Procedure(s) (LRB): INTRAMEDULLARY (IM) NAIL INTERTROCHANTRIC (Right) Patient reports pain as moderate.   Patient states pain is felt over the right thigh.  Denies nausea, vomiting. Denies numbness, tingling.   Objective: Vital signs in last 24 hours: Temp:  [98 F (36.7 C)] 98 F (36.7 C) (07/06 0723) Pulse Rate:  [79] 79 (07/06 0723) Resp:  [18] 18 (07/06 0723) BP: (96-121)/(52-76) 96/52 (07/06 0723) SpO2:  [95 %-98 %] 98 % (07/06 0723)  Intake/Output from previous day:  Intake/Output Summary (Last 24 hours) at 03/30/2020 1637 Last data filed at 03/30/2020 1056 Gross per 24 hour  Intake 105.47 ml  Output 1850 ml  Net -1744.53 ml    Intake/Output this shift: Total I/O In: -  Out: 350 [Urine:350]  Labs: Recent Labs    03/28/20 0459 03/29/20 0454 03/30/20 0005  HGB 8.4* 8.8* 9.0*   Recent Labs    03/29/20 0454 03/30/20 0005  WBC 29.2* 28.2*  RBC 2.90* 2.95*  HCT 28.4* 29.5*  PLT 376 384   Recent Labs    03/29/20 0454 03/30/20 0005  NA 138 137  K 5.0 4.2  CL 108 104  CO2 24 21*  BUN 75* 67*  CREATININE 1.50* 1.42*  GLUCOSE 101* 129*  CALCIUM 7.9* 8.1*   No results for input(s): LABPT, INR in the last 72 hours.   EXAM General - Patient is Alert, Appropriate and Oriented Extremity - Neurovascular intact Dorsiflexion/Plantar flexion intact Compartment soft; ecchymosis remains over mid to lower left back, lateral thigh, medial groin, extending down both sides of the right leg. No change from previous. Nontender to palpation. Dressing/Incision -compression dressing remains in place. No drainage noted from incisions. Motor Function - intact, moving foot and toes well on exam.    Assessment/Plan: 8 Days Post-Op Procedure(s) (LRB): INTRAMEDULLARY (IM) NAIL INTERTROCHANTRIC (Right) Principal Problem:   Intertrochanteric fracture of right femur (HCC) Active Problems:   Hypertension   Thyroid disease   Atrial fibrillation  with RVR (HCC)   Intertrochanteric fracture of right femur, closed, initial encounter ([place])   Chronic kidney disease, stage 4 (severe) (HCC)   Protein-calorie malnutrition, severe   Leukocytosis   Acute heart failure with preserved ejection fraction (HFpEF) (HCC)   Severe aortic stenosis   Elevated troponin  Estimated body mass index is 22.19 kg/m as calculated from the following:   Height as of this encounter: 5' (1.524 m).   Weight as of this encounter: 51.5 kg. Advance diet Up with therapy pending cardiac review.    DVT Prophylaxis - Eloquis, SCDs Weight-Bearing as tolerated to right leg  Cassell Smiles, PA-C [facility] 03/30/2020, 4:37 PM

## 2020-03-30 NOTE — Progress Notes (Signed)
*  PRELIMINARY RESULTS* Echocardiogram 2D Echocardiogram has been performed.  Rebecca Cruz 03/30/2020, 8:16 AM

## 2020-03-30 NOTE — Progress Notes (Signed)
[address] [place], [place] [address] Phone [phone]. Fax [phone]  Date: 03/30/2020                  Patient Name:  Rebecca Cruz  MRN: [id number]  DOB: [birth year]  Age / Sex: 87 y.o., female         PCP: Patient, No Pcp Per                 Service Requesting Consult: IM/ Bonnielee Haff, MD                 Reason for Consult: ARF            History of Present Illness: Patient is a 86 y.o. female with medical problems of hypertension, who was admitted to [facility] on 03/21/2020 for evaluation of right hip pain after fall.  She was diagnosed with a right hip fracture and underwent intramedullary nailing of right femur on June 28. Nephrology consult has been requested for rising creatinine as well as fluid overload  Patient was started on IV albumin yesterday.  Urine output has increased to 2000 cc Peripheral edema appears to be slightly better Patient's niece is at bedside    Current medications: Current Facility-Administered Medications  Medication Dose Route Frequency Provider Last Rate Last Admin  . acetaminophen (TYLENOL) tablet 650 mg  650 mg Oral Q6H PRN Modena Jansky, MD   650 mg at 03/28/20 2355  . albumin human 25 % solution 12.5 g  12.5 g Intravenous BID Murlean Iba, MD 60 mL/hr at 03/29/20 2301 12.5 g at 03/29/20 2301  . apixaban (ELIQUIS) tablet 2.5 mg  2.5 mg Oral BID Modena Jansky, MD   2.5 mg at 03/29/20 2254  . bisacodyl (DULCOLAX) suppository 10 mg  10 mg Rectal Daily PRN Leim Fabry, MD   10 mg at 03/25/20 1000  . calcium acetate (PHOSLO) capsule 667 mg  667 mg Oral BID WC Leim Fabry, MD   667 mg at 03/28/20 1727  . Chlorhexidine Gluconate Cloth 2 % PADS 6 each  6 each Topical Daily Enzo Bi, MD   6 each at 03/26/20 1119  . docusate sodium (COLACE) capsule 100 mg  100 mg Oral BID Leim Fabry, MD   100 mg at 03/29/20 2301  . feeding supplement (ENSURE ENLIVE) (ENSURE ENLIVE) liquid 237 mL  237 mL Oral BID BM Enzo Bi, MD    237 mL at 03/29/20 1438  . HYDROmorphone (DILAUDID) injection 0.5 mg  0.5 mg Intravenous Q8H PRN Modena Jansky, MD   0.5 mg at 03/28/20 0148  . levothyroxine (SYNTHROID) tablet 75 mcg  75 mcg Oral Q0600 Leim Fabry, MD   75 mcg at 03/30/20 [phone]  . methocarbamol (ROBAXIN) tablet 500 mg  500 mg Oral Q6H PRN Leim Fabry, MD   500 mg at 03/25/20 0454   Or  . methocarbamol (ROBAXIN) 500 mg in dextrose 5 % 50 mL IVPB  500 mg Intravenous Q6H PRN Leim Fabry, MD      . metoprolol succinate (TOPROL-XL) 24 hr tablet 25 mg  25 mg Oral Daily Modena Jansky, MD   25 mg at 03/29/20 1020  . ondansetron (ZOFRAN) tablet 4 mg  4 mg Oral Q6H PRN Leim Fabry, MD       Or  . ondansetron [facility]) injection 4 mg  4 mg Intravenous Q6H PRN Leim Fabry, MD   4 mg at 03/27/20 2111  . polyethylene glycol (MIRALAX / GLYCOLAX) packet 17  g  17 g Oral BID Modena Jansky, MD   17 g at 03/29/20 2254  . senna-docusate (Senokot-S) tablet 2 tablet  2 tablet Oral Daily Modena Jansky, MD   2 tablet at 03/28/20 [phone]  . sodium phosphate (FLEET) 7-19 GM/118ML enema 1 enema  1 enema Rectal Once PRN Leim Fabry, MD      . torsemide [facility]) tablet 10 mg  10 mg Oral Daily Murlean Iba, MD      . traMADol (ULTRAM) tablet 50 mg  50 mg Oral Q12H PRN Modena Jansky, MD   50 mg at 03/29/20 1724     Vital Signs: Blood pressure (!) 96/52, pulse 79, temperature 98 F (36.7 C), temperature source Oral, resp. rate 18, height 5' (1.524 m), weight 51.5 kg, SpO2 98 %.   Intake/Output Summary (Last 24 hours) at 03/30/2020 1100 Last data filed at 03/30/2020 1056 Gross per 24 hour  Intake 105.47 ml  Output 2350 ml  Net -2244.53 ml    Weight trends: [employer]   03/21/20 2620  Weight: 51.5 kg    Physical Exam: General:  Frail, elderly woman, laying in the bed  HEENT  dry oral mucous membranes  Neck:  No JVD  Lungs:  Normal breathing effort on room air, mild fine crackles  Heart::  No rub  Abdomen:  Soft,  nontender, nondistended  Extremities:  Some dependent edema on the thighs and arms,  Neurologic:  Alert, able to answer few simple questions  Skin:  Scattered ecchymosis, redness over fluid collection near the left elbow    Lab results: Basic Metabolic Panel: Recent Labs  Lab 03/24/20 0345 03/24/20 0345 03/25/20 0456 03/26/20 3559 03/28/20 0749 03/29/20 0454 03/30/20 0005  NA 138   < > 139   < > 139 138 137  K 4.5   < > 4.2   < > 5.0 5.0 4.2  CL 108   < > 109   < > 109 108 104  CO2 23   < > 22   < > 22 24 21*  GLUCOSE 119*   < > 106*   < > 103* 101* 129*  BUN 58*   < > 55*   < > 70* 75* 67*  CREATININE 1.27*   < > 1.14*   < > 1.32* 1.50* 1.42*  CALCIUM 7.7*   < > 7.8*   < > 8.2* 7.9* 8.1*  MG 2.0  --  2.0  --   --   --   --    < > = values in this interval not displayed.    Liver Function Tests: Recent Labs  Lab 03/29/20 0454  ALBUMIN 2.5*   No results for input(s): LIPASE, AMYLASE in the last 168 hours. No results for input(s): AMMONIA in the last 168 hours.  CBC: Recent Labs  Lab 03/25/20 0456 03/25/20 1501 03/29/20 0454 03/30/20 0005  WBC 27.8*   < > 29.2* 28.2*  NEUTROABS 20.0*  --   --   --   HGB 8.6*   < > 8.8* 9.0*  HCT 27.6*   < > 28.4* 29.5*  MCV 100.7*   < > 97.9 100.0  PLT 375   < > 376 384   < > = values in this interval not displayed.    Cardiac Enzymes: Recent Labs  Lab 03/27/20 0904  CKTOTAL 487*    BNP: Invalid input(s): POCBNP  CBG: No results for input(s): GLUCAP in the last 168 hours.  Microbiology: Recent Results (from the past 720 hour(s))  SARS Coronavirus 2 by RT PCR (hospital order, performed in [facility] hospital lab) Nasopharyngeal Nasopharyngeal Swab     Status: None   Collection Time: 03/21/20  8:06 AM   Specimen: Nasopharyngeal Swab  Result Value Ref Range Status   SARS Coronavirus 2 NEGATIVE NEGATIVE Final    Comment: (NOTE) SARS-CoV-2 target nucleic acids are NOT DETECTED.  The SARS-CoV-2 RNA is generally  detectable in upper and lower respiratory specimens during the acute phase of infection. The lowest concentration of SARS-CoV-2 viral copies this assay can detect is 250 copies / mL. A negative result does not preclude SARS-CoV-2 infection and should not be used as the sole basis for treatment or other patient management decisions.  A negative result may occur with improper specimen collection / handling, submission of specimen other than nasopharyngeal swab, presence of viral mutation(s) within the areas targeted by this assay, and inadequate number of viral copies (<250 copies / mL). A negative result must be combined with clinical observations, patient history, and epidemiological information.  Fact Sheet for Patients:   StrictlyIdeas.no  Fact Sheet for Healthcare Providers: BankingDealers.co.za  This test is not yet approved or  cleared by the [place] FDA and has been authorized for detection and/or diagnosis of SARS-CoV-2 by FDA under an Emergency Use Authorization (EUA).  This EUA will remain in effect (meaning this test can be used) for the duration of the COVID-19 declaration under Section 564(b)(1) of the Act, 21 U.S.C. section 360bbb-3(b)(1), unless the authorization is terminated or revoked sooner.  Performed at [facility], [address], [place], [place] [address]   Surgical PCR screen     Status: None   Collection Time: 03/21/20 12:15 PM   Specimen: Nasal Mucosa; Nasal Swab  Result Value Ref Range Status   MRSA, PCR NEGATIVE NEGATIVE Final   Staphylococcus aureus NEGATIVE NEGATIVE Final    Comment: (NOTE) The Xpert SA Assay (FDA approved for NASAL specimens in patients 10 years of age and older), is one component of a comprehensive surveillance program. It is not intended to diagnose infection nor to guide or monitor treatment. Performed at [facility], [address]  [place], [place] [address]      Coagulation Studies: No results for input(s): LABPROT, INR in the last 72 hours.  Urinalysis: No results for input(s): COLORURINE, LABSPEC, PHURINE, GLUCOSEU, HGBUR, BILIRUBINUR, KETONESUR, PROTEINUR, UROBILINOGEN, NITRITE, LEUKOCYTESUR in the last 72 hours.  Invalid input(s): APPERANCEUR      Imaging: ECHOCARDIOGRAM COMPLETE  Result Date: 03/30/2020    ECHOCARDIOGRAM REPORT   Patient Name:   Rebecca Cruz Date of Exam: 03/30/2020 Medical Rec #:  [id number]          Height:       60.0 in Accession #:    [id number]         Weight:       113.6 lb Date of Birth:  1923-12-06           BSA:          1.468 m Patient Age:    16 years           BP:           96/52 mmHg Patient Gender: F                  HR:           79 bpm. Exam Location:  [facility] Procedure: 2D  Echo, Cardiac Doppler and Color Doppler Indications:     CHF-acute diastolic 627.03  History:         Patient has no prior history of Echocardiogram examinations.                  Risk Factors:Hypertension.  Sonographer:     Sherrie Sport RDCS (AE) Referring Phys:  [place] Diagnosing Phys: Bartholome Bill MD IMPRESSIONS  1. Left ventricular ejection fraction, by estimation, is 70 to 75%. The left ventricle has normal function. The left ventricle has no regional wall motion abnormalities. There is mild left ventricular hypertrophy. Left ventricular diastolic parameters were normal.  2. Right ventricular systolic function is normal. The right ventricular size is mildly enlarged. There is severely elevated pulmonary artery systolic pressure.  3. Right atrial size was mildly dilated.  4. The mitral valve is grossly normal. Mild mitral valve regurgitation.  5. Tricuspid valve regurgitation is moderate.  6. The aortic valve was not well visualized. Aortic valve regurgitation is trivial. Severe aortic valve stenosis. FINDINGS  Left Ventricle: Left ventricular ejection fraction, by estimation, is 70 to 75%. The left  ventricle has normal function. The left ventricle has no regional wall motion abnormalities. The left ventricular internal cavity size was normal in size. There is  mild left ventricular hypertrophy. Left ventricular diastolic parameters were normal. Right Ventricle: The right ventricular size is mildly enlarged. No increase in right ventricular wall thickness. Right ventricular systolic function is normal. There is severely elevated pulmonary artery systolic pressure. The tricuspid regurgitant velocity is 4.13 m/s, and with an assumed right atrial pressure of 10 mmHg, the estimated right ventricular systolic pressure is 50.0 mmHg. Left Atrium: Left atrial size was normal in size. Right Atrium: Right atrial size was mildly dilated. Pericardium: There is no evidence of pericardial effusion. Mitral Valve: The mitral valve is grossly normal. Mild mitral valve regurgitation. Tricuspid Valve: The tricuspid valve is grossly normal. Tricuspid valve regurgitation is moderate. Aortic Valve: The aortic valve was not well visualized. Aortic valve regurgitation is trivial. Severe aortic stenosis is present. Aortic valve mean gradient measures 52.0 mmHg. Aortic valve peak gradient measures 81.5 mmHg. Aortic valve area, by VTI measures 0.46 cm. Pulmonic Valve: The pulmonic valve was not well visualized. Pulmonic valve regurgitation is not visualized. Aorta: The aortic root was not well visualized. IAS/Shunts: The interatrial septum was not assessed.  LEFT VENTRICLE PLAX 2D LVIDd:         3.55 cm LVIDs:         1.90 cm LV PW:         1.40 cm LV IVS:        0.91 cm LVOT diam:     1.90 cm LV SV:         46 LV SV Index:   31 LVOT Area:     2.84 cm  RIGHT VENTRICLE RV Basal diam:  4.23 cm RV S prime:     8.81 cm/s TAPSE (M-mode): 2.5 cm LEFT ATRIUM           Index       RIGHT ATRIUM           Index LA diam:      3.60 cm 2.45 cm/m  RA Area:     18.70 cm LA Vol (A2C): 42.5 ml 28.96 ml/m RA Volume:   62.10 ml  42.31 ml/m LA Vol  (A4C): 49.1 ml 33.45 ml/m  AORTIC VALVE  PULMONIC VALVE AV Area (Vmax):    0.47 cm     PV Vmax:        0.85 m/s AV Area (Vmean):   0.47 cm     PV Peak grad:   2.9 mmHg AV Area (VTI):     0.46 cm     RVOT Peak grad: 3 mmHg AV Vmax:           451.33 cm/s AV Vmean:          341.000 cm/s AV VTI:            1.001 m AV Peak Grad:      81.5 mmHg AV Mean Grad:      52.0 mmHg LVOT Vmax:         75.10 cm/s LVOT Vmean:        56.800 cm/s LVOT VTI:          0.161 m LVOT/AV VTI ratio: 0.16  AORTA Ao Root diam: 2.20 cm MITRAL VALVE                TRICUSPID VALVE MV Area (PHT): 2.92 cm     TR Peak grad:   68.2 mmHg MV Decel Time: 260 msec     TR Vmax:        413.00 cm/s MV E velocity: 136.00 cm/s                             SHUNTS                             Systemic VTI:  0.16 m                             Systemic Diam: 1.90 cm Bartholome Bill MD Electronically signed by Bartholome Bill MD Signature Date/Time: 03/30/2020/8:32:04 AM    Final      Assessment & Plan: Pt is a 86 y.o.   female with HTN, hypothyroidosm, A Fib, CKD, was admitted on 03/21/2020 with Fall [W19.XXXA] Closed fracture of right hip, initial encounter [facility]) [S72.001A] Intertrochanteric fracture of right femur, closed, initial encounter ([place]) [S72.141A]  #Acute kidney injury Baseline creatinine of 0.93 from March 22, 2020 Creatinine has been fluctuating and has been increasing for the past few days from 1.1 to peak of 1.5   Urinalysis from July 1 shows moderate blood with 11-20 RBCs, 11-20 WBCs with mild protein. Renal ultrasound from July 4 shows kidneys at lower limits of normal size, left smaller than right.  No hydronephrosis.  Slightly increased right renal echogenicity.  Small bilateral pleural effusions Acute kidney injury is likely secondary to volume shifts -Avoid nephrotoxins, IV contrast, nonsteroidals -Avoid hypotension -Serum creatinine trend has started to improve with IV albumin supplementation  #Volume  overload Likely third spacing of fluid due to low albumin (of 2.5) likely Secondary to poor nutrition.  Boost or ensure supplements Continue to supplement IV albumin at low-dose of 12.5 g twice a day Monitor volume and pulmonary status closely Start low-dose torsemide We will follow      LOS: 9 Kristyn Obyrne 7/6/202111:00 AM    Note: This note was prepared with Dragon dictation. Any transcription errors are unintentional

## 2020-03-30 NOTE — Progress Notes (Signed)
Progress Note  Patient Name: Rebecca Cruz Date of Encounter: 03/30/2020  Primary Cardiologist: New  Subjective   Reports fatigue and difficulty breathing. SOB at rest started last night. She has not been up and out of bed or moving around. She denies any chest pain or significant racing HR, palpitations. No dizziness reported.   Inpatient Medications    Scheduled Meds: . apixaban  2.5 mg Oral BID  . calcium acetate  667 mg Oral BID WC  . Chlorhexidine Gluconate Cloth  6 each Topical Daily  . docusate sodium  100 mg Oral BID  . feeding supplement (ENSURE ENLIVE)  237 mL Oral TID BM  . levothyroxine  75 mcg Oral Q0600  . metoprolol succinate  25 mg Oral Daily  . [START ON 03/31/2020] multivitamin with minerals  1 tablet Oral Daily  . polyethylene glycol  17 g Oral BID  . senna-docusate  2 tablet Oral Daily  . torsemide  10 mg Oral Daily   Continuous Infusions: . albumin human 12.5 g (03/30/20 1101)  . methocarbamol (ROBAXIN) IV     PRN Meds: acetaminophen, bisacodyl, HYDROmorphone (DILAUDID) injection, methocarbamol **OR** methocarbamol (ROBAXIN) IV, ondansetron **OR** ondansetron (ZOFRAN) IV, sodium phosphate, traMADol   Vital Signs    Vitals:   03/29/20 0733 03/29/20 1518 03/29/20 1650 03/30/20 0723  BP: (!) 118/58 128/67 121/76 (!) 96/52  Pulse: 81 77  79  Resp: _0 Temp: 97.7 F (36.5 C) 97.8 F (36.6 C)  98 F (36.7 C)  TempSrc: Oral Oral  Oral  SpO2: 95% 97% 95% 98%  Weight:      Height:        Intake/Output Summary (Last 24 hours) at 03/30/2020 1600 Last data filed at 03/30/2020 1056 Gross per 24 hour  Intake 105.47 ml  Output 1850 ml  Net -1744.53 ml   Last 3 Weights 03/21/2020  Weight (lbs) 113 lb 9.6 oz  Weight (kg) 51.529 kg      Telemetry    Afib with rates in the 70-80s, PVCs versus aberrancy. - Personally Reviewed  ECG    No new tracings - Personally Reviewed  Physical Exam   GEN: Frail and elderly female. No acute distress.    Neck: No JVD or HJR Cardiac: Irregularly irregular with 2/6 systolic murmur. Respiratory: : Bibasilar crackles with decreased breath sounds at the lung bases. GI: Soft, nontender, non-distended  MS: Bilateral SCDs, No edema; No deformity. Neuro:  Nonfocal  Psych: Normal affect   Labs    High Sensitivity Troponin:   Recent Labs  Lab 03/29/20 1535 03/29/20 1750 03/29/20 2205 03/30/20 0005  TROPONINIHS 458* 517* 506* 479*      Chemistry Recent Labs  Lab 03/28/20 0749 03/29/20 0454 03/30/20 0005  NA 139 138 137  K 5.0 5.0 4.2  CL 109 108 104  CO2 22 24 21*  GLUCOSE 103* 101* 129*  BUN 70* 75* 67*  CREATININE 1.32* 1.50* 1.42*  CALCIUM 8.2* 7.9* 8.1*  ALBUMIN  --  2.5*  --   GFRNONAA 34* 30* 32*  GFRAA 40* 34* 37*  ANIONGAP _1 Hematology Recent Labs  Lab 03/28/20 0459 03/29/20 0454 03/30/20 0005  WBC 27.6* 29.2* 28.2*  RBC 2.75* 2.90* 2.95*  HGB 8.4* 8.8* 9.0*  HCT 28.2* 28.4* 29.5*  MCV 102.5* 97.9 100.0  MCH 30.5 30.3 30.5  MCHC 29.8* 31.0 30.5  RDW 17.6* 18.3* 19.0*  PLT 360 376 384    BNP  Recent Labs  Lab 03/29/20 0454  BNP 1,611.0*     DDimer No results for input(s): DDIMER in the last 168 hours.   Radiology    ECHOCARDIOGRAM COMPLETE  Result Date: 03/30/2020    ECHOCARDIOGRAM REPORT   Patient Name:   DARLING CIESLEWICZ Date of Exam: 03/30/2020 Medical Rec #:  [id number]          Height:       60.0 in Accession #:    [id number]         Weight:       113.6 lb Date of Birth:  [birth year]           BSA:          1.468 m Patient Age:    86 years           BP:           96/52 mmHg Patient Gender: F                  HR:           79 bpm. Exam Location:  [facility] Procedure: 2D Echo, Cardiac Doppler and Color Doppler Indications:     CHF-acute diastolic 665.99  History:         Patient has no prior history of Echocardiogram examinations.                  Risk Factors:Hypertension.  Sonographer:     Sherrie Sport RDCS (AE) Referring Phys:  [place] Diagnosing Phys: Bartholome Bill MD IMPRESSIONS  1. Left ventricular ejection fraction, by estimation, is 70 to 75%. The left ventricle has normal function. The left ventricle has no regional wall motion abnormalities. There is mild left ventricular hypertrophy. Left ventricular diastolic parameters were normal.  2. Right ventricular systolic function is normal. The right ventricular size is mildly enlarged. There is severely elevated pulmonary artery systolic pressure.  3. Right atrial size was mildly dilated.  4. The mitral valve is grossly normal. Mild mitral valve regurgitation.  5. Tricuspid valve regurgitation is moderate.  6. The aortic valve was not well visualized. Aortic valve regurgitation is trivial. Severe aortic valve stenosis. FINDINGS  Left Ventricle: Left ventricular ejection fraction, by estimation, is 70 to 75%. The left ventricle has normal function. The left ventricle has no regional wall motion abnormalities. The left ventricular internal cavity size was normal in size. There is  mild left ventricular hypertrophy. Left ventricular diastolic parameters were normal. Right Ventricle: The right ventricular size is mildly enlarged. No increase in right ventricular wall thickness. Right ventricular systolic function is normal. There is severely elevated pulmonary artery systolic pressure. The tricuspid regurgitant velocity is 4.13 m/s, and with an assumed right atrial pressure of 10 mmHg, the estimated right ventricular systolic pressure is 35.7 mmHg. Left Atrium: Left atrial size was normal in size. Right Atrium: Right atrial size was mildly dilated. Pericardium: There is no evidence of pericardial effusion. Mitral Valve: The mitral valve is grossly normal. Mild mitral valve regurgitation. Tricuspid Valve: The tricuspid valve is grossly normal. Tricuspid valve regurgitation is moderate. Aortic Valve: The aortic valve was not well visualized. Aortic valve regurgitation is trivial. Severe aortic  stenosis is present. Aortic valve mean gradient measures 52.0 mmHg. Aortic valve peak gradient measures 81.5 mmHg. Aortic valve area, by VTI measures 0.46 cm. Pulmonic Valve: The pulmonic valve was not well visualized. Pulmonic valve regurgitation is not visualized. Aorta: The aortic root was not well visualized.  IAS/Shunts: The interatrial septum was not assessed.  LEFT VENTRICLE PLAX 2D LVIDd:         3.55 cm LVIDs:         1.90 cm LV PW:         1.40 cm LV IVS:        0.91 cm LVOT diam:     1.90 cm LV SV:         46 LV SV Index:   31 LVOT Area:     2.84 cm  RIGHT VENTRICLE RV Basal diam:  4.23 cm RV S prime:     8.81 cm/s TAPSE (M-mode): 2.5 cm LEFT ATRIUM           Index       RIGHT ATRIUM           Index LA diam:      3.60 cm 2.45 cm/m  RA Area:     18.70 cm LA Vol (A2C): 42.5 ml 28.96 ml/m RA Volume:   62.10 ml  42.31 ml/m LA Vol (A4C): 49.1 ml 33.45 ml/m  AORTIC VALVE                    PULMONIC VALVE AV Area (Vmax):    0.47 cm     PV Vmax:        0.85 m/s AV Area (Vmean):   0.47 cm     PV Peak grad:   2.9 mmHg AV Area (VTI):     0.46 cm     RVOT Peak grad: 3 mmHg AV Vmax:           451.33 cm/s AV Vmean:          341.000 cm/s AV VTI:            1.001 m AV Peak Grad:      81.5 mmHg AV Mean Grad:      52.0 mmHg LVOT Vmax:         75.10 cm/s LVOT Vmean:        56.800 cm/s LVOT VTI:          0.161 m LVOT/AV VTI ratio: 0.16  AORTA Ao Root diam: 2.20 cm MITRAL VALVE                TRICUSPID VALVE MV Area (PHT): 2.92 cm     TR Peak grad:   68.2 mmHg MV Decel Time: 260 msec     TR Vmax:        413.00 cm/s MV E velocity: 136.00 cm/s                             SHUNTS                             Systemic VTI:  0.16 m                             Systemic Diam: 1.90 cm Bartholome Bill MD Electronically signed by Bartholome Bill MD Signature Date/Time: 03/30/2020/8:32:04 AM    Final     Cardiac Studies   Echo 03/30/20 1. Left ventricular ejection fraction, by estimation, is 70 to 75%. The  left ventricle has  normal function. The left ventricle has no regional  wall motion abnormalities. There is mild left ventricular hypertrophy.  Left ventricular diastolic parameters  were normal.  2. Right ventricular systolic function is normal. The right ventricular  size is mildly enlarged. There is severely elevated pulmonary artery  systolic pressure.  3. Right atrial size was mildly dilated.  4. The mitral valve is grossly normal. Mild mitral valve regurgitation.  5. Tricuspid valve regurgitation is moderate.  6. The aortic valve was not well visualized. Aortic valve regurgitation  is trivial. Severe aortic valve stenosis.    Echo on 10/04/2018 demonstrated EF >70%, mild LVH, marked LAE, mild RAE, mod AS of bioprosthetic valve, trivial regurgitation of bioprosthetic valve, mild MAC, mild to moderate MR, moderate to severe TR, trace PI, PFO/ASD left to right shunt, similar to prior study.     Patient Profile     86 y.o. female with history of permanent Afib, HFpEF, HTN, moderate TR/AS s/p AVR, and monitored for HFpEF and Afib s/p hip fx s/p 6/28 repair.  Assessment & Plan    Acute HFpEF: Echo shows hyperdynamic left ventricular systolic function with severe stenosis of bioprosthetic aortic valve, likely contributing to acute heart failure exacerbation.  Ms. Fair still appears volume overloaded on exam and was noted to have severe pulmonary hypertension by echo; she would benefit from further diuresis.  Good urine output noted yesterday; it appears that the patient has been switched to torsemide today.  Continue torsemide for now, though I would favor transitioning back to IV furosemide if renal function blood pressure tolerate.  Of note, renal function is stable despite aggressive diuresis yesterday.  Continue metoprolol tartrate 25 mg daily.  Permanent atrial fibrillation: Heart rate reasonably well controlled.  Continue metoprolol succinate 25 mg daily.  Continue apixaban 2.5 mg  twice daily (adjusted for weight and age).  Aortic stenosis: Patient status post bioprosthetic AVR.  Echo this morning shows severe stenosis and mild regurgitation of the prosthesis.  This is likely contributing to HFpEF.  Diuresis, as above.  Patient would benefit from further conversations regarding her goals of care.  She seems confused today and is unable to answer where she is or where she normally lives.  Valve in valve TAVR is theoretically an option, though I worry that the patient's comorbidities might make her a suboptimal candidate.  It would be reasonable to involve palliative care.  Elevated troponin: Relatively flat and most likely representative of supply-demand mismatch in the setting of acute HFpEF and renal insufficiency.  No further intervention at this time.  Continue apixaban 2.5 mg twice daily in lieu of aspirin.  Hip fracture:  Per internal medicine and orthopedics.  For questions or updates, please contact [place] Please consult [email or web address] for contact info under [facility].   Signed, Nelva Bush, MD  03/30/2020, 4:00 PM

## 2020-03-31 ENCOUNTER — Inpatient Hospital Stay: Payer: Medicare Other

## 2020-03-31 LAB — BASIC METABOLIC PANEL
Anion gap: 10 (ref 5–15)
BUN: 69 mg/dL — ABNORMAL HIGH (ref 8–23)
CO2: 25 mmol/L (ref 22–32)
Calcium: 8.3 mg/dL — ABNORMAL LOW (ref 8.9–10.3)
Chloride: 104 mmol/L (ref 98–111)
Creatinine, Ser: 1.24 mg/dL — ABNORMAL HIGH (ref 0.44–1.00)
GFR calc Af Amer: 43 mL/min — ABNORMAL LOW (ref >60)
GFR calc non Af Amer: 37 mL/min — ABNORMAL LOW (ref >60)
Glucose, Bld: 100 mg/dL — ABNORMAL HIGH (ref 70–99)
Potassium: 4.1 mmol/L (ref 3.5–5.1)
Sodium: 139 mmol/L (ref 135–145)

## 2020-03-31 LAB — COMP PANEL: LEUKEMIA/LYMPHOMA

## 2020-03-31 LAB — CBC
HCT: 28.3 % — ABNORMAL LOW (ref 36.0–46.0)
Hemoglobin: 8.6 g/dL — ABNORMAL LOW (ref 12.0–15.0)
MCH: 30.7 pg (ref 26.0–34.0)
MCHC: 30.4 g/dL (ref 30.0–36.0)
MCV: 101.1 fL — ABNORMAL HIGH (ref 80.0–100.0)
Platelets: 312 10*3/uL (ref 150–400)
RBC: 2.8 MIL/uL — ABNORMAL LOW (ref 3.87–5.11)
RDW: 19.3 % — ABNORMAL HIGH (ref 11.5–15.5)
WBC: 18.2 10*3/uL — ABNORMAL HIGH (ref 4.0–10.5)
nRBC: 1.4 % — ABNORMAL HIGH (ref 0.0–0.2)

## 2020-03-31 LAB — SARS CORONAVIRUS 2 BY RT PCR (HOSPITAL ORDER, PERFORMED IN ~~LOC~~ HOSPITAL LAB): SARS Coronavirus 2: NEGATIVE

## 2020-03-31 LAB — MAGNESIUM: Magnesium: 2.1 mg/dL (ref 1.7–2.4)

## 2020-03-31 MED ORDER — METOPROLOL SUCCINATE ER 25 MG PO TB24: 25.0000 mg | ORAL_TABLET | Freq: Every day | ORAL | Status: AC

## 2020-03-31 MED ORDER — ENSURE ENLIVE PO LIQD: 237.0000 mL | Freq: Three times a day (TID) | ORAL | 12 refills | Status: AC

## 2020-03-31 MED ORDER — MELATONIN 5 MG PO TABS
ORAL_TABLET | ORAL | Status: AC
  Administered 2020-03-31: 5 mg via ORAL
  Filled 2020-03-31: qty 1

## 2020-03-31 MED ORDER — SENNOSIDES-DOCUSATE SODIUM 8.6-50 MG PO TABS: 2.0000 | ORAL_TABLET | Freq: Every day | ORAL | Status: AC

## 2020-03-31 MED ORDER — DOCUSATE SODIUM 100 MG PO CAPS: 100.0000 mg | ORAL_CAPSULE | Freq: Two times a day (BID) | ORAL | 0 refills | Status: AC

## 2020-03-31 MED ORDER — TORSEMIDE 10 MG PO TABS: 10.0000 mg | ORAL_TABLET | Freq: Every day | ORAL | Status: AC

## 2020-03-31 MED ORDER — POLYETHYLENE GLYCOL 3350 17 G PO PACK: 17.0000 g | PACK | Freq: Two times a day (BID) | ORAL | 0 refills | Status: AC

## 2020-03-31 MED ORDER — MELATONIN 5 MG PO TABS
5.0000 mg | ORAL_TABLET | Freq: Every day | ORAL | Status: DC
  Administered 2020-03-31: 5 mg via ORAL

## 2020-03-31 NOTE — Progress Notes (Signed)
Progress Note  Patient Name: Rebecca Cruz Date of Encounter: 03/31/2020  [facility] Cardiologist:New Dr. Gardner Candle rounding  Subjective   Currently getting screened.  Breathing is okay.  Denies chest pain or palpitations.  Denies edema.  Inpatient Medications    Scheduled Meds: . apixaban  2.5 mg Oral BID  . calcium acetate  667 mg Oral BID WC  . Chlorhexidine Gluconate Cloth  6 each Topical Daily  . docusate sodium  100 mg Oral BID  . feeding supplement (ENSURE ENLIVE)  237 mL Oral TID BM  . levothyroxine  75 mcg Oral Q0600  . melatonin  5 mg Oral QHS  . metoprolol succinate  25 mg Oral Daily  . multivitamin with minerals  1 tablet Oral Daily  . polyethylene glycol  17 g Oral BID  . senna-docusate  2 tablet Oral Daily  . torsemide  10 mg Oral Daily   Continuous Infusions: . albumin human 12.5 g (03/31/20 1056)  . methocarbamol (ROBAXIN) IV     PRN Meds: acetaminophen, bisacodyl, HYDROmorphone (DILAUDID) injection, methocarbamol **OR** methocarbamol (ROBAXIN) IV, ondansetron **OR** ondansetron (ZOFRAN) IV, sodium phosphate, traMADol   Vital Signs    Vitals:   03/30/20 0740 03/30/20 2344 03/31/20 0813 03/31/20 1059  BP: 119/69 124/67 (!) 110/93   Pulse: 73 77 68   Resp: 16 18 19    Temp: 97.8 F (36.6 C) 98.1 F (36.7 C) 97.9 F (36.6 C)   TempSrc: Oral Oral Oral   SpO2:  99% (!) 2% 94%  Weight:      Height:        Intake/Output Summary (Last 24 hours) at 03/31/2020 1148 Last data filed at 03/31/2020 1057 Gross per 24 hour  Intake --  Output 1475 ml  Net -1475 ml   Last 3 Weights 03/21/2020  Weight (lbs) 113 lb 9.6 oz  Weight (kg) 51.529 kg      Telemetry    Atrial fibrillation, heart rate 75- Personally Reviewed  ECG    No new tracing- Personally Reviewed  Physical Exam   GEN: No acute distress.   Neck: No JVD Cardiac:  Irregular irregular, systolic murmur Respiratory:  Decreased breath sounds at bases GI: Soft, nontender,  non-distended  MS: No edema;  Neuro:  Nonfocal  Psych: Normal affect   Labs    High Sensitivity Troponin:   Recent Labs  Lab 03/29/20 1535 03/29/20 1750 03/29/20 2205 03/30/20 0005  TROPONINIHS 458* 517* 506* 479*      Chemistry Recent Labs  Lab 03/29/20 0454 03/30/20 0005 03/31/20 0639  NA 138 137 139  K 5.0 4.2 4.1  CL 108 104 104  CO2 24 21* 25  GLUCOSE 101* 129* 100*  BUN 75* 67* 69*  CREATININE 1.50* 1.42* 1.24*  CALCIUM 7.9* 8.1* 8.3*  ALBUMIN 2.5*  --   --   GFRNONAA 30* 32* 37*  GFRAA 34* 37* 43*  ANIONGAP 6 12 10      Hematology Recent Labs  Lab 03/29/20 0454 03/30/20 0005 03/31/20 0639  WBC 29.2* 28.2* 18.2*  RBC 2.90* 2.95* 2.80*  HGB 8.8* 9.0* 8.6*  HCT 28.4* 29.5* 28.3*  MCV 97.9 100.0 101.1*  MCH 30.3 30.5 30.7  MCHC 31.0 30.5 30.4  RDW 18.3* 19.0* 19.3*  PLT 376 384 312    BNP Recent Labs  Lab 03/29/20 0454  BNP 1,611.0*     DDimer No results for input(s): DDIMER in the last 168 hours.   Radiology    ECHOCARDIOGRAM COMPLETE  Result Date:  03/30/2020    ECHOCARDIOGRAM REPORT   Patient Name:   Rebecca Cruz Date of Exam: 03/30/2020 Medical Rec #:  [id number]          Height:       60.0 in Accession #:    [id number]         Weight:       113.6 lb Date of Birth:  [birth year]           BSA:          1.468 m Patient Age:    86 years           BP:           96/52 mmHg Patient Gender: F                  HR:           79 bpm. Exam Location:  [facility] Procedure: 2D Echo, Cardiac Doppler and Color Doppler Indications:     CHF-acute diastolic 706.23  History:         Patient has no prior history of Echocardiogram examinations.                  Risk Factors:Hypertension.  Sonographer:     Sherrie Sport RDCS (AE) Referring Phys:  [place] Diagnosing Phys: Bartholome Bill MD IMPRESSIONS  1. Left ventricular ejection fraction, by estimation, is 70 to 75%. The left ventricle has normal function. The left ventricle has no regional wall motion  abnormalities. There is mild left ventricular hypertrophy. Left ventricular diastolic parameters were normal.  2. Right ventricular systolic function is normal. The right ventricular size is mildly enlarged. There is severely elevated pulmonary artery systolic pressure.  3. Right atrial size was mildly dilated.  4. The mitral valve is grossly normal. Mild mitral valve regurgitation.  5. Tricuspid valve regurgitation is moderate.  6. The aortic valve was not well visualized. Aortic valve regurgitation is trivial. Severe aortic valve stenosis. FINDINGS  Left Ventricle: Left ventricular ejection fraction, by estimation, is 70 to 75%. The left ventricle has normal function. The left ventricle has no regional wall motion abnormalities. The left ventricular internal cavity size was normal in size. There is  mild left ventricular hypertrophy. Left ventricular diastolic parameters were normal. Right Ventricle: The right ventricular size is mildly enlarged. No increase in right ventricular wall thickness. Right ventricular systolic function is normal. There is severely elevated pulmonary artery systolic pressure. The tricuspid regurgitant velocity is 4.13 m/s, and with an assumed right atrial pressure of 10 mmHg, the estimated right ventricular systolic pressure is 76.2 mmHg. Left Atrium: Left atrial size was normal in size. Right Atrium: Right atrial size was mildly dilated. Pericardium: There is no evidence of pericardial effusion. Mitral Valve: The mitral valve is grossly normal. Mild mitral valve regurgitation. Tricuspid Valve: The tricuspid valve is grossly normal. Tricuspid valve regurgitation is moderate. Aortic Valve: The aortic valve was not well visualized. Aortic valve regurgitation is trivial. Severe aortic stenosis is present. Aortic valve mean gradient measures 52.0 mmHg. Aortic valve peak gradient measures 81.5 mmHg. Aortic valve area, by VTI measures 0.46 cm. Pulmonic Valve: The pulmonic valve was not well  visualized. Pulmonic valve regurgitation is not visualized. Aorta: The aortic root was not well visualized. IAS/Shunts: The interatrial septum was not assessed.  LEFT VENTRICLE PLAX 2D LVIDd:         3.55 cm LVIDs:         1.90 cm LV  PW:         1.40 cm LV IVS:        0.91 cm LVOT diam:     1.90 cm LV SV:         46 LV SV Index:   31 LVOT Area:     2.84 cm  RIGHT VENTRICLE RV Basal diam:  4.23 cm RV S prime:     8.81 cm/s TAPSE (M-mode): 2.5 cm LEFT ATRIUM           Index       RIGHT ATRIUM           Index LA diam:      3.60 cm 2.45 cm/m  RA Area:     18.70 cm LA Vol (A2C): 42.5 ml 28.96 ml/m RA Volume:   62.10 ml  42.31 ml/m LA Vol (A4C): 49.1 ml 33.45 ml/m  AORTIC VALVE                    PULMONIC VALVE AV Area (Vmax):    0.47 cm     PV Vmax:        0.85 m/s AV Area (Vmean):   0.47 cm     PV Peak grad:   2.9 mmHg AV Area (VTI):     0.46 cm     RVOT Peak grad: 3 mmHg AV Vmax:           451.33 cm/s AV Vmean:          341.000 cm/s AV VTI:            1.001 m AV Peak Grad:      81.5 mmHg AV Mean Grad:      52.0 mmHg LVOT Vmax:         75.10 cm/s LVOT Vmean:        56.800 cm/s LVOT VTI:          0.161 m LVOT/AV VTI ratio: 0.16  AORTA Ao Root diam: 2.20 cm MITRAL VALVE                TRICUSPID VALVE MV Area (PHT): 2.92 cm     TR Peak grad:   68.2 mmHg MV Decel Time: 260 msec     TR Vmax:        413.00 cm/s MV E velocity: 136.00 cm/s                             SHUNTS                             Systemic VTI:  0.16 m                             Systemic Diam: 1.90 cm Bartholome Bill MD Electronically signed by Bartholome Bill MD Signature Date/Time: 03/30/2020/8:32:04 AM    Final     Cardiac Studies   Echo 03/30/20 1. Left ventricular ejection fraction, by estimation, is 70 to 75%. The  left ventricle has normal function. The left ventricle has no regional  wall motion abnormalities. There is mild left ventricular hypertrophy.  Left ventricular diastolic parameters  were normal.  2. Right ventricular  systolic function is normal. The right ventricular  size is mildly enlarged. There is severely elevated pulmonary artery  systolic pressure.  3. Right atrial size was mildly  dilated.  4. The mitral valve is grossly normal. Mild mitral valve regurgitation.  5. Tricuspid valve regurgitation is moderate.  6. The aortic valve was not well visualized. Aortic valve regurgitation  is trivial. Severe aortic valve stenosis.   Patient Profile     86 y.o. female with history of permanent A. fib, hypertension, Severe AS status post AVR in the past, heart failure preserved EF being seen due to A. Fib and Severe AS of bioprosthetic valve.  Hip fracture status post repair 6/28  Assessment & Plan    1. Severe AS, pulmonary hypertension -Likely contributing to heart failure symptoms -agree with torsemide 10 mg daily -Creatinine improved, Albumin being repleted per renal. -Appreciate input from nephrology. -Patient not likely surgical candidate.  valve in valve TAVR is an option but comorbidities may make patient not an ideal candidate.  Discussions regarding these can occur in the outpatient setting.  2.  Permanent atrial fibrillation -Heart rate controlled -Continue Toprol and Eliquis.  3.  Hip fracture -Management as per Ortho and medicine team  Total encounter time 35 minutes  Greater than 50% was spent in counseling and coordination of care with the patient       Signed, Kate Sable, MD  03/31/2020, 11:48 AM

## 2020-03-31 NOTE — Progress Notes (Signed)
[address] [place], [place] [address] Phone [phone]. Fax [phone]  Date: 03/31/2020                  Patient Name:  Rebecca Cruz  MRN: [id number]  DOB: 1924/09/09  Age / Sex: 86 y.o., female         PCP: Patient, No Pcp Per                 Service Requesting Consult: IM/ Bonnielee Haff, MD                 Reason for Consult: ARF            History of Present Illness: Patient is a 86 y.o. female with medical problems of hypertension, who was admitted to [facility] on 03/21/2020 for evaluation of right hip pain after fall.  She was diagnosed with a right hip fracture and underwent intramedullary nailing of right femur on June 28. Nephrology consult has been requested for rising creatinine as well as fluid overload  Patient was started on IV albumin  Peripheral edema appears to be slightly better Creatinine improved to 1.24 today Patient is able to eat without nausea or vomiting She ate more than half of her breakfast today.    Current medications: Current Facility-Administered Medications  Medication Dose Route Frequency Provider Last Rate Last Admin  . acetaminophen (TYLENOL) tablet 650 mg  650 mg Oral Q6H PRN Modena Jansky, MD   650 mg at 03/31/20 0449  . albumin human 25 % solution 12.5 g  12.5 g Intravenous BID Murlean Iba, MD 60 mL/hr at 03/31/20 1056 12.5 g at 03/31/20 1056  . apixaban (ELIQUIS) tablet 2.5 mg  2.5 mg Oral BID Vernell Leep D, MD   2.5 mg at 03/31/20 1025  . bisacodyl (DULCOLAX) suppository 10 mg  10 mg Rectal Daily PRN Leim Fabry, MD   10 mg at 03/25/20 1000  . calcium acetate (PHOSLO) capsule 667 mg  667 mg Oral BID WC Leim Fabry, MD   667 mg at 03/31/20 1026  . Chlorhexidine Gluconate Cloth 2 % PADS 6 each  6 each Topical Daily Enzo Bi, MD   6 each at 03/26/20 1119  . docusate sodium (COLACE) capsule 100 mg  100 mg Oral BID Leim Fabry, MD   100 mg at 03/31/20 1025  . feeding supplement (ENSURE ENLIVE) (ENSURE  ENLIVE) liquid 237 mL  237 mL Oral TID BM Bonnielee Haff, MD   237 mL at 03/30/20 2132  . HYDROmorphone (DILAUDID) injection 0.5 mg  0.5 mg Intravenous Q8H PRN Modena Jansky, MD   0.5 mg at 03/28/20 0148  . levothyroxine (SYNTHROID) tablet 75 mcg  75 mcg Oral Q0600 Leim Fabry, MD   75 mcg at 03/31/20 0449  . melatonin tablet 5 mg  5 mg Oral QHS Sharion Settler, NP   5 mg at 03/31/20 0344  . methocarbamol (ROBAXIN) tablet 500 mg  500 mg Oral Q6H PRN Leim Fabry, MD   500 mg at 03/30/20 2303   Or  . methocarbamol (ROBAXIN) 500 mg in dextrose 5 % 50 mL IVPB  500 mg Intravenous Q6H PRN Leim Fabry, MD      . metoprolol succinate (TOPROL-XL) 24 hr tablet 25 mg  25 mg Oral Daily Modena Jansky, MD   25 mg at 03/31/20 1025  . multivitamin with minerals tablet 1 tablet  1 tablet Oral Daily Bonnielee Haff, MD   1 tablet at 03/31/20 1025  .  ondansetron (ZOFRAN) tablet 4 mg  4 mg Oral Q6H PRN Leim Fabry, MD       Or  . ondansetron [facility]) injection 4 mg  4 mg Intravenous Q6H PRN Leim Fabry, MD   4 mg at 03/27/20 2111  . polyethylene glycol (MIRALAX / GLYCOLAX) packet 17 g  17 g Oral BID Modena Jansky, MD   17 g at 03/30/20 2132  . senna-docusate (Senokot-S) tablet 2 tablet  2 tablet Oral Daily Modena Jansky, MD   2 tablet at 03/31/20 1025  . sodium phosphate (FLEET) 7-19 GM/118ML enema 1 enema  1 enema Rectal Once PRN Leim Fabry, MD      . torsemide [facility]) tablet 10 mg  10 mg Oral Daily Murlean Iba, MD   10 mg at 03/31/20 1026  . traMADol (ULTRAM) tablet 50 mg  50 mg Oral Q12H PRN Modena Jansky, MD   50 mg at 03/30/20 2304     Vital Signs: Blood pressure (!) 116/58, pulse 75, temperature (!) 97.3 F (36.3 C), temperature source Oral, resp. rate (!) 22, height 5' (1.524 m), weight 51.5 kg, SpO2 93 %.   Intake/Output Summary (Last 24 hours) at 03/31/2020 1239 Last data filed at 03/31/2020 1057 Gross per 24 hour  Intake --  Output 1475 ml  Net -1475 ml    Weight  trends: [employer]   03/21/20 1610  Weight: 51.5 kg    Physical Exam: General:  Frail, elderly woman, laying in the bed  HEENT  dry oral mucous membranes  Neck:  No JVD  Lungs:  Normal breathing effort on room air, mild fine crackles  Heart::  No rub  Abdomen:  Soft, nontender, nondistended  Extremities:  Some dependent edema on the thighs and arms,  Neurologic:  Alert, able to answer few simple questions  Skin:  Scattered ecchymosis, redness over fluid collection near the left elbow    Lab results: Basic Metabolic Panel: Recent Labs  Lab 03/25/20 0456 03/26/20 0632 03/29/20 0454 03/30/20 0005 03/31/20 0639  NA 139   < > 138 137 139  K 4.2   < > 5.0 4.2 4.1  CL 109   < > 108 104 104  CO2 22   < > 24 21* 25  GLUCOSE 106*   < > 101* 129* 100*  BUN 55*   < > 75* 67* 69*  CREATININE 1.14*   < > 1.50* 1.42* 1.24*  CALCIUM 7.8*   < > 7.9* 8.1* 8.3*  MG 2.0  --   --   --  2.1   < > = values in this interval not displayed.    Liver Function Tests: Recent Labs  Lab 03/29/20 0454  ALBUMIN 2.5*   No results for input(s): LIPASE, AMYLASE in the last 168 hours. No results for input(s): AMMONIA in the last 168 hours.  CBC: Recent Labs  Lab 03/25/20 0456 03/25/20 1501 03/30/20 0005 03/31/20 0639  WBC 27.8*   < > 28.2* 18.2*  NEUTROABS 20.0*  --   --   --   HGB 8.6*   < > 9.0* 8.6*  HCT 27.6*   < > 29.5* 28.3*  MCV 100.7*   < > 100.0 101.1*  PLT 375   < > 384 312   < > = values in this interval not displayed.    Cardiac Enzymes: Recent Labs  Lab 03/27/20 0904  CKTOTAL 487*    BNP: Invalid input(s): POCBNP  CBG: No results for input(s):  GLUCAP in the last 168 hours.  Microbiology: Recent Results (from the past 720 hour(s))  SARS Coronavirus 2 by RT PCR (hospital order, performed in [facility] hospital lab) Nasopharyngeal Nasopharyngeal Swab     Status: None   Collection Time: 03/21/20  8:06 AM   Specimen: Nasopharyngeal Swab  Result Value Ref Range  Status   SARS Coronavirus 2 NEGATIVE NEGATIVE Final    Comment: (NOTE) SARS-CoV-2 target nucleic acids are NOT DETECTED.  The SARS-CoV-2 RNA is generally detectable in upper and lower respiratory specimens during the acute phase of infection. The lowest concentration of SARS-CoV-2 viral copies this assay can detect is 250 copies / mL. A negative result does not preclude SARS-CoV-2 infection and should not be used as the sole basis for treatment or other patient management decisions.  A negative result may occur with improper specimen collection / handling, submission of specimen other than nasopharyngeal swab, presence of viral mutation(s) within the areas targeted by this assay, and inadequate number of viral copies (<250 copies / mL). A negative result must be combined with clinical observations, patient history, and epidemiological information.  Fact Sheet for Patients:   StrictlyIdeas.no  Fact Sheet for Healthcare Providers: BankingDealers.co.za  This test is not yet approved or  cleared by the [place] FDA and has been authorized for detection and/or diagnosis of SARS-CoV-2 by FDA under an Emergency Use Authorization (EUA).  This EUA will remain in effect (meaning this test can be used) for the duration of the COVID-19 declaration under Section 564(b)(1) of the Act, 21 U.S.C. section 360bbb-3(b)(1), unless the authorization is terminated or revoked sooner.  Performed at [facility], [address], [place], [place] [address]   Surgical PCR screen     Status: None   Collection Time: 03/21/20 12:15 PM   Specimen: Nasal Mucosa; Nasal Swab  Result Value Ref Range Status   MRSA, PCR NEGATIVE NEGATIVE Final   Staphylococcus aureus NEGATIVE NEGATIVE Final    Comment: (NOTE) The Xpert SA Assay (FDA approved for NASAL specimens in patients 58 years of age and older), is one component of a comprehensive surveillance  program. It is not intended to diagnose infection nor to guide or monitor treatment. Performed at [facility], [address] [place], [place] [address]      Coagulation Studies: No results for input(s): LABPROT, INR in the last 72 hours.  Urinalysis: No results for input(s): COLORURINE, LABSPEC, PHURINE, GLUCOSEU, HGBUR, BILIRUBINUR, KETONESUR, PROTEINUR, UROBILINOGEN, NITRITE, LEUKOCYTESUR in the last 72 hours.  Invalid input(s): APPERANCEUR      Imaging: ECHOCARDIOGRAM COMPLETE  Result Date: 03/30/2020    ECHOCARDIOGRAM REPORT   Patient Name:   TAJIA SZELIGA Date of Exam: 03/30/2020 Medical Rec #:  [id number]          Height:       60.0 in Accession #:    [id number]         Weight:       113.6 lb Date of Birth:  Dec 11, 1923           BSA:          1.468 m Patient Age:    14 years           BP:           96/52 mmHg Patient Gender: F                  HR:           79  bpm. Exam Location:  [facility] Procedure: 2D Echo, Cardiac Doppler and Color Doppler Indications:     CHF-acute diastolic 209.47  History:         Patient has no prior history of Echocardiogram examinations.                  Risk Factors:Hypertension.  Sonographer:     Sherrie Sport RDCS (AE) Referring Phys:  [place] Diagnosing Phys: Bartholome Bill MD IMPRESSIONS  1. Left ventricular ejection fraction, by estimation, is 70 to 75%. The left ventricle has normal function. The left ventricle has no regional wall motion abnormalities. There is mild left ventricular hypertrophy. Left ventricular diastolic parameters were normal.  2. Right ventricular systolic function is normal. The right ventricular size is mildly enlarged. There is severely elevated pulmonary artery systolic pressure.  3. Right atrial size was mildly dilated.  4. The mitral valve is grossly normal. Mild mitral valve regurgitation.  5. Tricuspid valve regurgitation is moderate.  6. The aortic valve was not well visualized. Aortic valve regurgitation is  trivial. Severe aortic valve stenosis. FINDINGS  Left Ventricle: Left ventricular ejection fraction, by estimation, is 70 to 75%. The left ventricle has normal function. The left ventricle has no regional wall motion abnormalities. The left ventricular internal cavity size was normal in size. There is  mild left ventricular hypertrophy. Left ventricular diastolic parameters were normal. Right Ventricle: The right ventricular size is mildly enlarged. No increase in right ventricular wall thickness. Right ventricular systolic function is normal. There is severely elevated pulmonary artery systolic pressure. The tricuspid regurgitant velocity is 4.13 m/s, and with an assumed right atrial pressure of 10 mmHg, the estimated right ventricular systolic pressure is 09.6 mmHg. Left Atrium: Left atrial size was normal in size. Right Atrium: Right atrial size was mildly dilated. Pericardium: There is no evidence of pericardial effusion. Mitral Valve: The mitral valve is grossly normal. Mild mitral valve regurgitation. Tricuspid Valve: The tricuspid valve is grossly normal. Tricuspid valve regurgitation is moderate. Aortic Valve: The aortic valve was not well visualized. Aortic valve regurgitation is trivial. Severe aortic stenosis is present. Aortic valve mean gradient measures 52.0 mmHg. Aortic valve peak gradient measures 81.5 mmHg. Aortic valve area, by VTI measures 0.46 cm. Pulmonic Valve: The pulmonic valve was not well visualized. Pulmonic valve regurgitation is not visualized. Aorta: The aortic root was not well visualized. IAS/Shunts: The interatrial septum was not assessed.  LEFT VENTRICLE PLAX 2D LVIDd:         3.55 cm LVIDs:         1.90 cm LV PW:         1.40 cm LV IVS:        0.91 cm LVOT diam:     1.90 cm LV SV:         46 LV SV Index:   31 LVOT Area:     2.84 cm  RIGHT VENTRICLE RV Basal diam:  4.23 cm RV S prime:     8.81 cm/s TAPSE (M-mode): 2.5 cm LEFT ATRIUM           Index       RIGHT ATRIUM            Index LA diam:      3.60 cm 2.45 cm/m  RA Area:     18.70 cm LA Vol (A2C): 42.5 ml 28.96 ml/m RA Volume:   62.10 ml  42.31 ml/m LA Vol (A4C): 49.1 ml 33.45 ml/m  AORTIC VALVE                    PULMONIC VALVE AV Area (Vmax):    0.47 cm     PV Vmax:        0.85 m/s AV Area (Vmean):   0.47 cm     PV Peak grad:   2.9 mmHg AV Area (VTI):     0.46 cm     RVOT Peak grad: 3 mmHg AV Vmax:           451.33 cm/s AV Vmean:          341.000 cm/s AV VTI:            1.001 m AV Peak Grad:      81.5 mmHg AV Mean Grad:      52.0 mmHg LVOT Vmax:         75.10 cm/s LVOT Vmean:        56.800 cm/s LVOT VTI:          0.161 m LVOT/AV VTI ratio: 0.16  AORTA Ao Root diam: 2.20 cm MITRAL VALVE                TRICUSPID VALVE MV Area (PHT): 2.92 cm     TR Peak grad:   68.2 mmHg MV Decel Time: 260 msec     TR Vmax:        413.00 cm/s MV E velocity: 136.00 cm/s                             SHUNTS                             Systemic VTI:  0.16 m                             Systemic Diam: 1.90 cm Bartholome Bill MD Electronically signed by Bartholome Bill MD Signature Date/Time: 03/30/2020/8:32:04 AM    Final      Assessment & Plan: Pt is a 86 y.o.   female with HTN, hypothyroidosm, A Fib, CKD, was admitted on 03/21/2020 with Fall [W19.XXXA] Closed fracture of right hip, initial encounter [facility]) [S72.001A] Intertrochanteric fracture of right femur, closed, initial encounter ([facility]) [S72.141A]  #Acute kidney injury Baseline creatinine of 0.93 from March 22, 2020 Creatinine has been fluctuating and has been increasing for the past few days from 1.1 to peak of 1.5   Urinalysis from July 1 shows moderate blood with 11-20 RBCs, 11-20 WBCs with mild protein. Renal ultrasound from July 4 shows kidneys at lower limits of normal size, left smaller than right.  No hydronephrosis.  Slightly increased right renal echogenicity.  Small bilateral pleural effusions Acute kidney injury is likely secondary to volume shifts -Avoid nephrotoxins (eg  aminoglycosides, IV contrast, nonsteroidals) -Avoid hypotension -Serum creatinine trend has started to improve with IV albumin supplementation  #Volume overload Likely third spacing of fluid due to low albumin (of 2.5) likely Secondary to poor nutrition.  Boost or ensure supplements Continue to supplement IV albumin at low-dose of 12.5 g twice a day Monitor volume and pulmonary status closely continue low-dose torsemide We will follow      LOS: 10 Konnar Ben 7/7/202112:39 PM    Note: This note was prepared with Dragon dictation. Any transcription errors are unintentional

## 2020-03-31 NOTE — TOC Progression Note (Signed)
Transition of Care [facility]) - Progression Note    Patient Details  Name: Rebecca Cruz MRN: [id number] Date of Birth: Aug 25, 1924  Transition of Care [facility]) CM/SW Contact  Su Hilt, RN Phone Number: 03/31/2020, 3:58 PM  Clinical Narrative:    The patient's son called [employer] to schedule ground transport to transport the patient to go to [place] for SNF, The company was unable to arrange until 7/17, the family decided to arrange air medical transport so that the patient can get back to [facility] to be able to see the family as she is declining, the son Eddie Dibbles understands that the patient may decline during the flight and he agrees to take the risk, The air transport is planning to pick up tomorrow, I faxed Updated clinical information to [facility] rehab The patient to get a new covid test this evening to prepare for DC       Expected Discharge Plan and Services                                                 Social Determinants of Health (SDOH) Interventions    Readmission Risk Interventions No flowsheet data found.

## 2020-03-31 NOTE — Discharge Summary (Signed)
[facility]  Physician Discharge Summary   Patient ID: Rebecca Cruz MRN: [id number] DOB/AGE: 84-Sep-1925 84 y.o.  Admit date: 03/21/2020 Discharge date: 03/31/2020  PCP: Patient, No Pcp Per  DISCHARGE DIAGNOSES:  Right intertrochanteric fracture  Status post IM nail 7/82 Acute diastolic CHF Severe aortic stenosis Permanent atrial fibrillation Elevated troponin Acute kidney injury on chronic kidney disease stage IV Essential hypertension Hypothyroidism Postoperative acute blood loss anemia Leukocytosis Urinary retention requiring Foley catheter Acute metabolic encephalopathy Severe protein calorie malnutrition with failure to thrive  RECOMMENDATIONS FOR OUTPATIENT FOLLOW UP: 1. CBC and metabolic panel on Monday 2. Leave Foley catheter and do voiding trial when patient is able to ambulate 3. Consider outpatient referral to hematology for leukocytosis. 4. Consider follow-up with cardiology for her multiple cardiac issues.    Home Health: None Equipment/Devices: None  CODE STATUS: DNR  DISCHARGE CONDITION: Guarded  Diet recommendation: Dysphagia 2 diet with nectar thick liquids  INITIAL HISTORY: 86 year old female with PMH of hypertension, hypothyroidism, stage IV chronic kidney disease, atrial fibrillation on chronic anticoagulation, originally from [place], visiting family in the [place] area, presented to ED via EMS for evaluation of right hip pain.  S/p IM intertrochanteric nail on 6/28 for intertrochanteric fracture of right femur.  Hospital course complicated by ecchymosis of right medial thigh possibly related to surgical manipulation, some acute blood loss anemia, acute kidney injury, acute urinary retention, AMS and overall slow progress.  Hematology consulted for leukocytosis.  RLE ecchymosis, ABLA stabilized.  Creatinine fluctuating?  Element of cardiorenal syndrome.  Mild CHF/coughing-IV Lasix.  Urinary retention recurrent, Foley catheter placed  back.  AMS better but not resolved.  Nephrology consulted due to acute kidney injury and cardiology for acute CHF.  Consultations: Orthopedic Hematology Nephrology Cardiology Palliative  Procedures: S/p right hip fracture IM nail 6/28   HOSPITAL COURSE:   Intertrochanteric fracture of right femur, s/p IM nail 6/28.   Sustained a fall and had a displaced comminuted fracture.  Status post IM nail on 6/28.   Orthopedics has been following.  PT and OT evaluation.  Skilled nursing facility is recommended.   Hospital course complicated by extensive right medial thigh bruising extending from mons pubis/groin mostly up to the back of the right knee in a bandlike fashion, some of it tracking faintly to the right ankle.   Per orthopedics, this was possibly related to some manipulation of the medial aspect of the extremity during surgery which may have precipitated some bleeding.  Eliquis temporarily held for about 48 hours, resumed 7/3. Pain management-judicious use of opioids.   She has been stable from an orthopedic standpoint.  Acute diastolic CHF/Severe Aortic Stenosis Noted to be in fluid overload on 7/3.  She was given Lasix.   TTE in care everywhere on 10/04/2018: LVEF 70+%, mild LVH.  Possible LV relaxation abnormality.  Moderate to severe TR.  Pulmonary hypertension.  PFO/ASD with left-to-right shunting.   Echocardiogram was repeated during this hospital stay and shows preserved EF.  No evidence for diastolic dysfunction noted.  Significant aortic valve stenosis however was noted.  Cardiology has been following.  Lasix was changed over to torsemide.  She is also getting albumin per nephrology.  No further inpatient work-up recommended.  Noted to be on oxygen this morning.  More lethargy was noted.  Likely due to melatonin that she was given earlier today.  Chest x-ray was repeated which continues to show small bilateral pleural effusion with vascular congestion.  Possible nonsustained VT  versus brief run of  A. fib On the morning of 7/7 she had an asymptomatic brief run of nonsustained ventricular tachycardia.  Potassium is noted to be normal at 4.1.  Magnesium was 2.1.    Elevated troponin Troponin is noted to be minimally elevated.  Cardiology was following.  Echocardiogram does not show any wall motion abnormalities.  Mild LVH was noted.  Does not appear as if cardiology plans to do any further work-up.    AKI on chronic kidney disease stage IV  Creatinine 0.9 on admission.  Since admission creatinine has been fluctuating up and down.  May be multifactorial due to fluctuating and poor oral intake, mild rhabdo and cardiorenal.  Briefly hydrated with IV fluids then stopped due to volume overload. Renal ultrasound without hydronephrosis. Subsequently nephrology was consulted.  Patient received IV albumin along with diuretics with good response.  Changed over to torsemide.  Renal function appears to be improving.     Permanent atrial fibrillation Continue reduced dose of Toprol-XL and Eliquis resumed after temporary hold for 48 hours.  No overt bleeding.  Essential hypertension Losartan held due to acute kidney injury.  Blood pressures have improved after cutting back Toprol-XL from 50 to 25 mg daily.  Hypothyroid Continue Synthroid.  Postop acute blood loss anemia Drop in hemoglobin likely due to surgical blood loss and bruising noted in the right lower extremity.  For the past several days hemoglobin has been stable.  Transfuse if it drops below 7.    Leukocytosis WBC 18 on presentation which progressively increased, 22.3 > 26.7 > 32.6.  Possible stress response.  No obvious source of infection is identified.  According to family patient has a history of elevated WBC.  WBC noted to be better this morning. Hematology was also consulted and they do suspect that it is likely reactive.  Peripheral smear without concerning features.  Pathologist review of the smear low  concern for leukemia.   This may need further management in the outpatient setting.   Acute urinary retention, recurrent After 2 attempts of in and out catheterization last night, eventually had Foley catheter placed 7/1.  Foley catheter discontinued on 7/3.  But developed recurrent urinary retention and Foley catheter placed back 7/5. Persistent urinary retention most likely due to the fact that she has been nonambulatory for such a long period of time.   Would recommend that the Foley catheter be kept in until patient is able to get up and ambulate with physical therapy.  Acute metabolic encephalopathy:  Likely multifactorial related to pain, pain medicines, hospitalization in a very elderly female patient who may have some underlying cognitive impairment.  Supportive care, minimize opioids, redirect.   Body mass index is 22.19 kg/m.  Severe protein calorie malnutrition Nutrition Problem: Severe Malnutrition Etiology: chronic illness (CKD) Signs/Symptoms: severe fat depletion, severe muscle depletion Interventions: Refer to RD note for recommendations   Adult failure to thrive: Multifactorial due to advanced age and multiple severe acute and chronic comorbidities as above.  Discussed in detail with son and updated current status and how ill she is.  Palliative care medicine consulted for goals of care.    Goals of care Palliative care has discussed with family.  The concern is that patient may decline further.  Patient has been waiting to achieve medical stability before going back to [place] where she will be going into a skilled nursing facility.  She lives in [place] and was here on a visit.  She has multiple family members in that area. The family's  priority right now is to get her back to [place].  They would like to do that tomorrow by air ambulance.  They understand that patient may decompensate in route.  However we also understand the need for the patient to be closer to her  family.  Risks and benefits explained.  They would still like to proceed with this plan.  We will facilitate to the best of our ability.  Plan is for discharge from hospital tomorrow, 7/8.    PERTINENT LABS:  The results of significant diagnostics from this hospitalization (including imaging, microbiology, ancillary and laboratory) are listed below for reference.      Labs:    Basic Metabolic Panel: Recent Labs  Lab 03/25/20 0456 03/26/20 4403 03/28/20 0459 03/28/20 0749 03/29/20 0454 03/30/20 0005 03/31/20 0639  NA 139   < > 139 139 138 137 139  K 4.2   < > 5.7* 5.0 5.0 4.2 4.1  CL 109   < > 110 109 108 104 104  CO2 22   < > 24 22 24  21* 25  GLUCOSE 106*   < > 98 103* 101* 129* 100*  BUN 55*   < > 70* 70* 75* 67* 69*  CREATININE 1.14*   < > 1.37* 1.32* 1.50* 1.42* 1.24*  CALCIUM 7.8*   < > 8.1* 8.2* 7.9* 8.1* 8.3*  MG 2.0  --   --   --   --   --  2.1   < > = values in this interval not displayed.   Liver Function Tests: Recent Labs  Lab 03/29/20 0454  ALBUMIN 2.5*   CBC: Recent Labs  Lab 03/25/20 0456 03/25/20 1501 03/27/20 0344 03/28/20 0459 03/29/20 0454 03/30/20 0005 03/31/20 0639  WBC 27.8*   < > 30.9* 27.6* 29.2* 28.2* 18.2*  NEUTROABS 20.0*  --   --   --   --   --   --   HGB 8.6*   < > 8.5* 8.4* 8.8* 9.0* 8.6*  HCT 27.6*   < > 26.5* 28.2* 28.4* 29.5* 28.3*  MCV 100.7*   < > 97.4 102.5* 97.9 100.0 101.1*  PLT 375   < > 361 360 376 384 312   < > = values in this interval not displayed.   Cardiac Enzymes: Recent Labs  Lab 03/27/20 0904  CKTOTAL 487*   BNP: BNP (last 3 results) Recent Labs    03/29/20 0454  BNP 1,611.0*     IMAGING STUDIES DG Chest 1 View  Result Date: 03/21/2020 CLINICAL DATA:  Preoperative study EXAM: CHEST  1 VIEW COMPARISON:  None. FINDINGS: Cardiomegaly. Diffuse interstitial opacities and mild Kerley B lines. No pneumothorax. No nodules or masses. No focal infiltrates. IMPRESSION: Cardiomegaly and mild edema.  Electronically Signed   By: Dorise Bullion III M.D   On: 03/21/2020 09:09   DG Pelvis 1-2 Views  Result Date: 03/21/2020 CLINICAL DATA:  Pain after fall EXAM: PELVIS - 1-2 VIEW COMPARISON:  None. FINDINGS: A comminuted displaced fracture through the right femoral intertrochanteric region is identified. A nondisplaced left inferior pubic ramus fracture is not excluded. No other bony abnormalities in the pelvic bones. Vascular calcifications. IMPRESSION: 1. Displaced comminuted fracture through the right femoral intertrochanteric region. A nondisplaced fracture through the right inferior pubic ramus is not excluded. Electronically Signed   By: Dorise Bullion III M.D   On: 03/21/2020 09:12   CT Head Wo Contrast  Result Date: 03/21/2020 CLINICAL DATA:  [facility].  Pt visiting from [place]. Uses cane/walked. States she lost her balance and fell on floor inside house. Swelling noted to R upper thigh. Internal rotation to R leg. Shortening as well. A&. TKV EXAM: CT HEAD WITHOUT CONTRAST TECHNIQUE: Contiguous axial images were obtained from the base of the skull through the vertex without intravenous contrast. COMPARISON:  None. FINDINGS: Brain: No evidence of acute infarction, hemorrhage, hydrocephalus, extra-axial collection or mass lesion/mass effect. There is ventricular sulcal enlargement reflecting mild diffuse atrophy. Patchy bilateral white matter hypoattenuation is also noted consistent with chronic microvascular ischemic change. Vascular: No hyperdense vessel or unexpected calcification. Skull: Normal. Negative for fracture or focal lesion. Sinuses/Orbits: Visualized globes and orbits are unremarkable. The visualized sinuses are clear. Other: There is a small amount of soft tissue air below the skull base, projecting adjacent to the right pterygoid plates, posterior to the right maxillary sinus and in the infratemporal fossa. The etiology of this is unclear. IMPRESSION: 1. No  acute intracranial abnormalities. 2. Mild atrophy and moderate chronic microvascular ischemic change. 3. Small amount of soft tissue air at the right skull base as detailed above. Consider a facial fracture, follow-up facial CT, if there is facial trauma. Electronically Signed   By: Lajean Manes M.D.   On: 03/21/2020 09:14   US RENAL  Result Date: 03/28/2020 CLINICAL DATA:  Acute renal insufficiency. EXAM: RENAL / URINARY TRACT ULTRASOUND COMPLETE COMPARISON:  None. FINDINGS: Right Kidney: Renal measurements: 9.5 x 3.9 x 5.0 cm = volume: 97 mL. Slight increased cortical echogenicity. No mass or hydronephrosis visualized. Left Kidney: Renal measurements: 7.8 x 4.7 x 4.6 cm = volume: 89 mL. Echogenicity within normal limits. No mass or hydronephrosis visualized. Bladder: Appears normal for degree of bladder distention. Prevoid volume 413 mL. Other: Small bilateral pleural effusions. IMPRESSION: 1. Kidneys at the lower limits of normal in size left smaller than right. No evidence of hydronephrosis. Slight increased right renal cortical echogenicity which can be seen in medical renal disease. 2.  Small bilateral pleural effusions. Electronically Signed   By: Marin Olp M.D.   On: 03/28/2020 11:50   DG Chest Port 1 View  Result Date: 03/31/2020 CLINICAL DATA:  86 year old female with hypoxia. EXAM: PORTABLE CHEST 1 VIEW COMPARISON:  Chest radiograph dated 03/28/2020. FINDINGS: There is cardiomegaly with vascular congestion and mild edema. Small bilateral pleural effusions, right greater left with associated bibasilar atelectasis. Pneumonia is not excluded clinical correlation is recommended. Overall similar or slight worsening in the degree of congestion compared to the prior radiograph. There is no pneumothorax. Atherosclerotic calcification of the aorta. Median sternotomy wires. No acute osseous pathology. IMPRESSION: Cardiomegaly with findings of CHF and small bilateral pleural effusions. Pneumonia is not  excluded. Electronically Signed   By: Anner Crete M.D.   On: 03/31/2020 15:31   DG Chest Port 1 View  Result Date: 03/28/2020 CLINICAL DATA:  Cough EXAM: PORTABLE CHEST 1 VIEW COMPARISON:  March 21, 2020 FINDINGS: The mediastinal contour and cardiac silhouette are stable. Cardiac valvular replacement ring is identified unchanged. Increased pulmonary interstitium is identified bilaterally with enlarged central pulmonary vessel caliber. There are small bilateral pleural effusions. The bony structures are stable. IMPRESSION: Mild pulmonary edema. Small bilateral pleural effusions. Electronically Signed   By: Abelardo Diesel M.D.   On: 03/28/2020 09:46   DG Abd Portable 1V  Result Date: 03/25/2020 CLINICAL DATA:  Abdominal pain and distension EXAM: PORTABLE ABDOMEN - 1 VIEW COMPARISON:  None. FINDINGS: There is moderate stool throughout the colon. There  is no bowel dilatation or air-fluid level to suggest bowel obstruction. No free air. There are scattered foci of arterial vascular calcification. There is postoperative change in the proximal right femur. Bones are osteoporotic. IMPRESSION: Moderate stool in colon.  No bowel obstruction or free air evident. Electronically Signed   By: Lowella Grip III M.D.   On: 03/25/2020 10:10   ECHOCARDIOGRAM COMPLETE  Result Date: 03/30/2020    ECHOCARDIOGRAM REPORT   Patient Name:   ROSELLA CRANDELL Date of Exam: 03/30/2020 Medical Rec #:  [id number]          Height:       60.0 in Accession #:    [id number]         Weight:       113.6 lb Date of Birth:  1923-12-07           BSA:          1.468 m Patient Age:    69 years           BP:           96/52 mmHg Patient Gender: F                  HR:           79 bpm. Exam Location:  [facility] Procedure: 2D Echo, Cardiac Doppler and Color Doppler Indications:     CHF-acute diastolic 329.51  History:         Patient has no prior history of Echocardiogram examinations.                  Risk Factors:Hypertension.  Sonographer:      Sherrie Sport RDCS (AE) Referring Phys:  [place] Diagnosing Phys: Bartholome Bill MD IMPRESSIONS  1. Left ventricular ejection fraction, by estimation, is 70 to 75%. The left ventricle has normal function. The left ventricle has no regional wall motion abnormalities. There is mild left ventricular hypertrophy. Left ventricular diastolic parameters were normal.  2. Right ventricular systolic function is normal. The right ventricular size is mildly enlarged. There is severely elevated pulmonary artery systolic pressure.  3. Right atrial size was mildly dilated.  4. The mitral valve is grossly normal. Mild mitral valve regurgitation.  5. Tricuspid valve regurgitation is moderate.  6. The aortic valve was not well visualized. Aortic valve regurgitation is trivial. Severe aortic valve stenosis. FINDINGS  Left Ventricle: Left ventricular ejection fraction, by estimation, is 70 to 75%. The left ventricle has normal function. The left ventricle has no regional wall motion abnormalities. The left ventricular internal cavity size was normal in size. There is  mild left ventricular hypertrophy. Left ventricular diastolic parameters were normal. Right Ventricle: The right ventricular size is mildly enlarged. No increase in right ventricular wall thickness. Right ventricular systolic function is normal. There is severely elevated pulmonary artery systolic pressure. The tricuspid regurgitant velocity is 4.13 m/s, and with an assumed right atrial pressure of 10 mmHg, the estimated right ventricular systolic pressure is 88.4 mmHg. Left Atrium: Left atrial size was normal in size. Right Atrium: Right atrial size was mildly dilated. Pericardium: There is no evidence of pericardial effusion. Mitral Valve: The mitral valve is grossly normal. Mild mitral valve regurgitation. Tricuspid Valve: The tricuspid valve is grossly normal. Tricuspid valve regurgitation is moderate. Aortic Valve: The aortic valve was not well visualized.  Aortic valve regurgitation is trivial. Severe aortic stenosis is present. Aortic valve mean gradient measures 52.0 mmHg. Aortic valve peak gradient  measures 81.5 mmHg. Aortic valve area, by VTI measures 0.46 cm. Pulmonic Valve: The pulmonic valve was not well visualized. Pulmonic valve regurgitation is not visualized. Aorta: The aortic root was not well visualized. IAS/Shunts: The interatrial septum was not assessed.  LEFT VENTRICLE PLAX 2D LVIDd:         3.55 cm LVIDs:         1.90 cm LV PW:         1.40 cm LV IVS:        0.91 cm LVOT diam:     1.90 cm LV SV:         46 LV SV Index:   31 LVOT Area:     2.84 cm  RIGHT VENTRICLE RV Basal diam:  4.23 cm RV S prime:     8.81 cm/s TAPSE (M-mode): 2.5 cm LEFT ATRIUM           Index       RIGHT ATRIUM           Index LA diam:      3.60 cm 2.45 cm/m  RA Area:     18.70 cm LA Vol (A2C): 42.5 ml 28.96 ml/m RA Volume:   62.10 ml  42.31 ml/m LA Vol (A4C): 49.1 ml 33.45 ml/m  AORTIC VALVE                    PULMONIC VALVE AV Area (Vmax):    0.47 cm     PV Vmax:        0.85 m/s AV Area (Vmean):   0.47 cm     PV Peak grad:   2.9 mmHg AV Area (VTI):     0.46 cm     RVOT Peak grad: 3 mmHg AV Vmax:           451.33 cm/s AV Vmean:          341.000 cm/s AV VTI:            1.001 m AV Peak Grad:      81.5 mmHg AV Mean Grad:      52.0 mmHg LVOT Vmax:         75.10 cm/s LVOT Vmean:        56.800 cm/s LVOT VTI:          0.161 m LVOT/AV VTI ratio: 0.16  AORTA Ao Root diam: 2.20 cm MITRAL VALVE                TRICUSPID VALVE MV Area (PHT): 2.92 cm     TR Peak grad:   68.2 mmHg MV Decel Time: 260 msec     TR Vmax:        413.00 cm/s MV E velocity: 136.00 cm/s                             SHUNTS                             Systemic VTI:  0.16 m                             Systemic Diam: 1.90 cm Bartholome Bill MD Electronically signed by Bartholome Bill MD Signature Date/Time: 03/30/2020/8:32:04 AM    Final    DG HIP OPERATIVE UNILAT W OR W/O PELVIS RIGHT  Result Date:  03/22/2020 CLINICAL DATA:  Right proximal femur fracture. EXAM: OPERATIVE right HIP (WITH PELVIS IF PERFORMED) 6 VIEWS TECHNIQUE: Fluoroscopic spot image(s) were submitted for interpretation post-operatively. FLUOROSCOPY TIME:  2 minutes 5 seconds. COMPARISON:  March 21, 2020. FINDINGS: Six intraoperative fluoroscopic images were obtained of the right hip and femur. These images demonstrate surgical internal fixation of proximal right femoral fracture with intramedullary rod fixation of the right femoral shaft. Improved alignment of fracture components is noted. IMPRESSION: Status post surgical internal fixation of proximal right femoral fracture. Electronically Signed   By: Marijo Conception M.D.   On: 03/22/2020 13:58   DG Femur Min 2 Views Right  Result Date: 03/21/2020 CLINICAL DATA:  Preoperative study EXAM: RIGHT FEMUR 2 VIEWS COMPARISON:  None. FINDINGS: A displaced comminuted intertrochanteric fracture seen in the right hip. The remainder of the femur is intact. The proximal tibia and fibula are intact. No femoral head dislocation. A fracture through the left inferior pubic ramus is not excluded. No other acute abnormalities. IMPRESSION: 1. The patient has a displaced comminuted fracture through the right femoral intertrochanteric region without dislocation. 2. A nondisplaced left inferior pubic ramus fracture is not excluded. 3. No other abnormalities. Electronically Signed   By: Dorise Bullion III M.D   On: 03/21/2020 09:11    DISCHARGE EXAMINATION: Please see the progress note from earlier today  DISPOSITION: SNF in [place]  Discharge Instructions    Call MD for:  difficulty breathing, headache or visual disturbances   Complete by: As directed    Call MD for:  extreme fatigue   Complete by: As directed    Call MD for:  persistant dizziness or light-headedness   Complete by: As directed    Call MD for:  persistant nausea and vomiting   Complete by: As directed    Call MD for:  severe  uncontrolled pain   Complete by: As directed    Call MD for:  temperature >100.4   Complete by: As directed    Discharge instructions   Complete by: As directed    Please review instructions on the discharge summary.  You were cared for by a hospitalist during your hospital stay. If you have any questions about your discharge medications or the care you received while you were in the hospital after you are discharged, you can call the unit and asked to speak with the hospitalist on call if the hospitalist that took care of you is not available. Once you are discharged, your primary care physician will handle any further medical issues. Please note that NO REFILLS for any discharge medications will be authorized once you are discharged, as it is imperative that you return to your primary care physician (or establish a relationship with a primary care physician if you do not have one) for your aftercare needs so that they can reassess your need for medications and monitor your lab values. If you do not have a primary care physician, you can call [phone] for a physician referral.   Increase activity slowly   Complete by: As directed    No wound care   Complete by: As directed         Allergies as of 03/31/2020      Reactions   Codeine    With codeine      Medication List    STOP taking these medications   losartan 100 MG tablet Commonly known as: COZAAR     TAKE these medications   acetaminophen 500  MG tablet Commonly known as: TYLENOL Take 500 mg by mouth every 6 (six) hours as needed for headache.   calcium acetate 667 MG capsule Commonly known as: PHOSLO Take 667 mg by mouth 2 (two) times daily.   cetirizine 10 MG tablet Commonly known as: ZYRTEC Take 10 mg by mouth daily at 6 (six) AM.   docusate sodium 100 MG capsule Commonly known as: COLACE Take 1 capsule (100 mg total) by mouth 2 (two) times daily.   Eliquis 2.5 MG Tabs tablet Generic drug: apixaban Take 2.5 mg  by mouth 2 (two) times daily.   feeding supplement (ENSURE ENLIVE) Liqd Take 237 mLs by mouth 3 (three) times daily between meals.   levothyroxine 75 MCG tablet Commonly known as: SYNTHROID Take 75 mcg by mouth daily.   metoprolol succinate 25 MG 24 hr tablet Commonly known as: TOPROL-XL Take 1 tablet (25 mg total) by mouth daily. Start taking on: April 01, 2020 What changed:   medication strength  how much to take   multivitamin capsule Take 1 capsule by mouth daily.   oxyCODONE 5 MG immediate release tablet Commonly known as: Oxy IR/ROXICODONE Take 0.5-1 tablets (2.5-5 mg total) by mouth every 4 (four) hours as needed for moderate pain or severe pain (pain score 4-6).   polyethylene glycol 17 g packet Commonly known as: MIRALAX / GLYCOLAX Take 17 g by mouth 2 (two) times daily.   senna-docusate 8.6-50 MG tablet Commonly known as: Senokot-S Take 2 tablets by mouth daily. Start taking on: April 01, 2020   torsemide 10 MG tablet Commonly known as: DEMADEX Take 1 tablet (10 mg total) by mouth daily. Start taking on: April 01, 2020   traMADol 50 MG tablet Commonly known as: ULTRAM Take 1 tablet (50 mg total) by mouth every 6 (six) hours as needed for moderate pain.          TOTAL DISCHARGE TIME: 35 minutes  Faith Patricelli [employer] on [email or web address]  03/31/2020, 3:45 PM

## 2020-03-31 NOTE — Progress Notes (Addendum)
Daily Progress Note   Patient Name: Rebecca Cruz       Date: 03/31/2020 DOB: 11-27-1923  Age: 86 y.o. MRN#: [id number] Attending Physician: Bonnielee Haff, MD Primary Care Physician: Patient, No Pcp Per Admit Date: 03/21/2020  Reason for Consultation/Follow-up: Establishing goals of care  Subjective: Patient is resting in bed, her son and DIL are at bedside. She denies complaint at this time. Son advises that her frailty and poor prognosis has been discussed with them. They are discussing transporting her home so that she can visit with family, and the different options available. Son is working towards air transport.  He discusses rehab with transition to comfort focused care. He advises the facility she is discharging to offers hospice.   They confirm DNR/DNI, no feeding tube.  Given her severe AS, difficulty with fluid balance, and poor PO intake, she is a candidate for hospice facility placement if she desires.    Length of Stay: 10  Current Medications: Scheduled Meds:  . apixaban  2.5 mg Oral BID  . calcium acetate  667 mg Oral BID WC  . Chlorhexidine Gluconate Cloth  6 each Topical Daily  . docusate sodium  100 mg Oral BID  . feeding supplement (ENSURE ENLIVE)  237 mL Oral TID BM  . levothyroxine  75 mcg Oral Q0600  . melatonin  5 mg Oral QHS  . metoprolol succinate  25 mg Oral Daily  . multivitamin with minerals  1 tablet Oral Daily  . polyethylene glycol  17 g Oral BID  . senna-docusate  2 tablet Oral Daily  . torsemide  10 mg Oral Daily    Continuous Infusions: . albumin human 12.5 g (03/31/20 1056)  . methocarbamol (ROBAXIN) IV      PRN Meds: acetaminophen, bisacodyl, methocarbamol **OR** methocarbamol (ROBAXIN) IV, ondansetron **OR** ondansetron (ZOFRAN) IV,  sodium phosphate, traMADol  Physical Exam Pulmonary:     Effort: Pulmonary effort is normal.  Neurological:     Mental Status: She is alert.             Vital Signs: BP (!) 116/58 (BP Location: Left Arm)   Pulse 75   Temp (!) 97.3 F (36.3 C) (Oral)   Resp (!) 22   Ht 5' (1.524 m)   Wt 51.5 kg   SpO2 93%   BMI  22.19 kg/m  SpO2: SpO2: 93 % O2 Device: O2 Device: Room Air O2 Flow Rate: O2 Flow Rate (L/min): 2 L/min  Intake/output summary:   Intake/Output Summary (Last 24 hours) at 03/31/2020 1510 Last data filed at 03/31/2020 1057 Gross per 24 hour  Intake --  Output 1475 ml  Net -1475 ml   LBM: Last BM Date: 03/29/20 Baseline Weight: Weight: 51.5 kg Most recent weight: Weight: 51.5 kg       Palliative Assessment/Data: 20%      Patient Active Problem List   Diagnosis Date Noted  . Acute heart failure with preserved ejection fraction (HFpEF) ([place])   . Severe aortic stenosis   . Elevated troponin   . Leukocytosis   . Protein-calorie malnutrition, severe 03/22/2020  . Intertrochanteric fracture of right femur, closed, initial encounter ([place]) 03/21/2020  . Hypertension   . Thyroid disease   . Intertrochanteric fracture of right femur ([place])   . Atrial fibrillation with RVR ([place])   . Chronic kidney disease, stage 4 (severe) [facility])     Palliative Care Assessment & Plan    Recommendations/Plan:  Recommend palliative to follow if patient discharges to SNF, and transition to hospice and long term care when ready.  Patient is hospice facility appropriate if she desires.   Code Status:    Code Status Orders  (From admission, onward)         Start     Ordered   03/21/20 1047  Do not attempt resuscitation (DNR)  Continuous       Question Answer Comment  In the event of cardiac or respiratory ARREST Do not call a "code blue"   In the event of cardiac or respiratory ARREST Do not perform Intubation, CPR, defibrillation or ACLS   In the event of cardiac or  respiratory ARREST Use medication by any route, position, wound care, and other measures to relive pain and suffering. May use oxygen, suction and manual treatment of airway obstruction as needed for comfort.   Comments Discussed code status with patient at the bedside and she wishes to be placed in a DO NOT RESUSCITATE status      03/21/20 1047        Code Status History    Date Active Date Inactive Code Status Order ID Comments User Context   03/21/2020 1046 03/21/2020 1047 DNR [id number]  Collier Bullock, MD ED   03/21/2020 1014 03/21/2020 1046 Full Code [id number]  Collier Bullock, MD ED   Advance Care Planning Activity       Prognosis:   < 2 weeks  Care plan was discussed with CM  Thank you for allowing the Palliative Medicine Team to assist in the care of this patient.   Total Time 35 min Prolonged Time Billed  no       Greater than 50%  of this time was spent counseling and coordinating care related to the above assessment and plan.  Asencion Gowda, NP  Please contact Palliative Medicine Team phone at [phone] for questions and concerns.

## 2020-03-31 NOTE — Progress Notes (Signed)
°  Speech Language Pathology Treatment: Dysphagia  Patient Details Name: Rebecca Cruz MRN: [id number] DOB: Apr 05, 1924 Today's Date: 03/31/2020 Time: 7846-9629 SLP Time Calculation (min) (ACUTE ONLY): 24 min  Assessment / Plan / Recommendation Clinical Impression  Skilled treatment session addressed pt's ongoing dysphagia management. Pt is more lethargic and drifts off to sleep instantly and is now on 2L O2 via [place]. This is a change over yesterday's session. Suspect that pt's coughing when consuming thin liquids during this session is likely a result of the above. Pt's cough was consistent after each small sip of thin liquids. Nectar thick liquids were trialed at bedside with pt consuming via straw with no overt s/s of aspiration. While silent aspiration cannot be ruled out at bedside, recommend downgrading pt's diet to dysphagia 2 for energy conservation and nectar thick liquids via straw to reduce overt s/s of aspiration. At this time, uncertain if an instrumental study aligns with pt and family's GOC. Given pt's fluctuating alertness levels across her hospitalization, not sure if an instrumental study will provide valid results that be applied throughout pt's daily abilities. Education provided to pt's attending MD, who will discuss with pt's family. ST to follow.     HPI HPI: Per admitting H and P: "Rebecca Cruz is a 86 y.o. female with medical history significant for hypertension, hypothyroidism, stage IV chronic kidney disease and atrial fibrillation on chronic anticoagulation therapy who is visiting family in the area. She was brought in to the ER by EMS for evaluation of right hip pain. Patient ambulates with a walker and stated that she lost her balance and fell on the floor inside the house. She was able to get up and get back in bed but this morning was unable to get out of bed.  She is noted to have swelling in the right upper thigh as well as shortening and internal rotation of her  right leg."      SLP Plan  Continue with current plan of care       Recommendations  Diet recommendations: Dysphagia 2 (fine chop);Nectar-thick liquid Liquids provided via: Straw Medication Administration: Whole meds with puree Supervision: Staff to assist with self feeding;Intermittent supervision to cue for compensatory strategies Compensations: Minimize environmental distractions;Slow rate;Small sips/bites Postural Changes and/or Swallow Maneuvers: Seated upright 90 degrees                Oral Care Recommendations: Oral care BID Follow up Recommendations: Skilled Nursing facility SLP Visit Diagnosis: Dysphagia, oropharyngeal phase (R13.12) Plan: Continue with current plan of care       GO               Rebecca Cruz B. Rutherford Nail M.S., CCC-SLP, [name] Office [phone]  Stormy Fabian 03/31/2020, 9:51 AM

## 2020-03-31 NOTE — Progress Notes (Addendum)
PROGRESS NOTE   Rebecca Cruz  HCW:[id number]    DOB: 02-07-1924    DOA: 03/21/2020  PCP: Patient, No Pcp Per   I have briefly reviewed patients previous medical records in [facility].  Chief Complaint  Patient presents with   Fall    Brief Narrative:  86 year old female with PMH of hypertension, hypothyroidism, stage IV chronic kidney disease, atrial fibrillation on chronic anticoagulation, originally from [place], visiting family in the [place] area, presented to ED via EMS for evaluation of right hip pain.  S/p IM intertrochanteric nail on 6/28 for intertrochanteric fracture of right femur.  Hospital course complicated by ecchymosis of right medial thigh possibly related to surgical manipulation, some acute blood loss anemia, acute kidney injury, acute urinary retention, AMS and overall slow progress.  Hematology consulted for leukocytosis.  RLE ecchymosis, ABLA stabilized.  Creatinine fluctuating?  Element of cardiorenal syndrome.  Mild CHF/coughing-IV Lasix.  Urinary retention recurrent, Foley catheter placed back.  AMS better but not resolved.  Nephrology consulted due to acute kidney injury and cardiology for acute CHF.   Assessment & Plan:   Intertrochanteric fracture of right femur, s/p IM nail 6/28.   Sustained a fall and had a displaced comminuted fracture.  Status post IM nail on 6/28.   Orthopedics has been following.  PT and OT evaluation.  Skilled nursing facility is recommended.   Hospital course complicated by extensive right medial thigh bruising extending from mons pubis/groin mostly up to the back of the right knee in a bandlike fashion, some of it tracking faintly to the right ankle.   Per orthopedics, this was possibly related to some manipulation of the medial aspect of the extremity during surgery which may have precipitated some bleeding.  Eliquis temporarily held for about 48 hours, resumed 7/3.  Pain management-judicious use of opioids.   From an  orthopedic standpoint.  Acute diastolic CHF/Severe Aortic Stenosis Noted to be in fluid overload on 7/3.  She was given Lasix.   TTE in care everywhere on 10/04/2018: LVEF 70+%, mild LVH.  Possible LV relaxation abnormality.  Moderate to severe TR.  Pulmonary hypertension.  PFO/ASD with left-to-right shunting.   Echocardiogram was repeated during this hospital stay and shows preserved EF.  No evidence for diastolic dysfunction noted.  Significant aortic valve stenosis however was noted.  Cardiology has been following.  Lasix was changed over to torsemide.  She is also getting albumin per nephrology.  No further inpatient work-up recommended.  Noted to be on oxygen this morning.  We will do a portable chest x-ray.  Possible nonsustained VT versus brief run of A. fib This morning she had an asymptomatic brief run of nonsustained ventricular tachycardia.  Potassium is noted to be normal at 4.1.  Magnesium was 2.1.  Continue to monitor.  Elevated troponin Troponin is noted to be minimally elevated.  Cardiology is following.  Echocardiogram does not show any wall motion abnormalities.  Mild LVH was noted.  Does not appear as if cardiology plans to do any further work-up.    AKI on chronic kidney disease stage IV  Creatinine 0.9 on admission.  Since admission creatinine has been fluctuating up and down.  May be multifactorial due to fluctuating and poor oral intake, mild rhabdo and cardiorenal.  Briefly hydrated with IV fluids then stopped due to volume overload. Renal ultrasound without hydronephrosis. Subsequently nephrology was consulted.  Patient received IV albumin along with diuretics with good response.  Changed over to torsemide.  Renal function appears  to be improving.     Permanent atrial fibrillation Continue reduced dose of Toprol-XL and Eliquis resumed after temporary hold for 48 hours.  No overt bleeding.  Essential hypertension Losartan held due to acute kidney injury.  Blood pressures  have improved after cutting back Toprol-XL from 50 to 25 mg daily.  Hypothyroid Continue Synthroid.  Postop acute blood loss anemia Drop in hemoglobin likely due to surgical blood loss and bruising noted in the right lower extremity.  For the past several days hemoglobin has been stable.  Transfuse if it drops below 7.    Leukocytosis WBC 18 on presentation which progressively increased, 22.3 > 26.7 > 32.6.  Possible stress response.  No obvious source of infection is identified.  According to family patient has a history of elevated WBC.  WBC noted to be better this morning. Hematology was also consulted and they do suspect that it is likely reactive.  Peripheral smear without concerning features.  Pathologist review of the smear low concern for leukemia.   This may need further management in the outpatient setting.   Acute urinary retention, recurrent After 2 attempts of in and out catheterization last night, eventually had Foley catheter placed 7/1.  Foley catheter discontinued on 7/3.  But developed recurrent urinary retention and Foley catheter placed back 7/5. Persistent urinary retention most likely due to the fact that she has been nonambulatory for such a long period of time.   Would recommend that the Foley catheter be kept in until patient is able to get up and ambulate with physical therapy.  Acute metabolic encephalopathy:  Likely multifactorial related to pain, pain medicines, hospitalization in a very elderly female patient who may have some underlying cognitive impairment.  Supportive care, minimize opioids, redirect.   Body mass index is 22.19 kg/m.  Severe protein calorie malnutrition Nutrition Problem: Severe Malnutrition Etiology: chronic illness (CKD) Signs/Symptoms: severe fat depletion, severe muscle depletion Interventions: Refer to RD note for recommendations   Adult failure to thrive: Multifactorial due to advanced age and multiple severe acute and chronic  comorbidities as above.  Discussed in detail with son and updated current status and how ill she is.  Palliative care medicine consulted for goals of care.  DVT prophylaxis: Eliquis Code Status: DNR Family Communication: Discussed with son this morning. Disposition: Status is: Inpatient  Remains inpatient appropriate because:IV treatments appropriate due to intensity of illness or inability to take PO   Dispo:  Patient From: Home  Planned Disposition: [place]  Expected discharge date: 04/02/20  Medically stable for discharge: No         Consultants:   Orthopedic Hematology Nephrology Cardiology Palliative  Procedures:   S/p right hip fracture IM nail 6/28 Foley catheter 7/1-7/3, 7/5 greater than  Antimicrobials:   None   Subjective:  Patient slightly lethargic but easily arousable.  Follows commands.  Denies any chest pain.  No shortness of breath.  Objective:   Vitals:   03/30/20 2344 03/31/20 0813 03/31/20 1059 03/31/20 1159  BP: 124/67 (!) 110/93  (!) 116/58  Pulse: 77 68  75  Resp: 18 19  (!) 22  Temp: 98.1 F (36.7 C) 97.9 F (36.6 C)  (!) 97.3 F (36.3 C)  TempSrc: Oral Oral  Oral  SpO2: 99% (!) 2% 94% 93%  Weight:      Height:        General appearance: Awake alert.  In no distress.  Mildly distracted Resp: Diminished air entry at the bases.  Normal effort noted at rest.  Few crackles.  No wheezing or rhonchi.  Cardio: S1-S2 is normal regular.  No S3-S4.  No rubs murmurs or bruit.  Telemetry shows a brief run of NSVT. GI: Abdomen is soft.  Nontender nondistended.  Bowel sounds are present normal.  No masses organomegaly Extremities: No edema.  Neurologic: No focal neurological deficits.       Data Reviewed:     CBC: Recent Labs  Lab 03/25/20 0456 03/25/20 1501 03/29/20 0454 03/30/20 0005 03/31/20 0639  WBC 27.8*   < > 29.2* 28.2* 18.2*  NEUTROABS 20.0*  --   --   --   --   HGB 8.6*   < > 8.8* 9.0* 8.6*  HCT 27.6*    < > 28.4* 29.5* 28.3*  MCV 100.7*   < > 97.9 100.0 101.1*  PLT 375   < > 376 384 312   < > = values in this interval not displayed.    Basic Metabolic Panel: Recent Labs  Lab 03/25/20 0456 03/26/20 0632 03/29/20 0454 03/30/20 0005 03/31/20 0639  NA 139   < > 138 137 139  K 4.2   < > 5.0 4.2 4.1  CL 109   < > 108 104 104  CO2 22   < > 24 21* 25  GLUCOSE 106*   < > 101* 129* 100*  BUN 55*   < > 75* 67* 69*  CREATININE 1.14*   < > 1.50* 1.42* 1.24*  CALCIUM 7.8*   < > 7.9* 8.1* 8.3*  MG 2.0  --   --   --  2.1   < > = values in this interval not displayed.    Liver Function Tests: Recent Labs  Lab 03/29/20 0454  ALBUMIN 2.5*     Microbiology Studies:   No results found for this or any previous visit (from the past 240 hour(s)).   Radiology Studies:  ECHOCARDIOGRAM COMPLETE  Result Date: 03/30/2020    ECHOCARDIOGRAM REPORT   Patient Name:   Rebecca Cruz Date of Exam: 03/30/2020 Medical Rec #:  [id number]          Height:       60.0 in Accession #:    [id number]         Weight:       113.6 lb Date of Birth:  1923/10/08           BSA:          1.468 m Patient Age:    42 years           BP:           96/52 mmHg Patient Gender: F                  HR:           79 bpm. Exam Location:  [facility] Procedure: 2D Echo, Cardiac Doppler and Color Doppler Indications:     CHF-acute diastolic 417.40  History:         Patient has no prior history of Echocardiogram examinations.                  Risk Factors:Hypertension.  Sonographer:     Sherrie Sport RDCS (AE) Referring Phys:  [place] Diagnosing Phys: Bartholome Bill MD IMPRESSIONS  1. Left ventricular ejection fraction, by estimation, is 70 to 75%. The left ventricle has normal function. The left ventricle has no regional wall motion abnormalities. There is  mild left ventricular hypertrophy. Left ventricular diastolic parameters were normal.  2. Right ventricular systolic function is normal. The right ventricular size is mildly  enlarged. There is severely elevated pulmonary artery systolic pressure.  3. Right atrial size was mildly dilated.  4. The mitral valve is grossly normal. Mild mitral valve regurgitation.  5. Tricuspid valve regurgitation is moderate.  6. The aortic valve was not well visualized. Aortic valve regurgitation is trivial. Severe aortic valve stenosis. FINDINGS  Left Ventricle: Left ventricular ejection fraction, by estimation, is 70 to 75%. The left ventricle has normal function. The left ventricle has no regional wall motion abnormalities. The left ventricular internal cavity size was normal in size. There is  mild left ventricular hypertrophy. Left ventricular diastolic parameters were normal. Right Ventricle: The right ventricular size is mildly enlarged. No increase in right ventricular wall thickness. Right ventricular systolic function is normal. There is severely elevated pulmonary artery systolic pressure. The tricuspid regurgitant velocity is 4.13 m/s, and with an assumed right atrial pressure of 10 mmHg, the estimated right ventricular systolic pressure is 14.4 mmHg. Left Atrium: Left atrial size was normal in size. Right Atrium: Right atrial size was mildly dilated. Pericardium: There is no evidence of pericardial effusion. Mitral Valve: The mitral valve is grossly normal. Mild mitral valve regurgitation. Tricuspid Valve: The tricuspid valve is grossly normal. Tricuspid valve regurgitation is moderate. Aortic Valve: The aortic valve was not well visualized. Aortic valve regurgitation is trivial. Severe aortic stenosis is present. Aortic valve mean gradient measures 52.0 mmHg. Aortic valve peak gradient measures 81.5 mmHg. Aortic valve area, by VTI measures 0.46 cm. Pulmonic Valve: The pulmonic valve was not well visualized. Pulmonic valve regurgitation is not visualized. Aorta: The aortic root was not well visualized. IAS/Shunts: The interatrial septum was not assessed.  LEFT VENTRICLE PLAX 2D LVIDd:          3.55 cm LVIDs:         1.90 cm LV PW:         1.40 cm LV IVS:        0.91 cm LVOT diam:     1.90 cm LV SV:         46 LV SV Index:   31 LVOT Area:     2.84 cm  RIGHT VENTRICLE RV Basal diam:  4.23 cm RV S prime:     8.81 cm/s TAPSE (M-mode): 2.5 cm LEFT ATRIUM           Index       RIGHT ATRIUM           Index LA diam:      3.60 cm 2.45 cm/m  RA Area:     18.70 cm LA Vol (A2C): 42.5 ml 28.96 ml/m RA Volume:   62.10 ml  42.31 ml/m LA Vol (A4C): 49.1 ml 33.45 ml/m  AORTIC VALVE                    PULMONIC VALVE AV Area (Vmax):    0.47 cm     PV Vmax:        0.85 m/s AV Area (Vmean):   0.47 cm     PV Peak grad:   2.9 mmHg AV Area (VTI):     0.46 cm     RVOT Peak grad: 3 mmHg AV Vmax:           451.33 cm/s AV Vmean:          341.000 cm/s AV  VTI:            1.001 m AV Peak Grad:      81.5 mmHg AV Mean Grad:      52.0 mmHg LVOT Vmax:         75.10 cm/s LVOT Vmean:        56.800 cm/s LVOT VTI:          0.161 m LVOT/AV VTI ratio: 0.16  AORTA Ao Root diam: 2.20 cm MITRAL VALVE                TRICUSPID VALVE MV Area (PHT): 2.92 cm     TR Peak grad:   68.2 mmHg MV Decel Time: 260 msec     TR Vmax:        413.00 cm/s MV E velocity: 136.00 cm/s                             SHUNTS                             Systemic VTI:  0.16 m                             Systemic Diam: 1.90 cm Bartholome Bill MD Electronically signed by Bartholome Bill MD Signature Date/Time: 03/30/2020/8:32:04 AM    Final      Scheduled Meds:    apixaban  2.5 mg Oral BID   calcium acetate  667 mg Oral BID WC   Chlorhexidine Gluconate Cloth  6 each Topical Daily   docusate sodium  100 mg Oral BID   feeding supplement (ENSURE ENLIVE)  237 mL Oral TID BM   levothyroxine  75 mcg Oral Q0600   melatonin  5 mg Oral QHS   metoprolol succinate  25 mg Oral Daily   multivitamin with minerals  1 tablet Oral Daily   polyethylene glycol  17 g Oral BID   senna-docusate  2 tablet Oral Daily   torsemide  10 mg Oral Daily    Continuous  Infusions:    albumin human 12.5 g (03/31/20 1056)   methocarbamol (ROBAXIN) IV       LOS: 10 days     [place] [facility]    To contact the attending provider between 7A-7P or the covering provider during after hours 7P-7A, please log into the web site [email or web address] and access using universal [place] password for that web site. If you do not have the password, please call the hospital operator.  03/31/2020, 12:31 PM

## 2020-03-31 NOTE — Progress Notes (Signed)
Physical Therapy Treatment Patient Details Name: Rebecca Cruz MRN: [id number] DOB: [birth year] Today's Date: 03/31/2020    History of Present Illness Pt is a 86 y.o. female presenting to hospital 6/27 s/p fall (tripped and fell last night) sustaining R hip pain.  Imaging showing: "Displaced comminuted fracture through the right femoral intertrochanteric region. A nondisplaced fracture through the right inferior pubic ramus is not excluded."  Per MD Posey Pronto note (ortho): "We will additionally plan to treat possible right inferior pubic ramus fracture non-operatively as there are no other signs of significant pelvic ring injury."  Pt s/p IM nailing of R femur with cephalomedullary device 6/28 secondary R intertrochanteric hip fx.  Significant ecchymosis noted post-op; medical staff aware and monitoring. PMH includes htn, thyroid disease, aortic valve repair, appendectomy, abdominal hysterectomy, stage IV CKD, a-fib.    PT Comments    Pt pleasant and motivated to participate during the session. Pt is progressing well with bed mobility and during today's session was able to help move her R leg to the EOB to assist with supine-sit. Pt continues to have difficulty with sitting balance, with a significant posterior lean noted in this position. Pt was able to decrease posterior lean after performing anterior weight shifting exercises in a seated position. Pt continues to progress with gait, and was able to bear weight through RLE long enough to advance the LLE 2-3x before appearing limited by pain and fatigue. Pt required mod cueing for sequencing with both bed mobility and gait. Pt will benefit from PT services in a SNF setting upon discharge to safely address deficits listed in patient problem list for decreased caregiver assistance and eventual return to PLOF.   Follow Up Recommendations  SNF     Equipment Recommendations  Rolling walker with 5" wheels;3in1 (PT)    Recommendations for Other Services        Precautions / Restrictions Precautions Precautions: Fall Precaution Comments: Progress activity slowly per cardiology; amb bed to chair ok Restrictions Weight Bearing Restrictions: Yes RLE Weight Bearing: Weight bearing as tolerated    Mobility  Bed Mobility Overal bed mobility: Needs Assistance Bed Mobility: Supine to Sit;Sit to Supine     Supine to sit: Mod assist Sit to supine: Max assist   General bed mobility comments: mod-max assist for BLE and trunk control  Transfers Overall transfer level: Needs assistance Equipment used: Rolling walker (2 wheeled) Transfers: Sit to/from Stand Sit to Stand: Mod assist;Max assist         General transfer comment: patient needed cueing for anterior weight shift secondary to posterior lean sitting in bed  Ambulation/Gait Ambulation/Gait assistance: Mod assist Gait Distance (Feet): 2 Feet Assistive device: Rolling walker (2 wheeled) Gait Pattern/deviations: Step-to pattern;Decreased step length - left;Decreased stance time - right;Antalgic Gait velocity: decreased   General Gait Details: pt was able to bear weight through RLE and advance LLE 2-3x before unable likely secondary to pain   Stairs             Wheelchair Mobility    Modified Rankin (Stroke Patients Only)       Balance Overall balance assessment: Needs assistance Sitting-balance support: Bilateral upper extremity supported;Feet supported Sitting balance-Leahy Scale: Poor Sitting balance - Comments: min posterior lean sitting at EOB that improved after anterior weight shifting activities Postural control: Posterior lean Standing balance support: Bilateral upper extremity supported;During functional activity Standing balance-Leahy Scale: Poor Standing balance comment: Posterior instability in standing requiring occasional physical assist to prevent LOB  Cognition Arousal/Alertness: Awake/alert Behavior  During Therapy: WFL for tasks assessed/performed Overall Cognitive Status: No family/caregiver present to determine baseline cognitive functioning                                        Exercises Total Joint Exercises Ankle Circles/Pumps: AROM;Strengthening;Both;10 reps Quad Sets: Strengthening;Both;10 reps Heel Slides: AAROM;Strengthening;10 reps;Both Hip ABduction/ADduction: Strengthening;Both;10 reps;AAROM Straight Leg Raises: AAROM;Strengthening;10 reps;Both Long Arc Quad: AROM;Strengthening;10 reps;Both Knee Flexion: AROM;Strengthening;Both;10 reps Marching in Standing: AROM;Both;5 reps Other Exercises Other Exercises: seated anterior weight shifting activities    General Comments        Pertinent Vitals/Pain Pain Assessment: Faces Pain Score: 7  Faces Pain Scale: Hurts a little bit Pain Location: RLE Pain Descriptors / Indicators: Guarding Pain Intervention(s): Premedicated before session;Monitored during session;Repositioned    Home Living                      Prior Function            PT Goals (current goals can now be found in the care plan section) Progress towards PT goals: Progressing toward goals    Frequency    BID      PT Plan Current plan remains appropriate    Co-evaluation              AM-PAC PT "6 Clicks" Mobility   Outcome Measure  Help needed turning from your back to your side while in a flat bed without using bedrails?: A Lot Help needed moving from lying on your back to sitting on the side of a flat bed without using bedrails?: A Lot Help needed moving to and from a bed to a chair (including a wheelchair)?: A Lot Help needed standing up from a chair using your arms (e.g., wheelchair or bedside chair)?: A Lot Help needed to walk in hospital room?: Total Help needed climbing 3-5 steps with a railing? : Total 6 Click Score: 10    End of Session Equipment Utilized During Treatment: Gait  belt;Oxygen Activity Tolerance: Patient limited by pain Patient left: in bed;with call bell/phone within reach;with bed alarm set;with nursing/sitter in room;with SCD's reapplied Nurse Communication: Mobility status PT Visit Diagnosis: Other abnormalities of gait and mobility (R26.89);Muscle weakness (generalized) (M62.81);History of falling (Z91.81);Difficulty in walking, not elsewhere classified (R26.2);Pain Pain - Right/Left: Right Pain - part of body: Hip;Leg     Time: 1275-1700 PT Time Calculation (min) (ACUTE ONLY): 34 min  Charges:  $Gait Training: 8-22 mins $Therapeutic Exercise: 8-22 mins                     D. Scott Iline Buchinger PT, DPT 03/31/20, 10:54 AM

## 2020-03-31 NOTE — Progress Notes (Signed)
Notified Dr. Maryland Pink that [facility] called stating patient had 14 beat run of afib RVR with HR up to 150s and her QRS widened to 0.21 with her normal being 0.13. MD acknowledged. No new orders at this time. Will continue to monitor.

## 2020-03-31 NOTE — Progress Notes (Signed)
Physical Therapy Treatment Patient Details Name: Rebecca Cruz MRN: [id number] DOB: Jan 27, 1924 Today's Date: 03/31/2020    History of Present Illness Pt is a 86 y.o. female presenting to hospital 6/27 s/p fall (tripped and fell last night) sustaining R hip pain.  Imaging showing: "Displaced comminuted fracture through the right femoral intertrochanteric region. A nondisplaced fracture through the right inferior pubic ramus is not excluded."  Per MD Posey Pronto note (ortho): "We will additionally plan to treat possible right inferior pubic ramus fracture non-operatively as there are no other signs of significant pelvic ring injury."  Pt s/p IM nailing of R femur with cephalomedullary device 6/28 secondary R intertrochanteric hip fx.  Significant ecchymosis noted post-op; medical staff aware and monitoring. PMH includes htn, thyroid disease, aortic valve repair, appendectomy, abdominal hysterectomy, stage IV CKD, a-fib.    PT Comments    Pt pleasant and motivated to participate during the session.  Pt put forth good effort with below therex without c/o adverse symptoms other than 6-8/10 RLE pain.  Per cardiology request that activity be progressed slowly PM session limited to supine therex only after mobility/gait training during AM session.  Pt will benefit from PT services in a SNF setting upon discharge to safely address deficits listed in patient problem list for decreased caregiver assistance and eventual return to PLOF.     Follow Up Recommendations  SNF     Equipment Recommendations  Rolling walker with 5" wheels;3in1 (PT)    Recommendations for Other Services       Precautions / Restrictions Precautions Precautions: Fall Precaution Comments: Progress activity slowly per cardiology; amb bed to chair ok Restrictions Weight Bearing Restrictions: Yes RLE Weight Bearing: Weight bearing as tolerated    Mobility  Bed Mobility               General bed mobility comments: NT,  supine therex only this session  Transfers                    Ambulation/Gait                 Stairs             Wheelchair Mobility    Modified Rankin (Stroke Patients Only)       Balance                                            Cognition Arousal/Alertness: Awake/alert Behavior During Therapy: WFL for tasks assessed/performed Overall Cognitive Status: No family/caregiver present to determine baseline cognitive functioning                                        Exercises Total Joint Exercises Ankle Circles/Pumps: AROM;Strengthening;Both;10 reps;5 reps Quad Sets: Strengthening;Both;10 reps Gluteal Sets: Strengthening;Both;10 reps Short Arc Quad: Strengthening;10 reps;Both;Other (comment) (with gentle manual resistance.) Heel Slides: AAROM;Strengthening;10 reps;Both Hip ABduction/ADduction: Strengthening;Both;10 reps;AAROM Straight Leg Raises: AAROM;Strengthening;10 reps;Both    General Comments        Pertinent Vitals/Pain Pain Assessment: 0-10 Pain Score: 6  Pain Location: RLE Pain Descriptors / Indicators: Guarding;Sore Pain Intervention(s): Monitored during session;Patient requesting pain meds-RN notified;RN gave pain meds during session    Home Living  Prior Function            PT Goals (current goals can now be found in the care plan section) Progress towards PT goals: Progressing toward goals    Frequency    BID      PT Plan Current plan remains appropriate    Co-evaluation              AM-PAC PT "6 Clicks" Mobility   Outcome Measure  Help needed turning from your back to your side while in a flat bed without using bedrails?: A Lot Help needed moving from lying on your back to sitting on the side of a flat bed without using bedrails?: A Lot Help needed moving to and from a bed to a chair (including a wheelchair)?: A Lot Help needed standing up  from a chair using your arms (e.g., wheelchair or bedside chair)?: A Lot Help needed to walk in hospital room?: Total Help needed climbing 3-5 steps with a railing? : Total 6 Click Score: 10    End of Session Equipment Utilized During Treatment: Oxygen Activity Tolerance: Patient tolerated treatment well Patient left: in bed;with call bell/phone within reach;with bed alarm set;with nursing/sitter in room;with SCD's reapplied;with family/visitor present Nurse Communication: Mobility status PT Visit Diagnosis: Other abnormalities of gait and mobility (R26.89);Muscle weakness (generalized) (M62.81);History of falling (Z91.81);Difficulty in walking, not elsewhere classified (R26.2);Pain Pain - Right/Left: Right Pain - part of body: Hip;Leg     Time: 2863-8177 PT Time Calculation (min) (ACUTE ONLY): 19 min  Charges:  $Therapeutic Exercise: 8-22 mins                    D. Royetta Asal PT, DPT 03/31/20, 4:23 PM

## 2020-03-31 NOTE — Progress Notes (Signed)
Patient re-examined today. Notes some mild shortness of breath. RLE not significantly painful. RLE exam relatively unchanged with ecchymoses about medial and posterior aspect of RLE extending towards ankle and proximally extending to include pubis. There is no significant pain about thigh or leg to palpation, and tissue remains soft. Incisions appear to be healing relatively well as wounds are clean and intact. There is no significant concern for surgical site infection. Hg has stabilized.   Patient continues to have multiple medical issues requiring prolonged hospital stay. Agree with and will defer to primary medical team for these concerns. I will plan to follow the patient peripherally while inpatient as she seems stable from an orthopedic standpoint. Please page or contact me or my team with any questions regarding this patient going forward.

## 2020-04-01 DIAGNOSIS — I272 Pulmonary hypertension, unspecified: Secondary | ICD-10-CM

## 2020-04-01 DIAGNOSIS — S72001A Fracture of unspecified part of neck of right femur, initial encounter for closed fracture: Secondary | ICD-10-CM

## 2020-04-01 DIAGNOSIS — N184 Chronic kidney disease, stage 4 (severe): Secondary | ICD-10-CM

## 2020-04-01 DIAGNOSIS — E43 Unspecified severe protein-calorie malnutrition: Secondary | ICD-10-CM

## 2020-04-01 LAB — CBC
HCT: 28.9 % — ABNORMAL LOW (ref 36.0–46.0)
Hemoglobin: 9 g/dL — ABNORMAL LOW (ref 12.0–15.0)
MCH: 30.6 pg (ref 26.0–34.0)
MCHC: 31.1 g/dL (ref 30.0–36.0)
MCV: 98.3 fL (ref 80.0–100.0)
Platelets: 279 10*3/uL (ref 150–400)
RBC: 2.94 MIL/uL — ABNORMAL LOW (ref 3.87–5.11)
RDW: 19.5 % — ABNORMAL HIGH (ref 11.5–15.5)
WBC: 13.6 10*3/uL — ABNORMAL HIGH (ref 4.0–10.5)
nRBC: 0.4 % — ABNORMAL HIGH (ref 0.0–0.2)

## 2020-04-01 LAB — BASIC METABOLIC PANEL
Anion gap: 10 (ref 5–15)
BUN: 66 mg/dL — ABNORMAL HIGH (ref 8–23)
CO2: 26 mmol/L (ref 22–32)
Calcium: 8.1 mg/dL — ABNORMAL LOW (ref 8.9–10.3)
Chloride: 102 mmol/L (ref 98–111)
Creatinine, Ser: 1.21 mg/dL — ABNORMAL HIGH (ref 0.44–1.00)
GFR calc Af Amer: 44 mL/min — ABNORMAL LOW (ref >60)
GFR calc non Af Amer: 38 mL/min — ABNORMAL LOW (ref >60)
Glucose, Bld: 85 mg/dL (ref 70–99)
Potassium: 4 mmol/L (ref 3.5–5.1)
Sodium: 138 mmol/L (ref 135–145)

## 2020-04-01 LAB — ALBUMIN: Albumin: 3.3 g/dL — ABNORMAL LOW (ref 3.5–5.0)

## 2020-04-01 MED ORDER — MAGIC MOUTHWASH
10.0000 mL | Freq: Three times a day (TID) | ORAL | Status: DC | PRN
  Filled 2020-04-01: qty 10

## 2020-04-01 MED ORDER — MORPHINE SULFATE (PF) 2 MG/ML IV SOLN
2.0000 mg | Freq: Once | INTRAVENOUS | Status: AC
  Administered 2020-04-01: 2 mg via INTRAVENOUS
  Filled 2020-04-01: qty 1

## 2020-04-01 NOTE — Progress Notes (Signed)
PROGRESS NOTE   Rebecca Cruz  UMP:[id number]    DOB: 1924-03-19    DOA: 03/21/2020  PCP: Patient, No Pcp Per   I have briefly reviewed patients previous medical records in [facility].  Chief Complaint  Patient presents with  . Fall    Brief Narrative:  86 year old female with PMH of hypertension, hypothyroidism, stage IV chronic kidney disease, atrial fibrillation on chronic anticoagulation, originally from [place], visiting family in the [place] area, presented to ED via EMS for evaluation of right hip pain.  S/p IM intertrochanteric nail on 6/28 for intertrochanteric fracture of right femur.  Hospital course complicated by ecchymosis of right medial thigh possibly related to surgical manipulation, some acute blood loss anemia, acute kidney injury, acute urinary retention, AMS and overall slow progress.  Hematology consulted for leukocytosis.  RLE ecchymosis, ABLA stabilized.  Creatinine fluctuating?  Element of cardiorenal syndrome.  Mild CHF/coughing-IV Lasix.  Urinary retention recurrent, Foley catheter placed back.  AMS better but not resolved.  Nephrology consulted due to acute kidney injury and cardiology for acute CHF.   Assessment & Plan:   Intertrochanteric fracture of right femur, s/p IM nail 6/28.   Sustained a fall and had a displaced comminuted fracture.  Status post IM nail on 6/28.   Orthopedics has been following.  PT and OT evaluation.  Skilled nursing facility is recommended.   Hospital course complicated by extensive right medial thigh bruising extending from mons pubis/groin mostly up to the back of the right knee in a bandlike fashion, some of it tracking faintly to the right ankle.   Per orthopedics, this was possibly related to some manipulation of the medial aspect of the extremity during surgery which may have precipitated some bleeding.  Eliquis temporarily held for about 48 hours, resumed 7/3.  Pain management-judicious use of opioids.   Remains  stable from an orthopedic standpoint.  Plan is for her to go to skilled nursing facility in [place].  Acute diastolic CHF/Severe Aortic Stenosis Noted to be in fluid overload on 7/3.  She was initially given Lasix.   TTE in care everywhere on 10/04/2018: LVEF 70+%, mild LVH.  Possible LV relaxation abnormality.  Moderate to severe TR.  Pulmonary hypertension.  PFO/ASD with left-to-right shunting.   Echocardiogram was repeated during this hospital stay and shows preserved EF.  No evidence for diastolic dysfunction noted.  Significant aortic valve stenosis however was noted.  Cardiology has been following.  Lasix was changed over to torsemide.  She is also getting albumin per nephrology.  No further inpatient work-up recommended.  Portable chest x-ray done yesterday continues to show vascular congestion and small pleural effusions.  Continue current management for now.  Possible nonsustained VT versus brief run of A. fib She has had a few brief runs of nonsustained VT.  She has been asymptomatic.Marland Kitchen  Electrolytes are normal.   Elevated troponin Troponin is noted to be minimally elevated.  Cardiology is following.  Echocardiogram does not show any wall motion abnormalities.  Mild LVH was noted.  Does not appear as if cardiology plans to do any further work-up.    AKI on chronic kidney disease stage IV  Creatinine 0.9 on admission.  Since admission creatinine has been fluctuating up and down.  May be multifactorial due to fluctuating and poor oral intake, mild rhabdo and cardiorenal.  Briefly hydrated with IV fluids then stopped due to volume overload. Renal ultrasound without hydronephrosis. Subsequently nephrology was consulted.  Patient received IV albumin along with diuretics  with good response.  Changed over to torsemide.  Renal function seems to be improving.  Monitor urine output.  Permanent atrial fibrillation Continue reduced dose of Toprol-XL and Eliquis resumed after temporary hold for 48  hours.  No overt bleeding.  Essential hypertension Losartan held due to acute kidney injury.  Blood pressures have improved after cutting back Toprol-XL from 50 to 25 mg daily.  Blood pressure is stable.  Hypothyroid Continue Synthroid.  Postop acute blood loss anemia Drop in hemoglobin likely due to surgical blood loss and bruising noted in the right lower extremity.  For the past several days hemoglobin has been stable.  Transfuse if it drops below 7.    Leukocytosis WBC 18 on presentation which progressively increased, 22.3 > 26.7 > 32.6.  Possible stress response.  No obvious source of infection is identified.  According to family patient has a history of elevated WBC.  WBC has improved. Hematology was also consulted and they do suspect that it is likely reactive.  Peripheral smear without concerning features.  Pathologist review of the smear low concern for leukemia.   This may need further management in the outpatient setting.   Acute urinary retention, recurrent After 2 attempts of in and out catheterization last night, eventually had Foley catheter placed 7/1.  Foley catheter discontinued on 7/3.  But developed recurrent urinary retention and Foley catheter placed back 7/5. Persistent urinary retention most likely due to the fact that she has been nonambulatory for such a long period of time.   Would recommend that the Foley catheter be kept in until patient is able to get up and ambulate with physical therapy.  Acute metabolic encephalopathy:  Likely multifactorial related to pain, pain medicines, hospitalization in a very elderly female patient who may have some underlying cognitive impairment.  Supportive care, minimize opioids, redirect.   Body mass index is 29.1 kg/m.  Severe protein calorie malnutrition Nutrition Problem: Severe Malnutrition Etiology: chronic illness (CKD) Signs/Symptoms: severe fat depletion, severe muscle depletion Interventions: Refer to RD note for  recommendations   Adult failure to thrive: Multifactorial due to advanced age and multiple severe acute and chronic comorbidities as above.  Discussed in detail with son and updated current status and how ill she is.  Palliative care medicine consulted for goals of care.  Could transition to hospice depending on how patient does over the next several days to weeks.  DVT prophylaxis: Eliquis Code Status: DNR Family Communication: Discussed with patient's son yesterday.  Plan is for her to go to skilled nursing facility in [place].  Initially plan was for transport by ambulance.  Now the plan is for her to go by air ambulance.  Due to bad weather this has been postponed to tomorrow. Disposition: Status is: Inpatient  Remains inpatient appropriate because:IV treatments appropriate due to intensity of illness or inability to take PO   Dispo:  Patient From: Home  Planned Disposition: [place]  Expected discharge date: 04/03/20  Medically stable for discharge: No         Consultants:   Orthopedic Hematology Nephrology Cardiology Palliative  Procedures:   S/p right hip fracture IM nail 6/28 Foley catheter 7/1-7/3, 7/5 greater than  Antimicrobials:   None   Subjective:  Patient complains of some pain in the right hip area.  No difficulty breathing.  No chest pain.  No nausea vomiting.  Objective:   Vitals:   03/31/20 1545 04/01/20 0500 04/01/20 0701 04/01/20 0815  BP: 123/65  (!) 116/59  117/64  Pulse: 67  70 75  Resp: 16  16 18   Temp: 98.4 F (36.9 C)  (!) 97.5 F (36.4 C) (!) 97.5 F (36.4 C)  TempSrc: Oral  Oral Oral  SpO2:   99% 99%  Weight:  67.6 kg    Height:        General appearance: Awake alert.  In no distress.  Mildly distracted. Resp: Mildly tachypneic.  Crackles at the bases.  No wheezing or rhonchi.   Cardio: S1-S2 is normal regular.  No C5-E5.   systolic murmur appreciated over the aortic area GI: Abdomen is soft.  Nontender  nondistended.  Bowel sounds are present normal.  No masses organomegaly Extremities: No edema.  Bruising noted over the right lateral thigh which is stable per previous notes. Neurologic: No focal neurological deficits.      Data Reviewed:     CBC: Recent Labs  Lab 03/30/20 0005 03/31/20 0639 04/01/20 0540  WBC 28.2* 18.2* 13.6*  HGB 9.0* 8.6* 9.0*  HCT 29.5* 28.3* 28.9*  MCV 100.0 101.1* 98.3  PLT 384 312 277    Basic Metabolic Panel: Recent Labs  Lab 03/30/20 0005 03/31/20 0639 04/01/20 0540  NA 137 139 138  K 4.2 4.1 4.0  CL 104 104 102  CO2 21* 25 26  GLUCOSE 129* 100* 85  BUN 67* 69* 66*  CREATININE 1.42* 1.24* 1.21*  CALCIUM 8.1* 8.3* 8.1*  MG  --  2.1  --     Liver Function Tests: Recent Labs  Lab 03/29/20 0454 04/01/20 0540  ALBUMIN 2.5* 3.3*     Microbiology Studies:   Recent Results (from the past 240 hour(s))  SARS Coronavirus 2 by RT PCR (hospital order, performed in [place] hospital lab) Nasopharyngeal Nasopharyngeal Swab     Status: None   Collection Time: 03/31/20  8:40 PM   Specimen: Nasopharyngeal Swab  Result Value Ref Range Status   SARS Coronavirus 2 NEGATIVE NEGATIVE Final    Comment: (NOTE) SARS-CoV-2 target nucleic acids are NOT DETECTED.  The SARS-CoV-2 RNA is generally detectable in upper and lower respiratory specimens during the acute phase of infection. The lowest concentration of SARS-CoV-2 viral copies this assay can detect is 250 copies / mL. A negative result does not preclude SARS-CoV-2 infection and should not be used as the sole basis for treatment or other patient management decisions.  A negative result may occur with improper specimen collection / handling, submission of specimen other than nasopharyngeal swab, presence of viral mutation(s) within the areas targeted by this assay, and inadequate number of viral copies (<250 copies / mL). A negative result must be combined with clinical observations,  patient history, and epidemiological information.  Fact Sheet for Patients:   StrictlyIdeas.no  Fact Sheet for Healthcare Providers: BankingDealers.co.za  This test is not yet approved or  cleared by the [place] FDA and has been authorized for detection and/or diagnosis of SARS-CoV-2 by FDA under an Emergency Use Authorization (EUA).  This EUA will remain in effect (meaning this test can be used) for the duration of the COVID-19 declaration under Section 564(b)(1) of the Act, 21 U.S.C. section 360bbb-3(b)(1), unless the authorization is terminated or revoked sooner.  Performed at [facility], [address] [place], [place] [address]      Radiology Studies:  [facility] Chest [place] 1 View  Result Date: 03/31/2020 CLINICAL DATA:  86 year old female with hypoxia. EXAM: PORTABLE CHEST 1 VIEW COMPARISON:  Chest radiograph dated 03/28/2020. FINDINGS: There is  cardiomegaly with vascular congestion and mild edema. Small bilateral pleural effusions, right greater left with associated bibasilar atelectasis. Pneumonia is not excluded clinical correlation is recommended. Overall similar or slight worsening in the degree of congestion compared to the prior radiograph. There is no pneumothorax. Atherosclerotic calcification of the aorta. Median sternotomy wires. No acute osseous pathology. IMPRESSION: Cardiomegaly with findings of CHF and small bilateral pleural effusions. Pneumonia is not excluded. Electronically Signed   By: Anner Crete M.D.   On: 03/31/2020 15:31     Scheduled Meds:   . apixaban  2.5 mg Oral BID  . calcium acetate  667 mg Oral BID WC  . Chlorhexidine Gluconate Cloth  6 each Topical Daily  . docusate sodium  100 mg Oral BID  . feeding supplement (ENSURE ENLIVE)  237 mL Oral TID BM  . levothyroxine  75 mcg Oral Q0600  . metoprolol succinate  25 mg Oral Daily  . multivitamin with minerals  1 tablet Oral Daily  .  polyethylene glycol  17 g Oral BID  . senna-docusate  2 tablet Oral Daily  . torsemide  10 mg Oral Daily    Continuous Infusions:   . albumin human 12.5 g (04/01/20 1034)  . methocarbamol (ROBAXIN) IV       LOS: 11 days     [place] [facility]    To contact the attending provider between 7A-7P or the covering provider during after hours 7P-7A, please log into the web site [email or web address] and access using universal [place] password for that web site. If you do not have the password, please call the hospital operator.  04/01/2020, 11:06 AM

## 2020-04-01 NOTE — Progress Notes (Signed)
Progress Note  Patient Name: Rebecca Cruz Date of Encounter: 04/01/2020  [facility] Cardiologist: Followed in [place]  Subjective   Patient relatively lethargic, overall no complaints, some leg discomfort Family at the bedside, lots of questions Leaving for [place] tomorrow They are leaning towards comfort, less aggressive management of her aortic valve stenosis, pulmonary hypertension, heart failure -She continues to have episodic shortness of breath per the notes but stable, on room air  Inpatient Medications    Scheduled Meds:  apixaban  2.5 mg Oral BID   calcium acetate  667 mg Oral BID WC   Chlorhexidine Gluconate Cloth  6 each Topical Daily   docusate sodium  100 mg Oral BID   feeding supplement (ENSURE ENLIVE)  237 mL Oral TID BM   levothyroxine  75 mcg Oral Q0600   metoprolol succinate  25 mg Oral Daily   multivitamin with minerals  1 tablet Oral Daily   polyethylene glycol  17 g Oral BID   senna-docusate  2 tablet Oral Daily   torsemide  10 mg Oral Daily   Continuous Infusions:  albumin human 12.5 g (04/01/20 1034)   methocarbamol (ROBAXIN) IV     PRN Meds: acetaminophen, bisacodyl, magic mouthwash, methocarbamol **OR** methocarbamol (ROBAXIN) IV, ondansetron **OR** ondansetron (ZOFRAN) IV, sodium phosphate, traMADol   Vital Signs    Vitals:   04/01/20 0500 04/01/20 0701 04/01/20 0815 04/01/20 1529  BP:  (!) 116/59 117/64 113/71  Pulse:  70 75 82  Resp:  16 18 18   Temp:  (!) 97.5 F (36.4 C) (!) 97.5 F (36.4 C) 97.9 F (36.6 C)  TempSrc:  Oral Oral Oral  SpO2:  99% 99% 98%  Weight: 67.6 kg     Height:        Intake/Output Summary (Last 24 hours) at 04/01/2020 1647 Last data filed at 04/01/2020 1036 Gross per 24 hour  Intake 240 ml  Output 1575 ml  Net -1335 ml   Last 3 Weights 04/01/2020 03/21/2020  Weight (lbs) 149 lb 113 lb 9.6 oz  Weight (kg) 67.586 kg 51.529 kg      Telemetry   Atrial fibrillation rate 70s-  Personally Reviewed  ECG    - Personally Reviewed  Physical Exam   GEN: No acute distress.   Neck: JVD unable to estimate Cardiac:  Irregularly irregular, no murmurs, rubs, or gallops.  Respiratory: Clear to auscultation bilaterally. GI: Soft, nontender, non-distended  MS: No edema; No deformity.,  Full exam not performed Neuro:  Nonfocal  Psych: Normal affect   Labs    High Sensitivity Troponin:   Recent Labs  Lab 03/29/20 1535 03/29/20 1750 03/29/20 2205 03/30/20 0005  TROPONINIHS 458* 517* 506* 479*      Chemistry Recent Labs  Lab 03/29/20 0454 03/29/20 0454 03/30/20 0005 03/31/20 0639 04/01/20 0540  NA 138   < > 137 139 138  K 5.0   < > 4.2 4.1 4.0  CL 108   < > 104 104 102  CO2 24   < > 21* 25 26  GLUCOSE 101*   < > 129* 100* 85  BUN 75*   < > 67* 69* 66*  CREATININE 1.50*   < > 1.42* 1.24* 1.21*  CALCIUM 7.9*   < > 8.1* 8.3* 8.1*  ALBUMIN 2.5*  --   --   --  3.3*  GFRNONAA 30*   < > 32* 37* 38*  GFRAA 34*   < > 37* 43* 44*  ANIONGAP 6   < >  12 10 10    < > = values in this interval not displayed.     Hematology Recent Labs  Lab 03/30/20 0005 03/31/20 0639 04/01/20 0540  WBC 28.2* 18.2* 13.6*  RBC 2.95* 2.80* 2.94*  HGB 9.0* 8.6* 9.0*  HCT 29.5* 28.3* 28.9*  MCV 100.0 101.1* 98.3  MCH 30.5 30.7 30.6  MCHC 30.5 30.4 31.1  RDW 19.0* 19.3* 19.5*  PLT 384 312 279    BNP Recent Labs  Lab 03/29/20 0454  BNP 1,611.0*     DDimer No results for input(s): DDIMER in the last 168 hours.   Radiology    DG Chest Port 1 View  Result Date: 03/31/2020 CLINICAL DATA:  86 year old female with hypoxia. EXAM: PORTABLE CHEST 1 VIEW COMPARISON:  Chest radiograph dated 03/28/2020. FINDINGS: There is cardiomegaly with vascular congestion and mild edema. Small bilateral pleural effusions, right greater left with associated bibasilar atelectasis. Pneumonia is not excluded clinical correlation is recommended. Overall similar or slight worsening in the degree  of congestion compared to the prior radiograph. There is no pneumothorax. Atherosclerotic calcification of the aorta. Median sternotomy wires. No acute osseous pathology. IMPRESSION: Cardiomegaly with findings of CHF and small bilateral pleural effusions. Pneumonia is not excluded. Electronically Signed   By: Anner Crete M.D.   On: 03/31/2020 15:31    Cardiac Studies   Echocardiogram March 30, 2020 In brief 1. Left ventricular ejection fraction, by estimation, is 70 to 75%. The  left ventricle has normal function. The left ventricle has no regional  wall motion abnormalities. There is mild left ventricular hypertrophy.  Left ventricular diastolic parameters  were normal.  2. Right ventricular systolic function is normal. The right ventricular  size is mildly enlarged. There is severely elevated pulmonary artery  systolic pressure.  3. Right atrial size was mildly dilated.  4. The mitral valve is grossly normal. Mild mitral valve regurgitation.  5. Tricuspid valve regurgitation is moderate.  6. The aortic valve was not well visualized. Aortic valve regurgitation  is trivial. Severe aortic valve stenosis.  V Area (Vmax):  0.47 cm   PV Vmax:    0.85 m/s  AV Area (Vmean):  0.47 cm   PV Peak grad:  2.9 mmHg  AV Area (VTI):   0.46 cm   RVOT Peak grad: 3 mmHg  AV Vmax:      451.33 cm/s  AV Vmean:     341.000 cm/s  AV VTI:      1.001 m  AV Peak Grad:   81.5 mmHg  AV Mean Grad:   52.0 mmHg    Patient Profile     86 y.o. female with history of permanent A. fib, hypertension, Severe AS status post AVR in the past, heart failure preserved EF being seen due to A. Fib and Severe AS of bioprosthetic valve.  Hip fracture status post repair 6/28  Assessment & Plan    Severe aortic valve stenosis Mean gradient greater than 50 Likely contributing to heart failure symptoms, pulmonary hypertension, shortness of breath episodes -Long discussion with  family, treatment options limited -Family leaning towards focusing attention on recovery, working on nutrition, anemia, strength after recent surgery -At a later date if she regains her normal status, could consider consultation with cardiology -Uncertain if she would be a candidate for TAVR given prior bioprosthetic valve -Not sure if valvuloplasty would help and would come with high risk --- For now would treat with torsemide 10 mg daily, extra torsemide 10 mg in the p.m.  for weight gain 3 pounds, shortness of breath symptoms, orthopnea PND  --- Shortness of breath -Likely multifactorial, including atrial fibrillation, severe aortic valve stenosis, pulmonary hypertension, anemia -Stressed importance of working on nutrition, this will help anemia, continue torsemide 10 mg daily possibly up to 10 mg twice daily/extra dose as needed in the afternoon for worsening shortness of breath or weight gain  Hip fracture, status post surgery Spent 4 hours in the recliner today, slowly improving  Permanent atrial fibrillation Rate well controlled, on metoprolol and Eliquis Would continue to monitor hemoglobin with periodic lab work, so far hemoglobin stable past several days  Blood loss anemia Hemoglobin from 8 range now up to 9, stable Likely contributing to some of the shortness of breath Goal should be 10 or higher  Long discussion with family concerning treatment options for aortic valve, discussed CHF, pulmonary hypertension, recovery  Total encounter time more than 35 minutes  Greater than 50% was spent in counseling and coordination of care with the patient    For questions or updates, please contact [place] Please consult [email or web address] for contact info under        Signed, Ida Rogue, MD  04/01/2020, 4:47 PM

## 2020-04-01 NOTE — Progress Notes (Signed)
Nutrition Brief Follow-Up Note  Noted diet has now been changed to dysphagia 2 with nectar-thick liquids. Will discontinue Ensure Enlive as it is not nectar-thick and provide appropriate oral nutrition supplements. Per review of chart plan is for patient to discharge tomorrow morning.  Nutrition Intervention: -Provide Hormel Shake po BID with meals, each supplement provides 520 kcal and 20 grams of protein. -Continue Magic cup BID with meals, each supplement provides 290 kcal and 9 grams of protein. -Continue daily MVI.  Jacklynn Barnacle, MS, RD, LDN Pager number available on Amion

## 2020-04-01 NOTE — Progress Notes (Signed)
Physical Therapy Treatment Patient Details Name: Rebecca Cruz MRN: [id number] DOB: 1923/10/18 Today's Date: 04/01/2020    History of Present Illness Pt is a 86 y.o. female presenting to hospital 6/27 s/p fall (tripped and fell last night) sustaining R hip pain.  Imaging showing: "Displaced comminuted fracture through the right femoral intertrochanteric region. A nondisplaced fracture through the right inferior pubic ramus is not excluded."  Per MD Posey Pronto note (ortho): "We will additionally plan to treat possible right inferior pubic ramus fracture non-operatively as there are no other signs of significant pelvic ring injury."  Pt s/p IM nailing of R femur with cephalomedullary device 6/28 secondary R intertrochanteric hip fx.  Significant ecchymosis noted post-op; medical staff aware and monitoring. PMH includes htn, thyroid disease, aortic valve repair, appendectomy, abdominal hysterectomy, stage IV CKD, a-fib.    PT Comments    Pt was long sitting in bed upon arriving. She is alert but oriented only x 2. She was able to consistently follow commands throughout. Pt agreeable to OOB activity and sitting up in recliner. She required Mod assist to exit R side of bed. Sat EOB x several minutes prior to standing to RW with mod assist from elevated surface. Took steps to recliner. Has BM during ambulation to recliner. Pt unaware she was having movement. Therapist assisted with hygiene/clean up. RN aware. She was repositioned in recliner with chair alarm in place and call bell in reach at conclusion of session. She will benefit from skilled PT at SNF to address strength, balance, and safe functional mobility deficits.      Follow Up Recommendations  SNF     Equipment Recommendations  Rolling walker with 5" wheels;3in1 (PT)    Recommendations for Other Services       Precautions / Restrictions Precautions Precautions: Fall Precaution Comments: Progress activity slowly per cardiology; amb bed to  chair ok Restrictions Weight Bearing Restrictions: Yes RLE Weight Bearing: Weight bearing as tolerated    Mobility  Bed Mobility Overal bed mobility: Needs Assistance Bed Mobility: Supine to Sit     Supine to sit: Mod assist     General bed mobility comments: MOD assist to safely progress from supine (HOB slightly elevated) to short sitting EOB. Vcs and tactile cues throughout with increased time to perform. No report of dizziness/lightheadedness upon sitting up.   Transfers Overall transfer level: Needs assistance Equipment used: Rolling walker (2 wheeled) Transfers: Sit to/from Stand Sit to Stand: Mod assist;From elevated surface         General transfer comment: MOD assist to stand from slightly elevated bed height to RW. Tactile/verbal cues for safety and technique  Ambulation/Gait Ambulation/Gait assistance: Mod assist Gait Distance (Feet): 3 Feet Assistive device: Rolling walker (2 wheeled) Gait Pattern/deviations: Step-to pattern;Antalgic;Decreased step length - right;Decreased step length - left;Decreased stance time - right;Decreased stance time - left;Shuffle Gait velocity: decreased   General Gait Details: Pt has BM during ambulation 3 ft from EOB to recliner. Constant vsc for posture correction and gait sequencing.   Stairs             Wheelchair Mobility    Modified Rankin (Stroke Patients Only)       Balance Overall balance assessment: Needs assistance Sitting-balance support: Bilateral upper extremity supported;Feet supported Sitting balance-Leahy Scale: Fair Sitting balance - Comments: pt was able to sit EOB x ~ 2 minutes without LOB. close SBA/CGA for safety   Standing balance support: Bilateral upper extremity supported;During functional activity Standing balance-Leahy Scale: Poor Standing balance  comment: heavy reliance on RW during standing for safety. High fall risk                            Cognition Arousal/Alertness:  Awake/alert Behavior During Therapy: WFL for tasks assessed/performed Overall Cognitive Status: History of cognitive impairments - at baseline                                 General Comments: Pt was A and O x 2 but able to follow commands throughout consistently.      Exercises      General Comments        Pertinent Vitals/Pain Pain Assessment:  (did not rate) Pain Location: RLE Pain Descriptors / Indicators: Guarding;Sore Pain Intervention(s): Limited activity within patient's tolerance;Monitored during session;Premedicated before session;Repositioned    Home Living                      Prior Function            PT Goals (current goals can now be found in the care plan section) Acute Rehab PT Goals Patient Stated Goal: "I want to be able to do for myself" Progress towards PT goals: Progressing toward goals    Frequency    BID      PT Plan Current plan remains appropriate    Co-evaluation              AM-PAC PT "6 Clicks" Mobility   Outcome Measure  Help needed turning from your back to your side while in a flat bed without using bedrails?: A Lot Help needed moving from lying on your back to sitting on the side of a flat bed without using bedrails?: A Lot Help needed moving to and from a bed to a chair (including a wheelchair)?: A Lot Help needed standing up from a chair using your arms (e.g., wheelchair or bedside chair)?: A Lot Help needed to walk in hospital room?: A Lot Help needed climbing 3-5 steps with a railing? : Total 6 Click Score: 11    End of Session Equipment Utilized During Treatment: Gait belt Activity Tolerance: Patient tolerated treatment well Patient left: in chair;with call bell/phone within reach;with chair alarm set;with SCD's reapplied Nurse Communication: Mobility status PT Visit Diagnosis: Other abnormalities of gait and mobility (R26.89);Muscle weakness (generalized) (M62.81);History of falling  (Z91.81);Difficulty in walking, not elsewhere classified (R26.2);Pain Pain - Right/Left: Right Pain - part of body: Hip;Leg     Time: 1000-1024 PT Time Calculation (min) (ACUTE ONLY): 24 min  Charges:  $Gait Training: 8-22 mins $Therapeutic Activity: 8-22 mins                     Julaine Fusi PTA 04/01/20, 3:59 PM

## 2020-04-01 NOTE — TOC Progression Note (Signed)
Transition of Care [facility]) - Progression Note    Patient Details  Name: Rebecca Cruz MRN: [id number] Date of Birth: [birth year]  [facility] CM/SW Contact  Su Hilt, RN Phone Number: 04/01/2020, 11:03 AM  Clinical Narrative:     Spoke with the patient's son Rebecca Cruz, they transport company will be here at 33 AM on Friday to transport, I notified the physician       Expected Discharge Plan and Services                                                 Social Determinants of Health ([place]) Interventions    Readmission Risk Interventions No flowsheet data found.

## 2020-04-01 NOTE — Progress Notes (Signed)
Physical Therapy Treatment Patient Details Name: Rebecca Cruz MRN: [id number] DOB: October 07, 1923 Today's Date: 04/01/2020    History of Present Illness Pt is a 86 y.o. female presenting to hospital 6/27 s/p fall (tripped and fell last night) sustaining R hip pain.  Imaging showing: "Displaced comminuted fracture through the right femoral intertrochanteric region. A nondisplaced fracture through the right inferior pubic ramus is not excluded."  Per MD Posey Pronto note (ortho): "We will additionally plan to treat possible right inferior pubic ramus fracture non-operatively as there are no other signs of significant pelvic ring injury."  Pt s/p IM nailing of R femur with cephalomedullary device 6/28 secondary R intertrochanteric hip fx.  Significant ecchymosis noted post-op; medical staff aware and monitoring. PMH includes htn, thyroid disease, aortic valve repair, appendectomy, abdominal hysterectomy, stage IV CKD, a-fib.    PT Comments    Pt was seated in recliner upon arriving with son and daughter in-law present. She request to get back into bed. Pt is Alert but slightly confused. Was able to follow commands consistently throughout. She stood form recliner to RW with mod assist. Took 3 steps to EOB with poor step quality. Had another BM during standing/gait training. Assisted with clean up prior to pt being repositioned in bed. Overall pt tolerated sessions well today. She will continue to benefit from skilled PT at [facility] to address deficits with strength and safe functional mobility. At conclusion of session, pt was in bed with bed alarm in place, call bell in reach, and RN aware of pt's abilities.       Follow Up Recommendations  SNF     Equipment Recommendations  Rolling walker with 5" wheels;3in1 (PT)    Recommendations for Other Services       Precautions / Restrictions Precautions Precautions: Fall Precaution Comments: Progress activity slowly per cardiology; amb bed to chair  ok Restrictions Weight Bearing Restrictions: Yes RLE Weight Bearing: Weight bearing as tolerated    Mobility  Bed Mobility Overal bed mobility: Needs Assistance Bed Mobility: Sit to Supine     Supine to sit: Mod assist Sit to supine: Max assist   General bed mobility comments: Required increased assistance to return to supine from EOB . Max assist progressing LEs into bed  Transfers Overall transfer level: Needs assistance Equipment used: Rolling walker (2 wheeled) Transfers: Sit to/from Stand Sit to Stand: Mod assist         General transfer comment: MOd assist to STS from recliner with vcs for technique, sequencing and safety. INcreased time to perform   Ambulation/Gait Ambulation/Gait assistance: Mod assist Gait Distance (Feet): 3 Feet Assistive device: Rolling walker (2 wheeled) Gait Pattern/deviations: Step-to pattern;Decreased step length - left;Decreased stance time - right;Antalgic Gait velocity: decreased   General Gait Details: Pt was able to take 3 steps to EOB from recliner with increased time.  Has another BM iduring standing activity.   Stairs             Wheelchair Mobility    Modified Rankin (Stroke Patients Only)       Balance Overall balance assessment: Needs assistance Sitting-balance support: Bilateral upper extremity supported;Feet supported Sitting balance-Leahy Scale: Fair Sitting balance - Comments: pt was able to sit EOB x ~ 2 minutes without LOB. close SBA/CGA for safety   Standing balance support: Bilateral upper extremity supported;During functional activity Standing balance-Leahy Scale: Poor Standing balance comment: heavy reliance on RW during standing for safety. High fall risk  Cognition Arousal/Alertness: Awake/alert Behavior During Therapy: WFL for tasks assessed/performed Overall Cognitive Status: History of cognitive impairments - at baseline                                  General Comments: Pt was seated in recliner from AM session. Son/daughter-in law present. She is able to follow commands throughout.      Exercises      General Comments        Pertinent Vitals/Pain Pain Assessment:  (did not rate) Pain Score: 3  Faces Pain Scale: Hurts a little bit Pain Location: RLE Pain Descriptors / Indicators: Guarding;Sore Pain Intervention(s): Limited activity within patient's tolerance;Monitored during session;Premedicated before session;Repositioned    Home Living                      Prior Function            PT Goals (current goals can now be found in the care plan section) Acute Rehab PT Goals Patient Stated Goal: To be able to move better Progress towards PT goals: Progressing toward goals    Frequency    BID      PT Plan Current plan remains appropriate    Co-evaluation              AM-PAC PT "6 Clicks" Mobility   Outcome Measure  Help needed turning from your back to your side while in a flat bed without using bedrails?: A Lot Help needed moving from lying on your back to sitting on the side of a flat bed without using bedrails?: A Lot Help needed moving to and from a bed to a chair (including a wheelchair)?: A Lot Help needed standing up from a chair using your arms (e.g., wheelchair or bedside chair)?: A Lot Help needed to walk in hospital room?: A Lot Help needed climbing 3-5 steps with a railing? : Total 6 Click Score: 11    End of Session Equipment Utilized During Treatment: Gait belt Activity Tolerance: Patient tolerated treatment well Patient left: in bed;with call bell/phone within reach;with bed alarm set;with nursing/sitter in room;with SCD's reapplied;with family/visitor present Nurse Communication: Mobility status PT Visit Diagnosis: Other abnormalities of gait and mobility (R26.89);Muscle weakness (generalized) (M62.81);History of falling (Z91.81);Difficulty in walking, not elsewhere  classified (R26.2);Pain Pain - Right/Left: Right Pain - part of body: Hip;Leg     Time: 1000-1024 PT Time Calculation (min) (ACUTE ONLY): 24 min  Charges:  $Gait Training: 8-22 mins $Therapeutic Activity: 8-22 mins                     Julaine Fusi PTA 04/01/20, 4:21 PM

## 2020-04-01 NOTE — Progress Notes (Signed)
[address] [place], [place] [address] Phone [phone]. Fax [phone]  Date: 04/01/2020                  Patient Name:  Rebecca Cruz  MRN: [id number]  DOB: 1924-03-11  Age / Sex: 86 y.o., female         PCP: Patient, No Pcp Per                 Service Requesting Consult: IM/ Bonnielee Haff, MD                 Reason for Consult: ARF            History of Present Illness: Patient is a 86 y.o. female with medical problems of hypertension, who was admitted to [facility] on 03/21/2020 for evaluation of right hip pain after fall.  She was diagnosed with a right hip fracture and underwent intramedullary nailing of right femur on June 28. Nephrology consult has been requested for rising creatinine as well as fluid overload  Patient has responded well over the last 3 to 4 days to IV albumin. Later low-dose torsemide was added. Urine output has maintained between 1600 to 2000 cc Serum creatinine has improved to 1.2 and remained stable Patient is able to eat some without nausea or vomiting Sitting up in the recliner chair this morning    Current medications: Current Facility-Administered Medications  Medication Dose Route Frequency Provider Last Rate Last Admin  . acetaminophen (TYLENOL) tablet 650 mg  650 mg Oral Q6H PRN Modena Jansky, MD   650 mg at 04/01/20 1029  . albumin human 25 % solution 12.5 g  12.5 g Intravenous BID Murlean Iba, MD 60 mL/hr at 04/01/20 1034 12.5 g at 04/01/20 1034  . apixaban (ELIQUIS) tablet 2.5 mg  2.5 mg Oral BID Vernell Leep D, MD   2.5 mg at 04/01/20 1030  . bisacodyl (DULCOLAX) suppository 10 mg  10 mg Rectal Daily PRN Leim Fabry, MD   10 mg at 03/25/20 1000  . calcium acetate (PHOSLO) capsule 667 mg  667 mg Oral BID WC Leim Fabry, MD   667 mg at 04/01/20 1030  . Chlorhexidine Gluconate Cloth 2 % PADS 6 each  6 each Topical Daily Enzo Bi, MD   6 each at 03/31/20 1026  . docusate sodium (COLACE) capsule 100 mg  100 mg  Oral BID Leim Fabry, MD   100 mg at 04/01/20 1030  . feeding supplement (ENSURE ENLIVE) (ENSURE ENLIVE) liquid 237 mL  237 mL Oral TID BM Bonnielee Haff, MD   237 mL at 04/01/20 1036  . levothyroxine (SYNTHROID) tablet 75 mcg  75 mcg Oral Q0600 Leim Fabry, MD   75 mcg at 04/01/20 0700  . methocarbamol (ROBAXIN) tablet 500 mg  500 mg Oral Q6H PRN Leim Fabry, MD   500 mg at 04/01/20 0012   Or  . methocarbamol (ROBAXIN) 500 mg in dextrose 5 % 50 mL IVPB  500 mg Intravenous Q6H PRN Leim Fabry, MD      . metoprolol succinate (TOPROL-XL) 24 hr tablet 25 mg  25 mg Oral Daily Modena Jansky, MD   25 mg at 04/01/20 1029  . multivitamin with minerals tablet 1 tablet  1 tablet Oral Daily Bonnielee Haff, MD   1 tablet at 04/01/20 1030  . ondansetron (ZOFRAN) tablet 4 mg  4 mg Oral Q6H PRN Leim Fabry, MD   4 mg at 03/31/20 1821   Or  .  ondansetron (ZOFRAN) injection 4 mg  4 mg Intravenous Q6H PRN Leim Fabry, MD   4 mg at 03/27/20 2111  . polyethylene glycol (MIRALAX / GLYCOLAX) packet 17 g  17 g Oral BID Modena Jansky, MD   17 g at 03/30/20 2132  . senna-docusate (Senokot-S) tablet 2 tablet  2 tablet Oral Daily Modena Jansky, MD   2 tablet at 04/01/20 1029  . sodium phosphate (FLEET) 7-19 GM/118ML enema 1 enema  1 enema Rectal Once PRN Leim Fabry, MD      . torsemide [facility]) tablet 10 mg  10 mg Oral Daily Murlean Iba, MD   10 mg at 04/01/20 1029  . traMADol (ULTRAM) tablet 50 mg  50 mg Oral Q12H PRN Modena Jansky, MD   50 mg at 03/30/20 2304     Vital Signs: Blood pressure 117/64, pulse 75, temperature (!) 97.5 F (36.4 C), temperature source Oral, resp. rate 18, height 5' (1.524 m), weight 67.6 kg, SpO2 99 %.   Intake/Output Summary (Last 24 hours) at 04/01/2020 1302 Last data filed at 04/01/2020 1036 Gross per 24 hour  Intake 240 ml  Output 1575 ml  Net -1335 ml    Weight trends: [employer]   03/21/20 0808 04/01/20 0500  Weight: 51.5 kg 67.6 kg    Physical  Exam: General:  Frail, elderly woman  HEENT  moist oral mucous membranes  Neck:  No JVD  Lungs:  Normal breathing effort, [place] O2, mild fine crackles  Heart::  No rub  Abdomen:  Soft, nontender, nondistended  Extremities:  Some dependent edema on the thighs and arms,  Neurologic:  Alert, able to answer few simple questions  Skin:  Scattered ecchymosis,     Lab results: Basic Metabolic Panel: Recent Labs  Lab 03/30/20 0005 03/31/20 0639 04/01/20 0540  NA 137 139 138  K 4.2 4.1 4.0  CL 104 104 102  CO2 21* 25 26  GLUCOSE 129* 100* 85  BUN 67* 69* 66*  CREATININE 1.42* 1.24* 1.21*  CALCIUM 8.1* 8.3* 8.1*  MG  --  2.1  --     Liver Function Tests: Recent Labs  Lab 04/01/20 0540  ALBUMIN 3.3*   No results for input(s): LIPASE, AMYLASE in the last 168 hours. No results for input(s): AMMONIA in the last 168 hours.  CBC: Recent Labs  Lab 03/31/20 0639 04/01/20 0540  WBC 18.2* 13.6*  HGB 8.6* 9.0*  HCT 28.3* 28.9*  MCV 101.1* 98.3  PLT 312 279    Cardiac Enzymes: Recent Labs  Lab 03/27/20 0904  CKTOTAL 487*    BNP: Invalid input(s): POCBNP  CBG: No results for input(s): GLUCAP in the last 168 hours.  Microbiology: Recent Results (from the past 720 hour(s))  SARS Coronavirus 2 by RT PCR (hospital order, performed in [facility] hospital lab) Nasopharyngeal Nasopharyngeal Swab     Status: None   Collection Time: 03/21/20  8:06 AM   Specimen: Nasopharyngeal Swab  Result Value Ref Range Status   SARS Coronavirus 2 NEGATIVE NEGATIVE Final    Comment: (NOTE) SARS-CoV-2 target nucleic acids are NOT DETECTED.  The SARS-CoV-2 RNA is generally detectable in upper and lower respiratory specimens during the acute phase of infection. The lowest concentration of SARS-CoV-2 viral copies this assay can detect is 250 copies / mL. A negative result does not preclude SARS-CoV-2 infection and should not be used as the sole basis for treatment or other patient  management decisions.  A negative result may  occur with improper specimen collection / handling, submission of specimen other than nasopharyngeal swab, presence of viral mutation(s) within the areas targeted by this assay, and inadequate number of viral copies (<250 copies / mL). A negative result must be combined with clinical observations, patient history, and epidemiological information.  Fact Sheet for Patients:   StrictlyIdeas.no  Fact Sheet for Healthcare Providers: BankingDealers.co.za  This test is not yet approved or  cleared by the [place] FDA and has been authorized for detection and/or diagnosis of SARS-CoV-2 by FDA under an Emergency Use Authorization (EUA).  This EUA will remain in effect (meaning this test can be used) for the duration of the COVID-19 declaration under Section 564(b)(1) of the Act, 21 U.S.C. section 360bbb-3(b)(1), unless the authorization is terminated or revoked sooner.  Performed at [facility], [address], [place], [place] [address]   Surgical PCR screen     Status: None   Collection Time: 03/21/20 12:15 PM   Specimen: Nasal Mucosa; Nasal Swab  Result Value Ref Range Status   MRSA, PCR NEGATIVE NEGATIVE Final   Staphylococcus aureus NEGATIVE NEGATIVE Final    Comment: (NOTE) The Xpert SA Assay (FDA approved for NASAL specimens in patients 96 years of age and older), is one component of a comprehensive surveillance program. It is not intended to diagnose infection nor to guide or monitor treatment. Performed at [facility], [address] [place], [place] [address]   SARS Coronavirus 2 by RT PCR (hospital order, performed in [facility] hospital lab) Nasopharyngeal Nasopharyngeal Swab     Status: None   Collection Time: 03/31/20  8:40 PM   Specimen: Nasopharyngeal Swab  Result Value Ref Range Status   SARS Coronavirus 2 NEGATIVE NEGATIVE Final    Comment:  (NOTE) SARS-CoV-2 target nucleic acids are NOT DETECTED.  The SARS-CoV-2 RNA is generally detectable in upper and lower respiratory specimens during the acute phase of infection. The lowest concentration of SARS-CoV-2 viral copies this assay can detect is 250 copies / mL. A negative result does not preclude SARS-CoV-2 infection and should not be used as the sole basis for treatment or other patient management decisions.  A negative result may occur with improper specimen collection / handling, submission of specimen other than nasopharyngeal swab, presence of viral mutation(s) within the areas targeted by this assay, and inadequate number of viral copies (<250 copies / mL). A negative result must be combined with clinical observations, patient history, and epidemiological information.  Fact Sheet for Patients:   StrictlyIdeas.no  Fact Sheet for Healthcare Providers: BankingDealers.co.za  This test is not yet approved or  cleared by the [place] FDA and has been authorized for detection and/or diagnosis of SARS-CoV-2 by FDA under an Emergency Use Authorization (EUA).  This EUA will remain in effect (meaning this test can be used) for the duration of the COVID-19 declaration under Section 564(b)(1) of the Act, 21 U.S.C. section 360bbb-3(b)(1), unless the authorization is terminated or revoked sooner.  Performed at [facility], [address] [place], [place] [address]      Coagulation Studies: No results for input(s): LABPROT, INR in the last 72 hours.  Urinalysis: No results for input(s): COLORURINE, LABSPEC, PHURINE, GLUCOSEU, HGBUR, BILIRUBINUR, KETONESUR, PROTEINUR, UROBILINOGEN, NITRITE, LEUKOCYTESUR in the last 72 hours.  Invalid input(s): APPERANCEUR      Imaging: DG Chest Port 1 View  Result Date: 03/31/2020 CLINICAL DATA:  86 year old female with hypoxia. EXAM: PORTABLE CHEST 1 VIEW COMPARISON:  Chest  radiograph dated 03/28/2020. FINDINGS: There is cardiomegaly with vascular congestion and mild edema. Small bilateral pleural effusions, right greater left with associated bibasilar atelectasis. Pneumonia is not excluded clinical correlation is recommended. Overall similar or slight worsening in the degree of congestion compared to the prior radiograph. There is no pneumothorax. Atherosclerotic calcification of the aorta. Median sternotomy wires. No acute osseous pathology. IMPRESSION: Cardiomegaly with findings of CHF and small bilateral pleural effusions. Pneumonia is not excluded. Electronically Signed   By: Anner Crete M.D.   On: 03/31/2020 15:31     Assessment & Plan: Pt is a 86 y.o.   female with HTN, hypothyroidosm, A Fib, CKD, was admitted on 03/21/2020 with Fall [W19.XXXA] Closed fracture of right hip, initial encounter [facility]) [S72.001A] Intertrochanteric fracture of right femur, closed, initial encounter ([place]) [S72.141A]  #Acute kidney injury Baseline creatinine of 0.93 from March 22, 2020 Creatinine has been fluctuating and has been increasing for the past few days from 1.1 to peak of 1.5   Urinalysis from July 1 shows moderate blood with 11-20 RBCs, 11-20 WBCs with mild protein. Renal ultrasound from July 4 shows kidneys at lower limits of normal size, left smaller than right.  No hydronephrosis.  Slightly increased right renal echogenicity.  Small bilateral pleural effusions Acute kidney injury is likely secondary to volume shifts -Avoid nephrotoxins (eg aminoglycosides, IV contrast, nonsteroidals) -Avoid hypotension -Serum creatinine trend has started to improve with IV albumin supplementation  #Volume overload Likely third spacing of fluid due to low albumin (of 2.5->) likely Secondary to poor nutrition.  Boost or ensure supplements Continue to supplement IV albumin at low-dose of 12.5 g twice a day for the next 1-2 days more continue low-dose torsemide with iv albumin and  then evaluate. May need it only PRN Patient is expected to be discharged to facility close to her home in [place].    LOS: 11 Eboni Coval 7/8/20211:02 PM    Note: This note was prepared with Dragon dictation. Any transcription errors are unintentional

## 2020-04-01 NOTE — Care Management Important Message (Signed)
Important Message  Patient Details  Name: Rebecca Cruz MRN: [id number] Date of Birth: December 15, 1923   Medicare Important Message Given:  Yes     [name] A Brelee Renk 04/01/2020, 11:52 AM

## 2020-04-01 NOTE — Plan of Care (Signed)
  Problem: Education: Goal: Knowledge of General Education information will improve Description: Including pain rating scale, medication(s)/side effects and non-pharmacologic comfort measures Outcome: Progressing   Problem: Health Behavior/Discharge Planning: Goal: Ability to manage health-related needs will improve Outcome: Progressing   Problem: Clinical Measurements: Goal: Ability to maintain clinical measurements within normal limits will improve Outcome: Progressing Goal: Will remain free from infection Outcome: Progressing Goal: Diagnostic test results will improve Outcome: Progressing Goal: Respiratory complications will improve Outcome: Progressing Goal: Cardiovascular complication will be avoided Outcome: Progressing   Problem: Activity: Goal: Risk for activity intolerance will decrease Outcome: Progressing   Problem: Nutrition: Goal: Adequate nutrition will be maintained Outcome: Progressing   Problem: Coping: Goal: Level of anxiety will decrease Outcome: Progressing   Problem: Elimination: Goal: Will not experience complications related to bowel motility Outcome: Progressing Goal: Will not experience complications related to urinary retention Outcome: Progressing   Problem: Pain Managment: Goal: General experience of comfort will improve Outcome: Progressing   Problem: Safety: Goal: Ability to remain free from injury will improve Outcome: Progressing   Problem: Skin Integrity: Goal: Risk for impaired skin integrity will decrease Outcome: Progressing   Problem: Education: Goal: Knowledge of the prescribed therapeutic regimen will improve Outcome: Progressing Goal: Understanding of discharge needs will improve Outcome: Progressing Goal: Individualized Educational Video(s) Outcome: Progressing   Problem: Activity: Goal: Ability to avoid complications of mobility impairment will improve Outcome: Progressing Goal: Ability to tolerate increased  activity will improve Outcome: Progressing   Problem: Clinical Measurements: Goal: Postoperative complications will be avoided or minimized Outcome: Progressing   Problem: Pain Management: Goal: Pain level will decrease with appropriate interventions Outcome: Progressing   Problem: Skin Integrity: Goal: Will show signs of wound healing Outcome: Progressing   Problem: Education: Goal: Knowledge of the prescribed therapeutic regimen will improve Outcome: Progressing   Problem: Coping: Goal: Ability to identify and develop effective coping behavior will improve Outcome: Progressing   Problem: Clinical Measurements: Goal: Quality of life will improve Outcome: Progressing   Problem: Respiratory: Goal: Verbalizations of increased ease of respirations will increase Outcome: Progressing   Problem: Role Relationship: Goal: Family's ability to cope with current situation will improve Outcome: Progressing Goal: Ability to verbalize concerns, feelings, and thoughts to partner or family member will improve Outcome: Progressing   Problem: Pain Management: Goal: Satisfaction with pain management regimen will improve Outcome: Progressing   Problem: Education: Goal: Knowledge of disease and its progression will improve Outcome: Progressing   Problem: Fluid Volume: Goal: Compliance with measures to maintain balanced fluid volume will improve Outcome: Progressing   Problem: Health Behavior/Discharge Planning: Goal: Ability to manage health-related needs will improve Outcome: Progressing   Problem: Nutritional: Goal: Ability to make healthy dietary choices will improve Outcome: Progressing   Problem: Clinical Measurements: Goal: Complications related to the disease process, condition or treatment will be avoided or minimized Outcome: Progressing

## 2020-04-01 NOTE — Progress Notes (Signed)
Patient complained of being very uncomfortable and not being able to breathe. Vitals checked. O2 was 97 via N/C. NP Ouma notified. Morphine ordered and administered. NP came to visit patient. Patient then complained of abdominal pain. NP spoke with Dr. Posey Pronto regarding the compression wrap being too tight. It was then removed. Patient surgical dressing dry and intact.

## 2020-04-01 NOTE — TOC Progression Note (Signed)
Transition of Care [facility]) - Progression Note    Patient Details  Name: Rebecca Cruz MRN: [id number] Date of Birth: 1924-04-08  Transition of Care [facility]) CM/SW Contact  Su Hilt, RN Phone Number: 04/01/2020, 9:40 AM  Clinical Narrative:    Damaris Schooner with Eddie Dibbles the patient's son He stated that he will hear from the transport company if they will be able to fly today, it may be tomorrow, he is going to call me back to notify me        Expected Discharge Plan and Services                                                 Social Determinants of Health (SDOH) Interventions    Readmission Risk Interventions No flowsheet data found.

## 2020-04-02 LAB — BASIC METABOLIC PANEL WITH GFR
Anion gap: 10 (ref 5–15)
BUN: 62 mg/dL — ABNORMAL HIGH (ref 8–23)
CO2: 27 mmol/L (ref 22–32)
Calcium: 8.6 mg/dL — ABNORMAL LOW (ref 8.9–10.3)
Chloride: 103 mmol/L (ref 98–111)
Creatinine, Ser: 1.25 mg/dL — ABNORMAL HIGH (ref 0.44–1.00)
GFR calc Af Amer: 43 mL/min — ABNORMAL LOW
GFR calc non Af Amer: 37 mL/min — ABNORMAL LOW
Glucose, Bld: 113 mg/dL — ABNORMAL HIGH (ref 70–99)
Potassium: 3.9 mmol/L (ref 3.5–5.1)
Sodium: 140 mmol/L (ref 135–145)

## 2020-04-02 NOTE — Discharge Summary (Signed)
[facility]  Physician Discharge Summary   Patient ID: Rebecca Cruz MRN: [id number] DOB/AGE: [birth year] 86 y.o.  Admit date: 03/21/2020 Discharge date: 04/02/2020  PCP: Patient, No Pcp Per  DISCHARGE DIAGNOSES:  Right intertrochanteric fracture  Status post IM nail 0/01 Acute diastolic CHF Severe aortic stenosis Permanent atrial fibrillation Elevated troponin Acute kidney injury on chronic kidney disease stage IV Essential hypertension Hypothyroidism Postoperative acute blood loss anemia Leukocytosis Urinary retention requiring Foley catheter Acute metabolic encephalopathy Severe protein calorie malnutrition with failure to thrive  RECOMMENDATIONS FOR OUTPATIENT FOLLOW UP: 1. CBC and metabolic panel on Monday 2. Leave Foley catheter and do voiding trial when patient is able to ambulate 3. Consider outpatient referral to hematology for leukocytosis. 4. Consider follow-up with cardiology for her multiple cardiac issues.    Home Health: None Equipment/Devices: None  CODE STATUS: DNR  DISCHARGE CONDITION: Guarded  Diet recommendation: Dysphagia 2 diet with nectar thick liquids  INITIAL HISTORY: 86 year old female with PMH of hypertension, hypothyroidism, stage IV chronic kidney disease, atrial fibrillation on chronic anticoagulation, originally from [place], visiting family in the [place] area, presented to ED via EMS for evaluation of right hip pain.  S/p IM intertrochanteric nail on 6/28 for intertrochanteric fracture of right femur.  Hospital course complicated by ecchymosis of right medial thigh possibly related to surgical manipulation, some acute blood loss anemia, acute kidney injury, acute urinary retention, AMS and overall slow progress.  Hematology consulted for leukocytosis.  RLE ecchymosis, ABLA stabilized.  Creatinine fluctuating?  Element of cardiorenal syndrome.  Mild CHF/coughing-IV Lasix.  Urinary retention recurrent, Foley catheter placed  back.  AMS better but not resolved.  Nephrology consulted due to acute kidney injury and cardiology for acute CHF.  Consultations: Orthopedic Hematology Nephrology Cardiology Palliative  Procedures: S/p right hip fracture IM nail 6/28   HOSPITAL COURSE:   Intertrochanteric fracture of right femur, s/p IM nail 6/28.   Sustained a fall and had a displaced comminuted fracture.  Status post IM nail on 6/28.   Orthopedics has been following.  PT and OT evaluation.  Skilled nursing facility is recommended.   Hospital course complicated by extensive right medial thigh bruising extending from mons pubis/groin mostly up to the back of the right knee in a bandlike fashion, some of it tracking faintly to the right ankle.   Per orthopedics, this was possibly related to some manipulation of the medial aspect of the extremity during surgery which may have precipitated some bleeding.  Eliquis temporarily held for about 48 hours, resumed 7/3. Pain management-judicious use of opioids.   She has been stable from an orthopedic standpoint.  Acute diastolic CHF/Severe Aortic Stenosis Noted to be in fluid overload on 7/3.  She was given Lasix.   TTE in care everywhere on 10/04/2018: LVEF 70+%, mild LVH.  Possible LV relaxation abnormality.  Moderate to severe TR.  Pulmonary hypertension.  PFO/ASD with left-to-right shunting.   Echocardiogram was repeated during this hospital stay and shows preserved EF.  No evidence for diastolic dysfunction noted.  Significant aortic valve stenosis however was noted.  Cardiology has been following.  Lasix was changed over to torsemide.  She was also getting albumin per nephrology.  No further inpatient work-up recommended.  Chest x-ray was repeated which continues to show small bilateral pleural effusion with vascular congestion. Continue torsemide with close monitoring of renal function.  Possible nonsustained VT versus brief run of A. fib On the morning of 7/7 she had an  asymptomatic brief run of nonsustained ventricular  tachycardia.  Potassium is noted to be normal at 4.1.  Magnesium was 2.1.    Elevated troponin Troponin is noted to be minimally elevated.  Cardiology was following.  Echocardiogram does not show any wall motion abnormalities.  Mild LVH was noted.  Does not appear as if cardiology plans to do any further work-up. Outpatient cardiology follow up.   AKI on chronic kidney disease stage IV  Creatinine 0.9 on admission.  Since admission creatinine has been fluctuating up and down.  May be multifactorial due to fluctuating and poor oral intake, mild rhabdo and cardiorenal.  Briefly hydrated with IV fluids then stopped due to volume overload. Renal ultrasound without hydronephrosis. Subsequently nephrology was consulted.  Patient received IV albumin along with diuretics with good response.  Changed over to torsemide.  Renal function appears to be improving.     Permanent atrial fibrillation Continue reduced dose of Toprol-XL and Eliquis resumed after temporary hold for 48 hours.  No overt bleeding.  Essential hypertension Losartan held due to acute kidney injury.  Blood pressures have improved after cutting back Toprol-XL from 50 to 25 mg daily.  Hypothyroid Continue Synthroid.  Postop acute blood loss anemia Drop in hemoglobin likely due to surgical blood loss and bruising noted in the right lower extremity.  For the past several days hemoglobin has been stable.   Leukocytosis WBC 18 on presentation which progressively increased, 22.3 > 26.7 > 32.6.  Possible stress response.  No obvious source of infection is identified.  According to family patient has a history of elevated WBC.  WBC noted to be improving. Hematology was also consulted and they do suspect that it is likely reactive.  Peripheral smear without concerning features.  Pathologist review of the smear low concern for leukemia.   This may need further management in the outpatient  setting.   Acute urinary retention, recurrent After 2 attempts of in and out catheterization last night, eventually had Foley catheter placed 7/1.  Foley catheter discontinued on 7/3.  But developed recurrent urinary retention and Foley catheter placed back 7/5. Persistent urinary retention most likely due to the fact that she has been nonambulatory for such a long period of time.   Would recommend that the Foley catheter be kept in until patient is able to get up and ambulate with physical therapy.  Acute metabolic encephalopathy:  Likely multifactorial related to pain, pain medicines, hospitalization in a very elderly female patient who may have some underlying cognitive impairment.  Supportive care, minimize opioids, redirect.   Body mass index is 22.19 kg/m.  Severe protein calorie malnutrition Nutrition Problem: Severe Malnutrition Etiology: chronic illness (CKD) Signs/Symptoms: severe fat depletion, severe muscle depletion Interventions: Refer to RD note for recommendations   Adult failure to thrive: Multifactorial due to advanced age and multiple severe acute and chronic comorbidities as above.  Discussed in detail with son and updated current status and how ill she is.  Palliative care medicine consulted for goals of care.    Goals of care Palliative care has discussed with family.  The concern is that patient may decline further.  Patient has been waiting to achieve medical stability before going back to [place] where she will be going into a skilled nursing facility.  She lives in [place] and was here on a visit.  She has multiple family members in that area. The family's priority right now is to get her back to [place].  They would like to do that by air ambulance.  They understand  that patient may decompensate in route.  However we also understand the need for the patient to be closer to her family.  Risks and benefits explained.  They would still like to proceed with this  plan.   [place] for discharge to SNF.    PERTINENT LABS:  The results of significant diagnostics from this hospitalization (including imaging, microbiology, ancillary and laboratory) are listed below for reference.      Labs:    Basic Metabolic Panel: Recent Labs  Lab 03/29/20 0454 03/30/20 0005 03/31/20 0639 04/01/20 0540 04/02/20 0619  NA 138 137 139 138 140  K 5.0 4.2 4.1 4.0 3.9  CL 108 104 104 102 103  CO2 24 21* 25 26 27   GLUCOSE 101* 129* 100* 85 113*  BUN 75* 67* 69* 66* 62*  CREATININE 1.50* 1.42* 1.24* 1.21* 1.25*  CALCIUM 7.9* 8.1* 8.3* 8.1* 8.6*  MG  --   --  2.1  --   --    Liver Function Tests: Recent Labs  Lab 03/29/20 0454 04/01/20 0540  ALBUMIN 2.5* 3.3*   CBC: Recent Labs  Lab 03/28/20 0459 03/29/20 0454 03/30/20 0005 03/31/20 0639 04/01/20 0540  WBC 27.6* 29.2* 28.2* 18.2* 13.6*  HGB 8.4* 8.8* 9.0* 8.6* 9.0*  HCT 28.2* 28.4* 29.5* 28.3* 28.9*  MCV 102.5* 97.9 100.0 101.1* 98.3  PLT 360 376 384 312 279   Cardiac Enzymes: Recent Labs  Lab 03/27/20 0904  CKTOTAL 487*   BNP: BNP (last 3 results) Recent Labs    03/29/20 0454  BNP 1,611.0*     IMAGING STUDIES DG Chest 1 View  Result Date: 03/21/2020 CLINICAL DATA:  Preoperative study EXAM: CHEST  1 VIEW COMPARISON:  None. FINDINGS: Cardiomegaly. Diffuse interstitial opacities and mild Kerley B lines. No pneumothorax. No nodules or masses. No focal infiltrates. IMPRESSION: Cardiomegaly and mild edema. Electronically Signed   By: Dorise Bullion III M.D   On: 03/21/2020 09:09   DG Pelvis 1-2 Views  Result Date: 03/21/2020 CLINICAL DATA:  Pain after fall EXAM: PELVIS - 1-2 VIEW COMPARISON:  None. FINDINGS: A comminuted displaced fracture through the right femoral intertrochanteric region is identified. A nondisplaced left inferior pubic ramus fracture is not excluded. No other bony abnormalities in the pelvic bones. Vascular calcifications. IMPRESSION: 1. Displaced comminuted fracture  through the right femoral intertrochanteric region. A nondisplaced fracture through the right inferior pubic ramus is not excluded. Electronically Signed   By: Dorise Bullion III M.D   On: 03/21/2020 09:12   CT Head Wo Contrast  Result Date: 03/21/2020 CLINICAL DATA:  [facility]. Pt visiting from [place]. Uses cane/walked. States she lost her balance and fell on floor inside house. Swelling noted to R upper thigh. Internal rotation to R leg. Shortening as well. A&. TKV EXAM: CT HEAD WITHOUT CONTRAST TECHNIQUE: Contiguous axial images were obtained from the base of the skull through the vertex without intravenous contrast. COMPARISON:  None. FINDINGS: Brain: No evidence of acute infarction, hemorrhage, hydrocephalus, extra-axial collection or mass lesion/mass effect. There is ventricular sulcal enlargement reflecting mild diffuse atrophy. Patchy bilateral white matter hypoattenuation is also noted consistent with chronic microvascular ischemic change. Vascular: No hyperdense vessel or unexpected calcification. Skull: Normal. Negative for fracture or focal lesion. Sinuses/Orbits: Visualized globes and orbits are unremarkable. The visualized sinuses are clear. Other: There is a small amount of soft tissue air below the skull base, projecting adjacent to the right pterygoid plates, posterior to the right maxillary sinus and in  the infratemporal fossa. The etiology of this is unclear. IMPRESSION: 1. No acute intracranial abnormalities. 2. Mild atrophy and moderate chronic microvascular ischemic change. 3. Small amount of soft tissue air at the right skull base as detailed above. Consider a facial fracture, follow-up facial CT, if there is facial trauma. Electronically Signed   By: Lajean Manes M.D.   On: 03/21/2020 09:14   US RENAL  Result Date: 03/28/2020 CLINICAL DATA:  Acute renal insufficiency. EXAM: RENAL / URINARY TRACT ULTRASOUND COMPLETE COMPARISON:  None. FINDINGS: Right Kidney:  Renal measurements: 9.5 x 3.9 x 5.0 cm = volume: 97 mL. Slight increased cortical echogenicity. No mass or hydronephrosis visualized. Left Kidney: Renal measurements: 7.8 x 4.7 x 4.6 cm = volume: 89 mL. Echogenicity within normal limits. No mass or hydronephrosis visualized. Bladder: Appears normal for degree of bladder distention. Prevoid volume 413 mL. Other: Small bilateral pleural effusions. IMPRESSION: 1. Kidneys at the lower limits of normal in size left smaller than right. No evidence of hydronephrosis. Slight increased right renal cortical echogenicity which can be seen in medical renal disease. 2.  Small bilateral pleural effusions. Electronically Signed   By: Marin Olp M.D.   On: 03/28/2020 11:50   DG Chest Port 1 View  Result Date: 03/31/2020 CLINICAL DATA:  86 year old female with hypoxia. EXAM: PORTABLE CHEST 1 VIEW COMPARISON:  Chest radiograph dated 03/28/2020. FINDINGS: There is cardiomegaly with vascular congestion and mild edema. Small bilateral pleural effusions, right greater left with associated bibasilar atelectasis. Pneumonia is not excluded clinical correlation is recommended. Overall similar or slight worsening in the degree of congestion compared to the prior radiograph. There is no pneumothorax. Atherosclerotic calcification of the aorta. Median sternotomy wires. No acute osseous pathology. IMPRESSION: Cardiomegaly with findings of CHF and small bilateral pleural effusions. Pneumonia is not excluded. Electronically Signed   By: Anner Crete M.D.   On: 03/31/2020 15:31   DG Chest Port 1 View  Result Date: 03/28/2020 CLINICAL DATA:  Cough EXAM: PORTABLE CHEST 1 VIEW COMPARISON:  March 21, 2020 FINDINGS: The mediastinal contour and cardiac silhouette are stable. Cardiac valvular replacement ring is identified unchanged. Increased pulmonary interstitium is identified bilaterally with enlarged central pulmonary vessel caliber. There are small bilateral pleural effusions. The bony  structures are stable. IMPRESSION: Mild pulmonary edema. Small bilateral pleural effusions. Electronically Signed   By: Abelardo Diesel M.D.   On: 03/28/2020 09:46   DG Abd Portable 1V  Result Date: 03/25/2020 CLINICAL DATA:  Abdominal pain and distension EXAM: PORTABLE ABDOMEN - 1 VIEW COMPARISON:  None. FINDINGS: There is moderate stool throughout the colon. There is no bowel dilatation or air-fluid level to suggest bowel obstruction. No free air. There are scattered foci of arterial vascular calcification. There is postoperative change in the proximal right femur. Bones are osteoporotic. IMPRESSION: Moderate stool in colon.  No bowel obstruction or free air evident. Electronically Signed   By: Lowella Grip III M.D.   On: 03/25/2020 10:10   ECHOCARDIOGRAM COMPLETE  Result Date: 03/30/2020    ECHOCARDIOGRAM REPORT   Patient Name:   Rebecca Cruz Date of Exam: 03/30/2020 Medical Rec #:  [id number]          Height:       60.0 in Accession #:    [id number]         Weight:       113.6 lb Date of Birth:  Feb 13, 1924           BSA:  1.468 m Patient Age:    45 years           BP:           96/52 mmHg Patient Gender: F                  HR:           79 bpm. Exam Location:  [facility] Procedure: 2D Echo, Cardiac Doppler and Color Doppler Indications:     CHF-acute diastolic 676.19  History:         Patient has no prior history of Echocardiogram examinations.                  Risk Factors:Hypertension.  Sonographer:     Sherrie Sport RDCS (AE) Referring Phys:  [place] Diagnosing Phys: Bartholome Bill MD IMPRESSIONS  1. Left ventricular ejection fraction, by estimation, is 70 to 75%. The left ventricle has normal function. The left ventricle has no regional wall motion abnormalities. There is mild left ventricular hypertrophy. Left ventricular diastolic parameters were normal.  2. Right ventricular systolic function is normal. The right ventricular size is mildly enlarged. There is severely elevated  pulmonary artery systolic pressure.  3. Right atrial size was mildly dilated.  4. The mitral valve is grossly normal. Mild mitral valve regurgitation.  5. Tricuspid valve regurgitation is moderate.  6. The aortic valve was not well visualized. Aortic valve regurgitation is trivial. Severe aortic valve stenosis. FINDINGS  Left Ventricle: Left ventricular ejection fraction, by estimation, is 70 to 75%. The left ventricle has normal function. The left ventricle has no regional wall motion abnormalities. The left ventricular internal cavity size was normal in size. There is  mild left ventricular hypertrophy. Left ventricular diastolic parameters were normal. Right Ventricle: The right ventricular size is mildly enlarged. No increase in right ventricular wall thickness. Right ventricular systolic function is normal. There is severely elevated pulmonary artery systolic pressure. The tricuspid regurgitant velocity is 4.13 m/s, and with an assumed right atrial pressure of 10 mmHg, the estimated right ventricular systolic pressure is 50.9 mmHg. Left Atrium: Left atrial size was normal in size. Right Atrium: Right atrial size was mildly dilated. Pericardium: There is no evidence of pericardial effusion. Mitral Valve: The mitral valve is grossly normal. Mild mitral valve regurgitation. Tricuspid Valve: The tricuspid valve is grossly normal. Tricuspid valve regurgitation is moderate. Aortic Valve: The aortic valve was not well visualized. Aortic valve regurgitation is trivial. Severe aortic stenosis is present. Aortic valve mean gradient measures 52.0 mmHg. Aortic valve peak gradient measures 81.5 mmHg. Aortic valve area, by VTI measures 0.46 cm. Pulmonic Valve: The pulmonic valve was not well visualized. Pulmonic valve regurgitation is not visualized. Aorta: The aortic root was not well visualized. IAS/Shunts: The interatrial septum was not assessed.  LEFT VENTRICLE PLAX 2D LVIDd:         3.55 cm LVIDs:         1.90 cm LV  PW:         1.40 cm LV IVS:        0.91 cm LVOT diam:     1.90 cm LV SV:         46 LV SV Index:   31 LVOT Area:     2.84 cm  RIGHT VENTRICLE RV Basal diam:  4.23 cm RV S prime:     8.81 cm/s TAPSE (M-mode): 2.5 cm LEFT ATRIUM  Index       RIGHT ATRIUM           Index LA diam:      3.60 cm 2.45 cm/m  RA Area:     18.70 cm LA Vol (A2C): 42.5 ml 28.96 ml/m RA Volume:   62.10 ml  42.31 ml/m LA Vol (A4C): 49.1 ml 33.45 ml/m  AORTIC VALVE                    PULMONIC VALVE AV Area (Vmax):    0.47 cm     PV Vmax:        0.85 m/s AV Area (Vmean):   0.47 cm     PV Peak grad:   2.9 mmHg AV Area (VTI):     0.46 cm     RVOT Peak grad: 3 mmHg AV Vmax:           451.33 cm/s AV Vmean:          341.000 cm/s AV VTI:            1.001 m AV Peak Grad:      81.5 mmHg AV Mean Grad:      52.0 mmHg LVOT Vmax:         75.10 cm/s LVOT Vmean:        56.800 cm/s LVOT VTI:          0.161 m LVOT/AV VTI ratio: 0.16  AORTA Ao Root diam: 2.20 cm MITRAL VALVE                TRICUSPID VALVE MV Area (PHT): 2.92 cm     TR Peak grad:   68.2 mmHg MV Decel Time: 260 msec     TR Vmax:        413.00 cm/s MV E velocity: 136.00 cm/s                             SHUNTS                             Systemic VTI:  0.16 m                             Systemic Diam: 1.90 cm Bartholome Bill MD Electronically signed by Bartholome Bill MD Signature Date/Time: 03/30/2020/8:32:04 AM    Final    DG HIP OPERATIVE UNILAT W OR W/O PELVIS RIGHT  Result Date: 03/22/2020 CLINICAL DATA:  Right proximal femur fracture. EXAM: OPERATIVE right HIP (WITH PELVIS IF PERFORMED) 6 VIEWS TECHNIQUE: Fluoroscopic spot image(s) were submitted for interpretation post-operatively. FLUOROSCOPY TIME:  2 minutes 5 seconds. COMPARISON:  March 21, 2020. FINDINGS: Six intraoperative fluoroscopic images were obtained of the right hip and femur. These images demonstrate surgical internal fixation of proximal right femoral fracture with intramedullary rod fixation of the right femoral  shaft. Improved alignment of fracture components is noted. IMPRESSION: Status post surgical internal fixation of proximal right femoral fracture. Electronically Signed   By: Marijo Conception M.D.   On: 03/22/2020 13:58   DG Femur Min 2 Views Right  Result Date: 03/21/2020 CLINICAL DATA:  Preoperative study EXAM: RIGHT FEMUR 2 VIEWS COMPARISON:  None. FINDINGS: A displaced comminuted intertrochanteric fracture seen in the right hip. The remainder of the femur is intact. The proximal tibia and fibula are intact. No femoral head  dislocation. A fracture through the left inferior pubic ramus is not excluded. No other acute abnormalities. IMPRESSION: 1. The patient has a displaced comminuted fracture through the right femoral intertrochanteric region without dislocation. 2. A nondisplaced left inferior pubic ramus fracture is not excluded. 3. No other abnormalities. Electronically Signed   By: Dorise Bullion III M.D   On: 03/21/2020 09:11    DISCHARGE EXAMINATION: General appearance: Awake alert.  In no distress Resp: Clear to auscultation bilaterally.  Normal effort Cardio: S1-S2 is normal regular.  No S3-S4.  No rubs murmurs or bruit GI: Abdomen is soft.  Nontender nondistended.  Bowel sounds are present normal.  No masses organomegaly Foley catheter is present.   DISPOSITION: SNF in [place]  Discharge Instructions    Call MD for:  difficulty breathing, headache or visual disturbances   Complete by: As directed    Call MD for:  extreme fatigue   Complete by: As directed    Call MD for:  persistant dizziness or light-headedness   Complete by: As directed    Call MD for:  persistant nausea and vomiting   Complete by: As directed    Call MD for:  severe uncontrolled pain   Complete by: As directed    Call MD for:  temperature >100.4   Complete by: As directed    Discharge instructions   Complete by: As directed    Please review instructions on the discharge summary.  You were cared for by  a hospitalist during your hospital stay. If you have any questions about your discharge medications or the care you received while you were in the hospital after you are discharged, you can call the unit and asked to speak with the hospitalist on call if the hospitalist that took care of you is not available. Once you are discharged, your primary care physician will handle any further medical issues. Please note that NO REFILLS for any discharge medications will be authorized once you are discharged, as it is imperative that you return to your primary care physician (or establish a relationship with a primary care physician if you do not have one) for your aftercare needs so that they can reassess your need for medications and monitor your lab values. If you do not have a primary care physician, you can call [phone] for a physician referral.   Increase activity slowly   Complete by: As directed    No wound care   Complete by: As directed         Allergies as of 04/02/2020      Reactions   Codeine    With codeine      Medication List    STOP taking these medications   losartan 100 MG tablet Commonly known as: COZAAR     TAKE these medications   acetaminophen 500 MG tablet Commonly known as: TYLENOL Take 500 mg by mouth every 6 (six) hours as needed for headache.   calcium acetate 667 MG capsule Commonly known as: PHOSLO Take 667 mg by mouth 2 (two) times daily.   cetirizine 10 MG tablet Commonly known as: ZYRTEC Take 10 mg by mouth daily at 6 (six) AM.   docusate sodium 100 MG capsule Commonly known as: COLACE Take 1 capsule (100 mg total) by mouth 2 (two) times daily.   Eliquis 2.5 MG Tabs tablet Generic drug: apixaban Take 2.5 mg by mouth 2 (two) times daily.   feeding supplement (ENSURE ENLIVE) Liqd Take 237 mLs by mouth 3 (  three) times daily between meals.   levothyroxine 75 MCG tablet Commonly known as: SYNTHROID Take 75 mcg by mouth daily.   metoprolol succinate  25 MG 24 hr tablet Commonly known as: TOPROL-XL Take 1 tablet (25 mg total) by mouth daily. What changed:   medication strength  how much to take   multivitamin capsule Take 1 capsule by mouth daily.   oxyCODONE 5 MG immediate release tablet Commonly known as: Oxy IR/ROXICODONE Take 0.5-1 tablets (2.5-5 mg total) by mouth every 4 (four) hours as needed for moderate pain or severe pain (pain score 4-6).   polyethylene glycol 17 g packet Commonly known as: MIRALAX / GLYCOLAX Take 17 g by mouth 2 (two) times daily.   senna-docusate 8.6-50 MG tablet Commonly known as: Senokot-S Take 2 tablets by mouth daily.   torsemide 10 MG tablet Commonly known as: DEMADEX Take 1 tablet (10 mg total) by mouth daily.   traMADol 50 MG tablet Commonly known as: ULTRAM Take 1 tablet (50 mg total) by mouth every 6 (six) hours as needed for moderate pain.          TOTAL DISCHARGE TIME: 35 minutes  Kimiye Strathman Sealed Air [employer] on [email or web address]  04/02/2020, 10:12 AM

## 2020-04-02 NOTE — TOC Progression Note (Signed)
Transition of Care [facility]) - Progression Note    Patient Details  Name: Rebecca Cruz MRN: [id number] Date of Birth: 29-Dec-1923  Transition of Care [facility]) CM/SW Contact  Su Hilt, RN Phone Number: 04/02/2020, 11:01 AM  Clinical Narrative:    Patient was picked up by air transport to go to [facility] in [place], [place] packet with DC summary sent with the patient, DNR in the chart and scripts signed        Expected Discharge Plan and Services           Expected Discharge Date: 04/02/20                                     Social Determinants of Health (SDOH) Interventions    Readmission Risk Interventions No flowsheet data found.

## 2020-04-02 NOTE — Progress Notes (Signed)
Pt left the floor via stretcher by EMS.  VS was stable, pt left with foley intact. DC pocket was given to EMS personnel including report.

## 2020-04-02 NOTE — TOC Progression Note (Signed)
Transition of Care [facility]) - Progression Note    Patient Details  Name: Rebecca Cruz MRN: [id number] Date of Birth: 1924-03-23  Transition of Care [facility]) CM/SW Contact  Su Hilt, RN Phone Number: 04/02/2020, 10:22 AM  Clinical Narrative:    Bedside nurse called report to the transport Nurse, DC packet is on the chart including the DNR and RX script, Son Eddie Dibbles  Is aware and the transport team will be here to pick up around 1030,         Expected Discharge Plan and Services           Expected Discharge Date: 04/02/20                                     Social Determinants of Health (SDOH) Interventions    Readmission Risk Interventions No flowsheet data found.

## 2020-04-02 NOTE — Plan of Care (Signed)
Pt anticipates discharge today. Problem: Education: Goal: Knowledge of General Education information will improve Description: Including pain rating scale, medication(s)/side effects and non-pharmacologic comfort measures Outcome: Progressing   Problem: Health Behavior/Discharge Planning: Goal: Ability to manage health-related needs will improve Outcome: Progressing   Problem: Clinical Measurements: Goal: Ability to maintain clinical measurements within normal limits will improve Outcome: Progressing Goal: Will remain free from infection Outcome: Progressing Goal: Diagnostic test results will improve Outcome: Progressing Goal: Respiratory complications will improve Outcome: Progressing Goal: Cardiovascular complication will be avoided Outcome: Progressing   Problem: Activity: Goal: Risk for activity intolerance will decrease Outcome: Progressing   Problem: Nutrition: Goal: Adequate nutrition will be maintained Outcome: Progressing   Problem: Coping: Goal: Level of anxiety will decrease Outcome: Progressing   Problem: Elimination: Goal: Will not experience complications related to bowel motility Outcome: Progressing Goal: Will not experience complications related to urinary retention Outcome: Progressing   Problem: Pain Managment: Goal: General experience of comfort will improve Outcome: Progressing   Problem: Safety: Goal: Ability to remain free from injury will improve Outcome: Progressing   Problem: Skin Integrity: Goal: Risk for impaired skin integrity will decrease Outcome: Progressing   Problem: Education: Goal: Knowledge of the prescribed therapeutic regimen will improve Outcome: Progressing Goal: Understanding of discharge needs will improve Outcome: Progressing Goal: Individualized Educational Video(s) Outcome: Progressing   Problem: Activity: Goal: Ability to avoid complications of mobility impairment will improve Outcome: Progressing Goal:  Ability to tolerate increased activity will improve Outcome: Progressing   Problem: Clinical Measurements: Goal: Postoperative complications will be avoided or minimized Outcome: Progressing   Problem: Pain Management: Goal: Pain level will decrease with appropriate interventions Outcome: Progressing   Problem: Skin Integrity: Goal: Will show signs of wound healing Outcome: Progressing   Problem: Education: Goal: Knowledge of the prescribed therapeutic regimen will improve Outcome: Progressing   Problem: Coping: Goal: Ability to identify and develop effective coping behavior will improve Outcome: Progressing   Problem: Clinical Measurements: Goal: Quality of life will improve Outcome: Progressing   Problem: Respiratory: Goal: Verbalizations of increased ease of respirations will increase Outcome: Progressing   Problem: Role Relationship: Goal: Family's ability to cope with current situation will improve Outcome: Progressing Goal: Ability to verbalize concerns, feelings, and thoughts to partner or family member will improve Outcome: Progressing   Problem: Pain Management: Goal: Satisfaction with pain management regimen will improve Outcome: Progressing   Problem: Education: Goal: Knowledge of disease and its progression will improve Outcome: Progressing Goal: Individualized Educational Video(s) Outcome: Progressing   Problem: Fluid Volume: Goal: Compliance with measures to maintain balanced fluid volume will improve Outcome: Progressing   Problem: Health Behavior/Discharge Planning: Goal: Ability to manage health-related needs will improve Outcome: Progressing   Problem: Nutritional: Goal: Ability to make healthy dietary choices will improve Outcome: Progressing   Problem: Clinical Measurements: Goal: Complications related to the disease process, condition or treatment will be avoided or minimized Outcome: Progressing

## 2021-01-23 DEATH — deceased

## 2021-10-09 IMAGING — CR DG PELVIS 1-2V
1 series · 1 of 1 positions shown · non-contrast
Comparison: None.

CLINICAL DATA: Pain after fall

EXAM:
PELVIS - 1-2 VIEW

[pelvis ap]
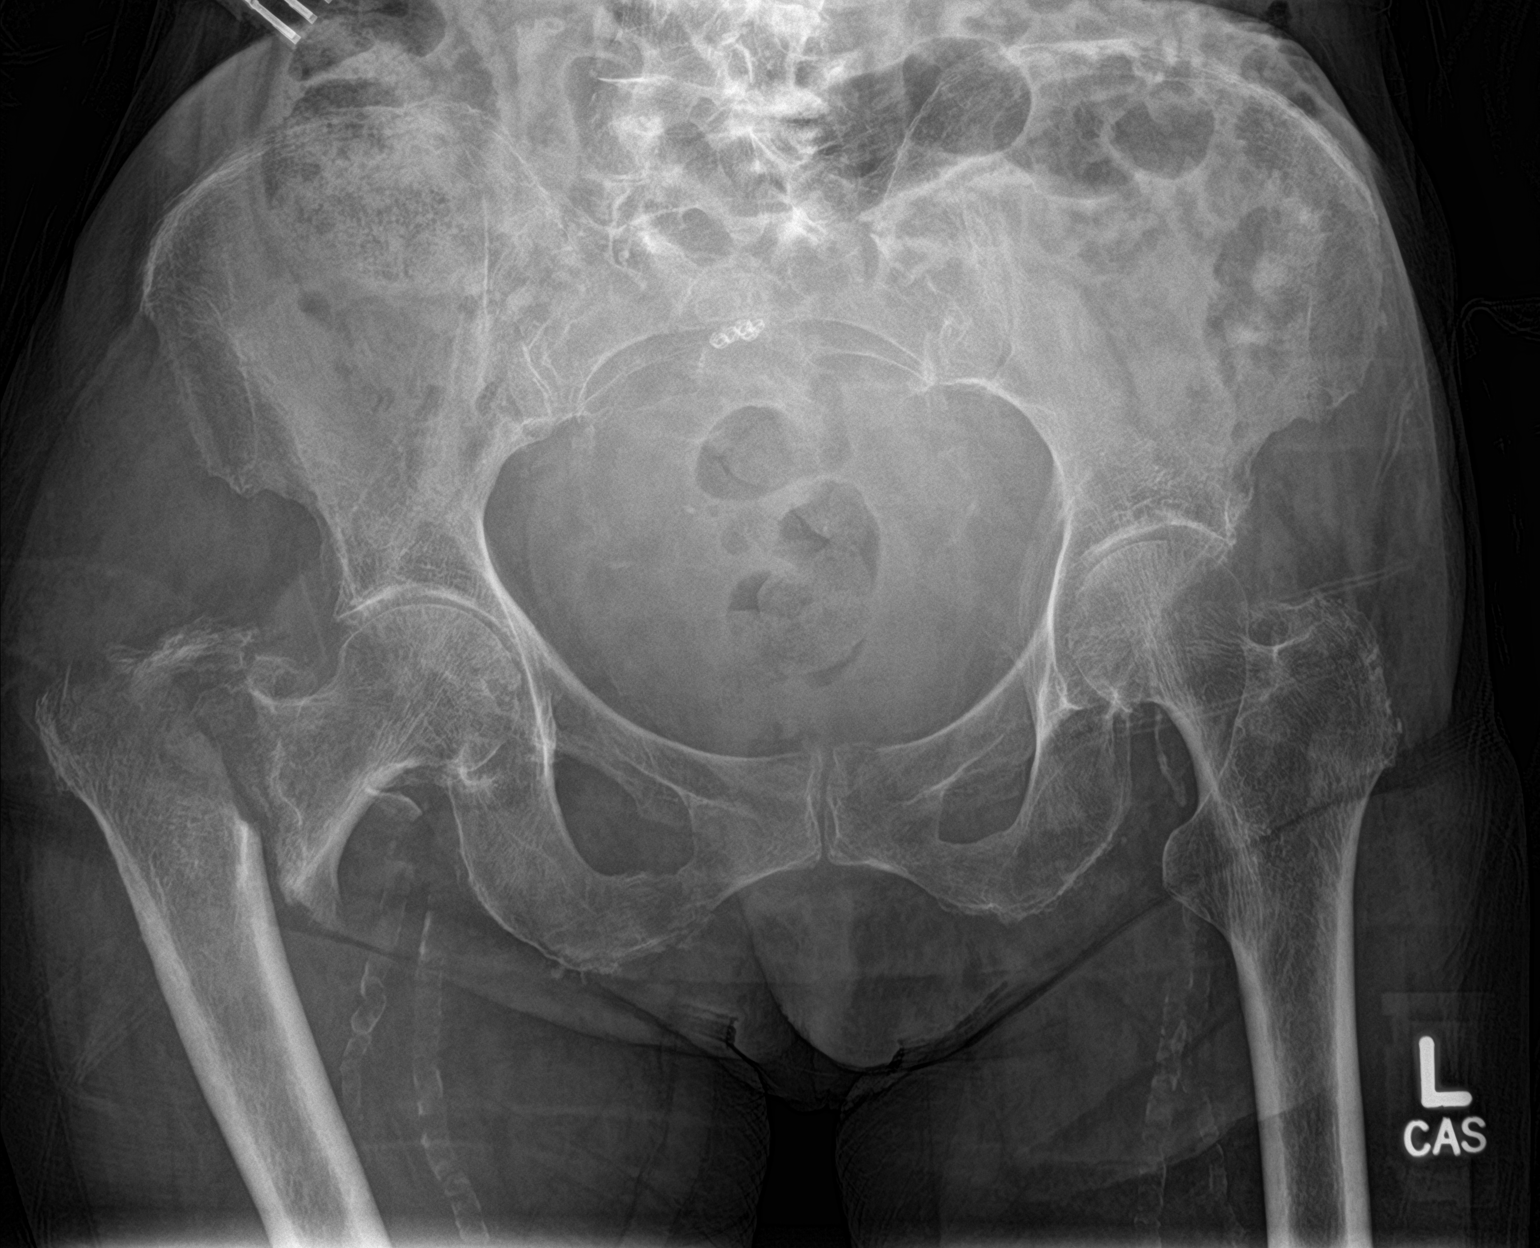

[1 of 1 positions shown; findings below may reference images not displayed]

FINDINGS: A comminuted displaced fracture through the right femoral
intertrochanteric region is identified. A nondisplaced left inferior
pubic ramus fracture is not excluded. No other bony abnormalities in
the pelvic bones. Vascular calcifications.
IMPRESSION: 1. Displaced comminuted fracture through the right femoral
intertrochanteric region. A nondisplaced fracture through the right
inferior pubic ramus is not excluded.

## 2021-10-09 IMAGING — CR DG CHEST 1V
1 series · 1 of 1 positions shown · non-contrast
Comparison: None.

CLINICAL DATA: Preoperative study

EXAM:
CHEST  1 VIEW

[chest ap]
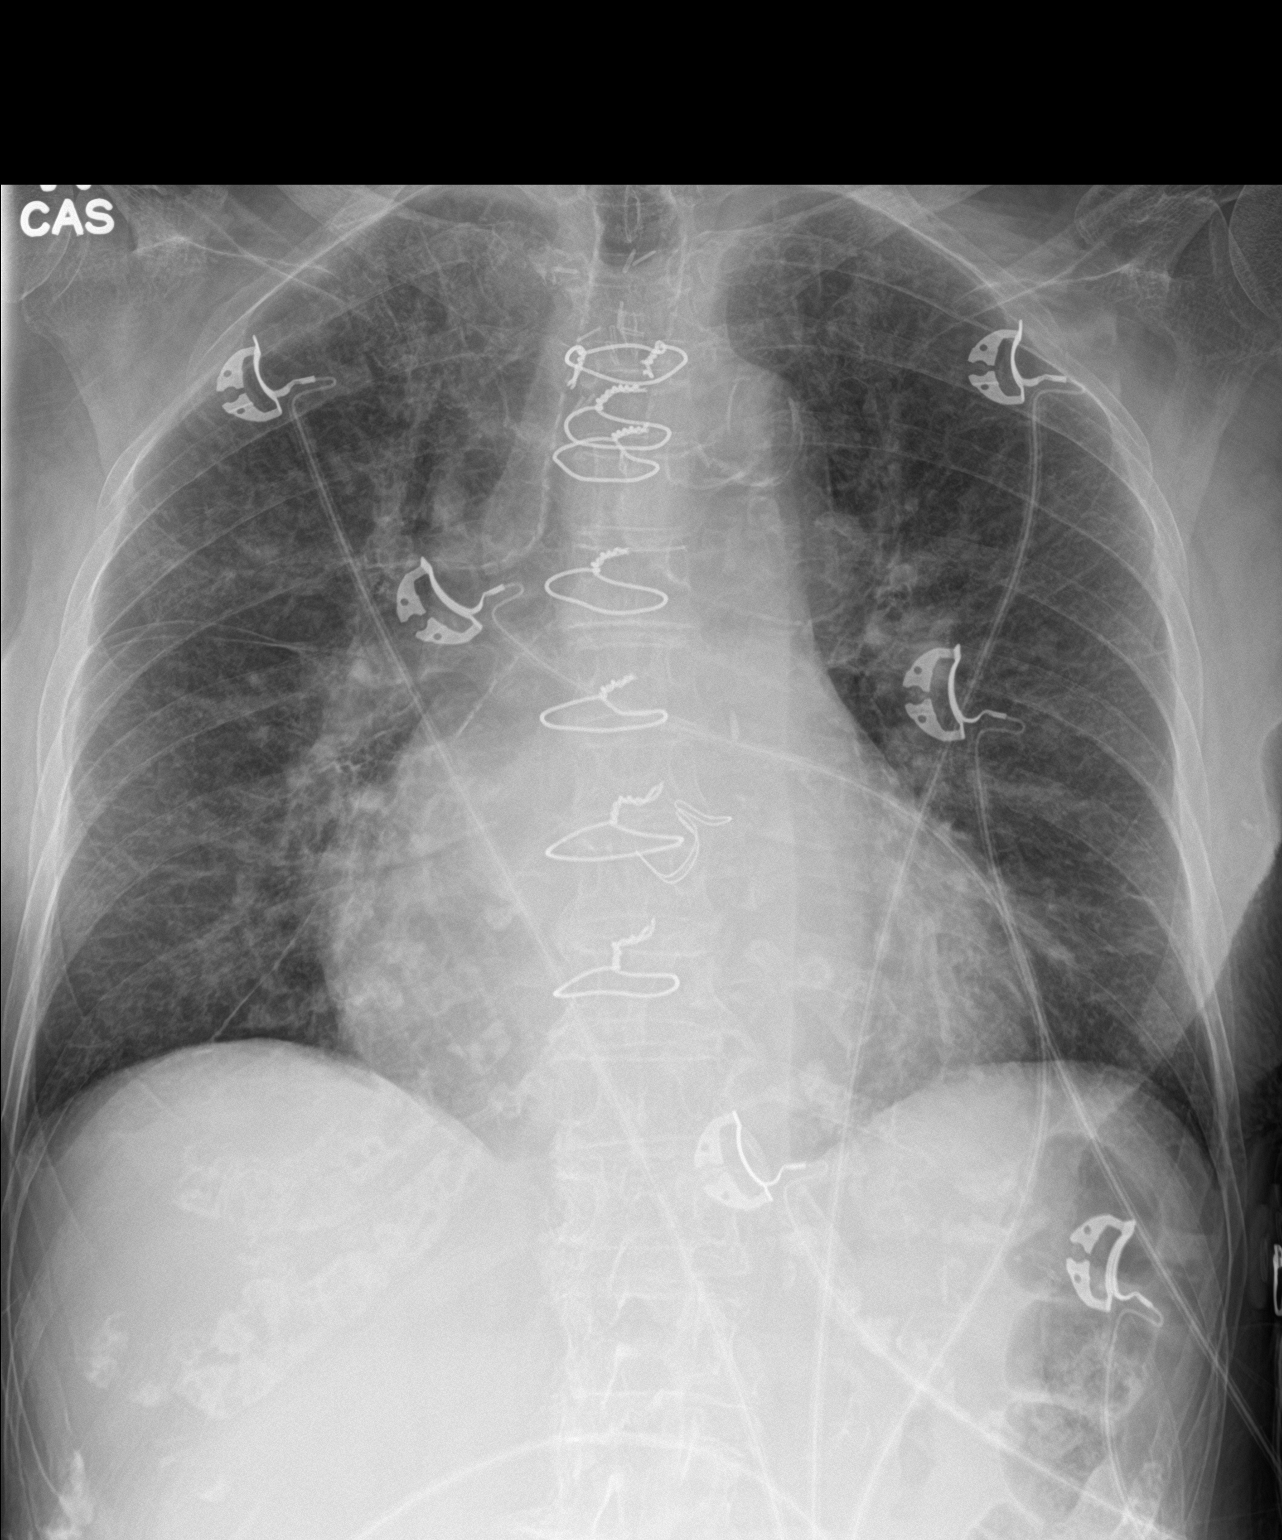

[1 of 1 positions shown; findings below may reference images not displayed]

FINDINGS: Cardiomegaly. Diffuse interstitial opacities and mild Kerley B
lines. No pneumothorax. No nodules or masses. No focal infiltrates.
IMPRESSION: Cardiomegaly and mild edema.

## 2021-10-09 IMAGING — CT CT HEAD W/O CM
3 series · 15 of 47 positions shown, 18 images · non-contrast
Comparison: None.

CLINICAL DATA: Pt arrives [facility] from [facility]. Pt visiting from
[place]. Uses cane/walked. States she lost her balance and fell on
floor inside house. Swelling noted to R upper thigh. Internal
rotation to R leg. Shortening as well. A&. TKV

EXAM:
CT HEAD WITHOUT CONTRAST
TECHNIQUE: Contiguous axial images were obtained from the base of the skull
through the vertex without intravenous contrast.

[Series 3: head wo · axial · 0.38mm/px · z∈[-151,-26]mm · 9 of 30 slices shown, 12 images]
[im 3/30  brain]
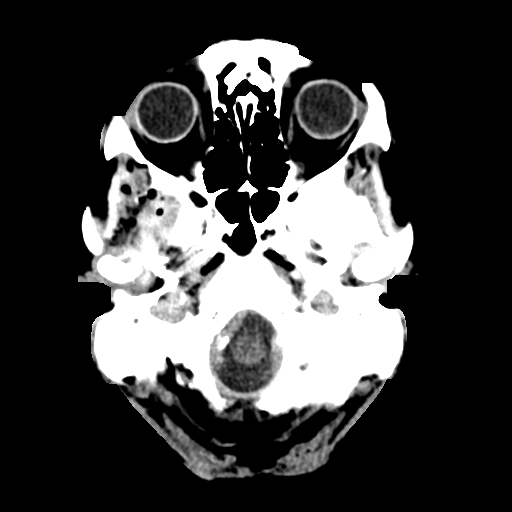
[im 3/30  bone]
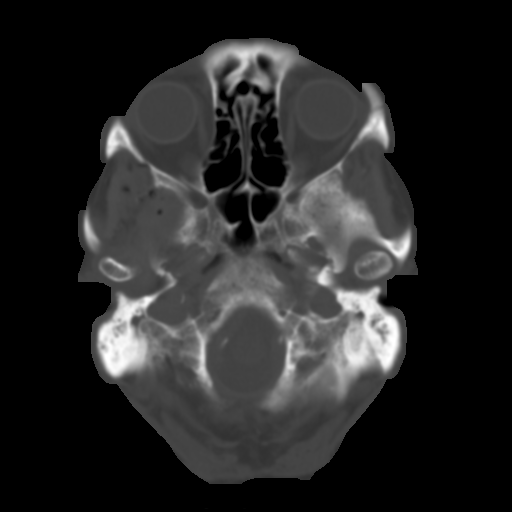
[im 6/30  brain]
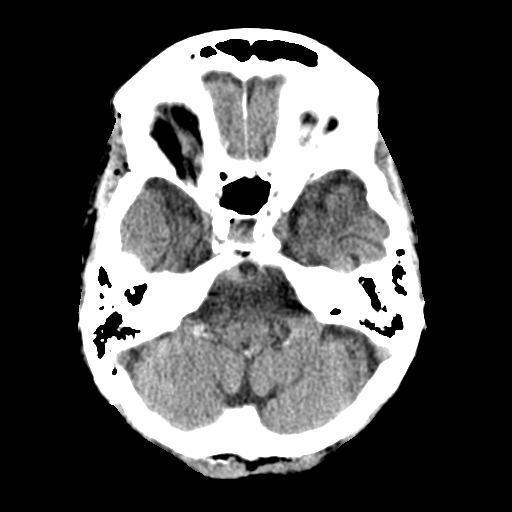
[im 9/30  brain]
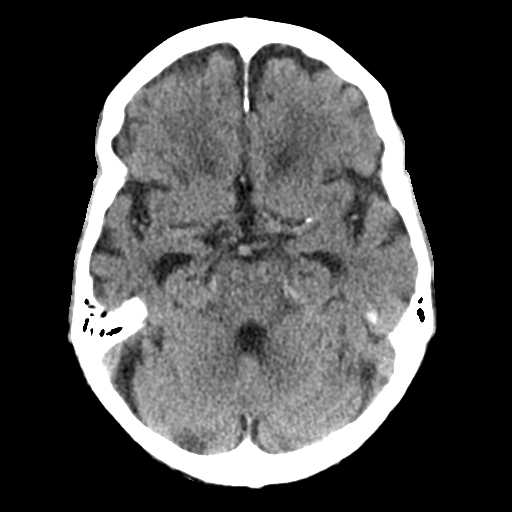
[im 12/30  brain]
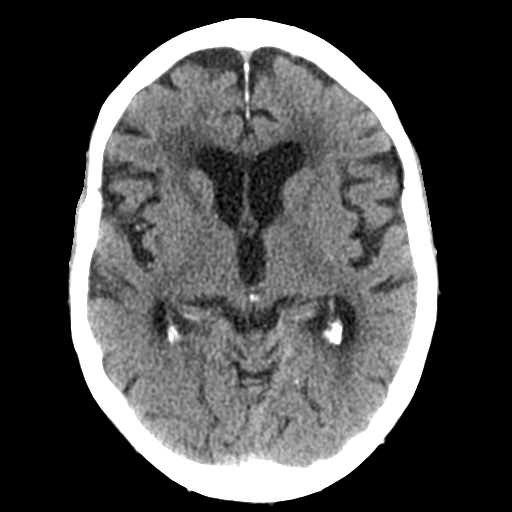
[im 16/30  brain]
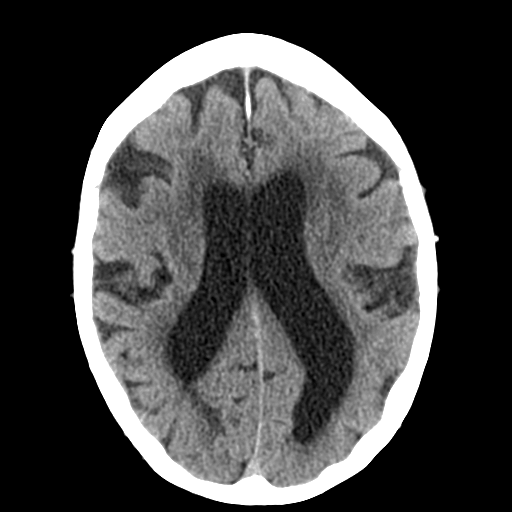
[im 16/30  bone]
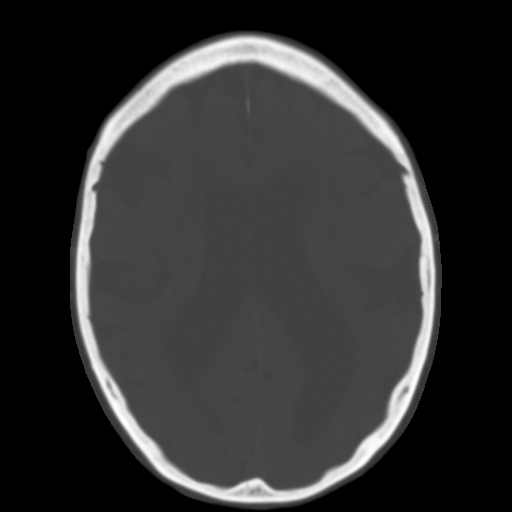
[im 19/30  brain]
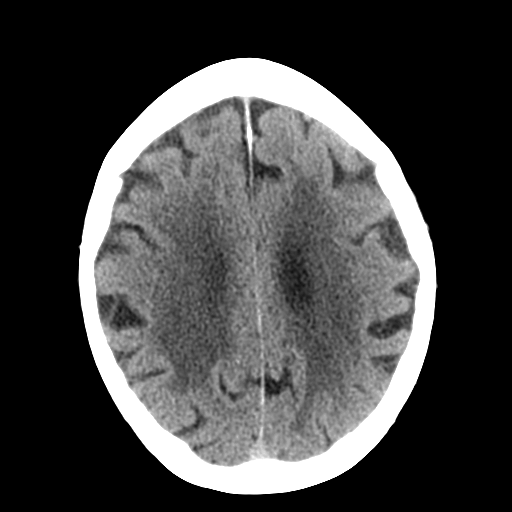
[im 22/30  brain]
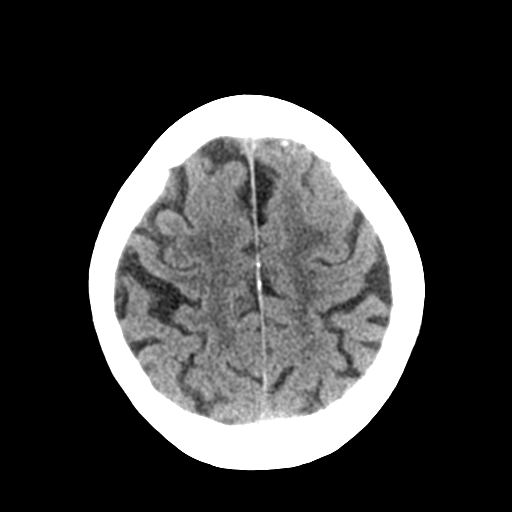
[im 25/30  brain]
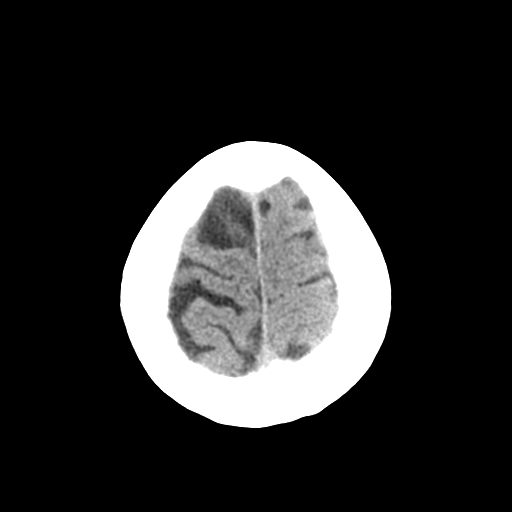
[im 28/30  brain]
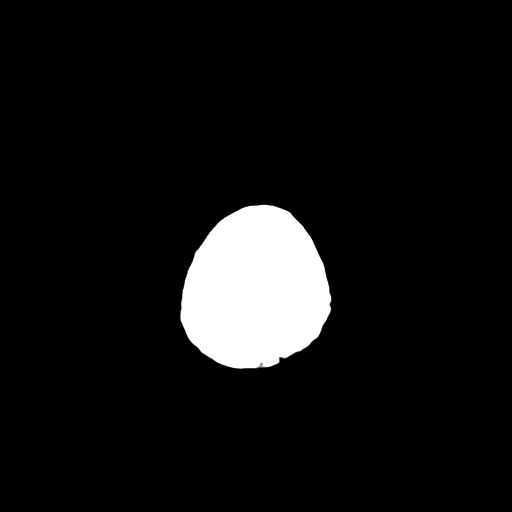
[im 28/30  bone]
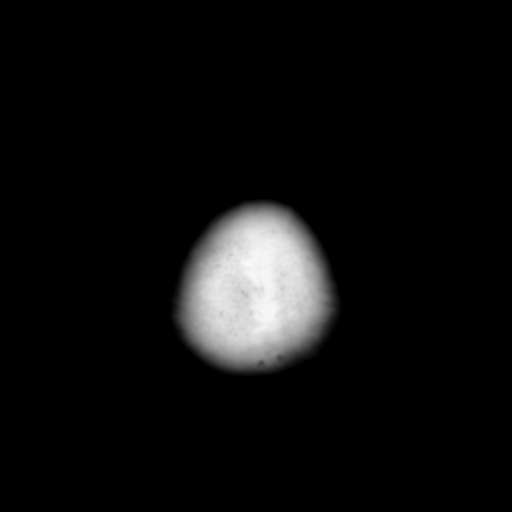

[Series 4: coronal soft tissue · coronal · 0.31mm/px · 3 of 65 slices shown]
[im 22/65  brain]
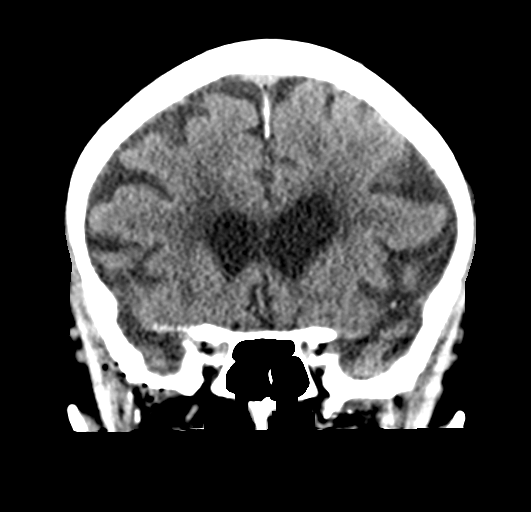
[im 29/65  brain]
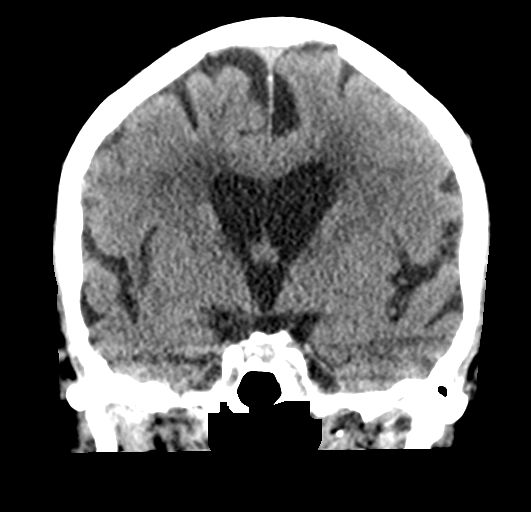
[im 36/65  brain]
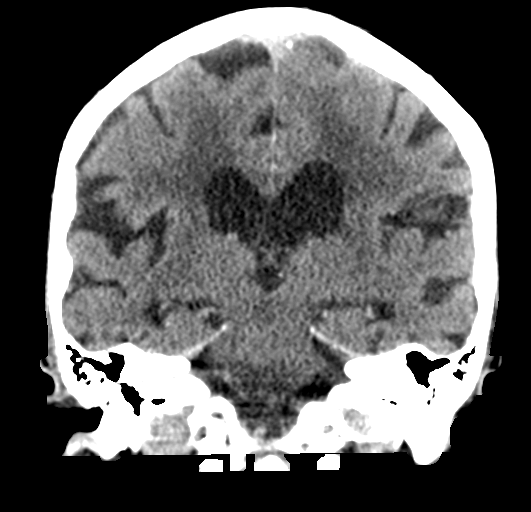

[Series 5: sagittal soft tissue · sagittal · 0.31mm/px · 3 of 52 slices shown]
[im 18/52  brain]
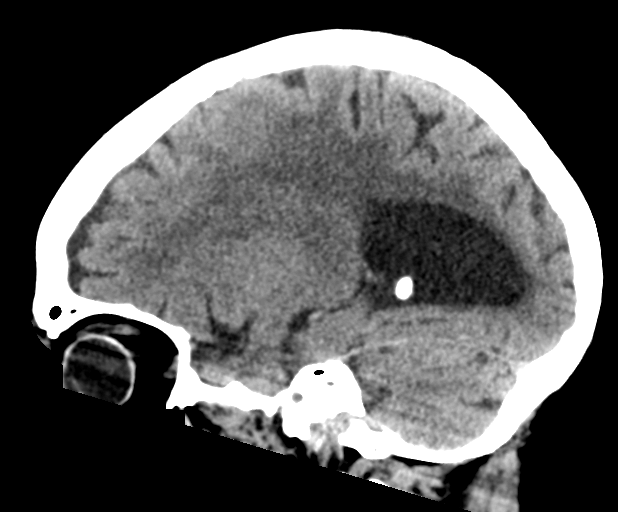
[im 26/52  brain]
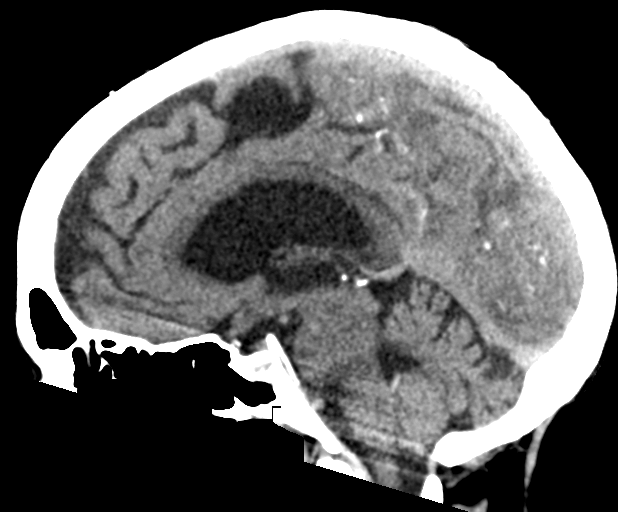
[im 35/52  brain]
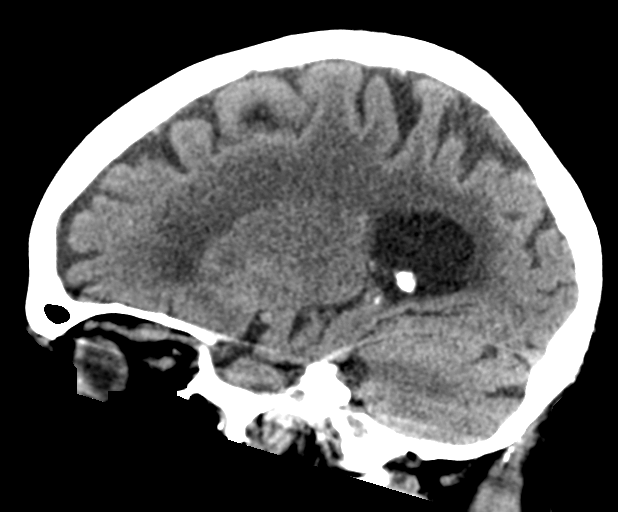

[15 of 47 positions shown; findings below may reference images not displayed]

FINDINGS: Brain: No evidence of acute infarction, hemorrhage, hydrocephalus,
extra-axial collection or mass lesion/mass effect.

There is ventricular sulcal enlargement reflecting mild diffuse
atrophy. Patchy bilateral white matter hypoattenuation is also noted
consistent with chronic microvascular ischemic change.

Vascular: No hyperdense vessel or unexpected calcification.

Skull: Normal. Negative for fracture or focal lesion.

Sinuses/Orbits: Visualized globes and orbits are unremarkable. The
visualized sinuses are clear.

Other: There is a small amount of soft tissue air below the skull
base, projecting adjacent to the right pterygoid plates, posterior
to the right maxillary sinus and in the infratemporal fossa. The
etiology of this is unclear.
IMPRESSION: 1. No acute intracranial abnormalities.
2. Mild atrophy and moderate chronic microvascular ischemic change.
3. Small amount of soft tissue air at the right skull base as
detailed above. Consider a facial fracture, follow-up facial CT, if
there is facial trauma.
